# Patient Record
Sex: Female | Born: 1950
Health system: Southern US, Community
[De-identification: ages and names within clinical notes are randomized; demographics above are authoritative.]

## PROBLEM LIST (undated history)

## (undated) DIAGNOSIS — L8990 Pressure ulcer of unspecified site, unspecified stage: Secondary | ICD-10-CM

## (undated) DIAGNOSIS — M722 Plantar fascial fibromatosis: Secondary | ICD-10-CM

## (undated) DIAGNOSIS — M069 Rheumatoid arthritis, unspecified: Secondary | ICD-10-CM

## (undated) DIAGNOSIS — K219 Gastro-esophageal reflux disease without esophagitis: Secondary | ICD-10-CM

## (undated) DIAGNOSIS — I872 Venous insufficiency (chronic) (peripheral): Secondary | ICD-10-CM

## (undated) DIAGNOSIS — J342 Deviated nasal septum: Secondary | ICD-10-CM

## (undated) DIAGNOSIS — H11152 Pinguecula, left eye: Secondary | ICD-10-CM

## (undated) DIAGNOSIS — N390 Urinary tract infection, site not specified: Secondary | ICD-10-CM

## (undated) DIAGNOSIS — I10 Essential (primary) hypertension: Secondary | ICD-10-CM

## (undated) DIAGNOSIS — N289 Disorder of kidney and ureter, unspecified: Secondary | ICD-10-CM

## (undated) DIAGNOSIS — H15113 Episcleritis periodica fugax, bilateral: Secondary | ICD-10-CM

## (undated) DIAGNOSIS — M4690 Unspecified inflammatory spondylopathy, site unspecified: Secondary | ICD-10-CM

## (undated) HISTORY — DX: Episcleritis periodica fugax, bilateral: H15.113

## (undated) HISTORY — DX: Rheumatoid arthritis, unspecified: M06.9

## (undated) HISTORY — PX: OTHER SURGICAL HISTORY: SHX169

## (undated) HISTORY — DX: Disorder of kidney and ureter, unspecified: N28.9

## (undated) HISTORY — DX: Plantar fascial fibromatosis: M72.2

## (undated) HISTORY — DX: Unspecified inflammatory spondylopathy, site unspecified: M46.90

## (undated) HISTORY — DX: Venous insufficiency (chronic) (peripheral): I87.2

## (undated) HISTORY — DX: Pressure ulcer of unspecified site, unspecified stage: L89.90

## (undated) HISTORY — DX: Pinguecula, left eye: H11.152

## (undated) HISTORY — PX: BIOPSY BREAST: PRO8

---

## 2013-02-24 ENCOUNTER — Encounter (HOSPITAL_COMMUNITY): Payer: Self-pay | Admitting: Pharmacy Technician

## 2013-03-01 ENCOUNTER — Other Ambulatory Visit (HOSPITAL_COMMUNITY): Payer: Self-pay | Admitting: Orthopedic Surgery

## 2013-03-01 NOTE — Patient Instructions (Addendum)
[place]  03/01/2013   Your procedure is scheduled on: Tuesday January 13th  Report to [facility] at 1235  pm  Call this number if you have problems the morning of surgery [phone]   Remember:   Do not eat food  :After Midnight.   clear liquids midnight night before surgery until 935 am day of surgery, then nothing by mouth   Take these medicines the morning of surgery with A SIP OF WATER:  No meds to take                                [place] may not have any metal on your body including hair pins and piercings  Do not wear jewelry, make-up.  Do not wear lotions, powders, or perfumes. You may wear deodorant.   Men may shave face and neck.  Do not bring valuables to the hospital. [place].  Contacts, dentures or bridgework may not be worn into surgery.  Leave suitcase in the car. After surgery it may be brought to your room.  For patients admitted to the hospital, checkout time is 11:00 AM the day of discharge.     Please read over the following fact sheets that you were given: mrsa information, blood fact sheet, clear liquid sheet, incentive spirometer sheet  Call Zelphia Cairo RN pre op nurse if needed [phone]    [place].  PATIENT SIGNATURE___________________________________________  NURSE SIGNATURE_____________________________________________

## 2013-03-02 ENCOUNTER — Encounter (HOSPITAL_COMMUNITY)
Admission: RE | Admit: 2013-03-02 | Discharge: 2013-03-02 | Disposition: A | Discharge status: Home / Self Care | Payer: BC Managed Care – PPO | Source: Ambulatory Visit | Attending: Orthopedic Surgery | Admitting: Orthopedic Surgery

## 2013-03-02 ENCOUNTER — Ambulatory Visit (HOSPITAL_COMMUNITY)
Admission: RE | Admit: 2013-03-02 | Discharge: 2013-03-02 | Disposition: A | Discharge status: Home / Self Care | Payer: BC Managed Care – PPO | Source: Ambulatory Visit | Attending: Orthopedic Surgery | Admitting: Orthopedic Surgery

## 2013-03-02 ENCOUNTER — Encounter (HOSPITAL_COMMUNITY): Payer: Self-pay

## 2013-03-02 DIAGNOSIS — I10 Essential (primary) hypertension: Secondary | ICD-10-CM | POA: Insufficient documentation

## 2013-03-02 DIAGNOSIS — M47814 Spondylosis without myelopathy or radiculopathy, thoracic region: Secondary | ICD-10-CM | POA: Insufficient documentation

## 2013-03-02 DIAGNOSIS — Z01810 Encounter for preprocedural cardiovascular examination: Secondary | ICD-10-CM | POA: Insufficient documentation

## 2013-03-02 DIAGNOSIS — R918 Other nonspecific abnormal finding of lung field: Secondary | ICD-10-CM | POA: Insufficient documentation

## 2013-03-02 DIAGNOSIS — Z01818 Encounter for other preprocedural examination: Secondary | ICD-10-CM | POA: Insufficient documentation

## 2013-03-02 DIAGNOSIS — Z01812 Encounter for preprocedural laboratory examination: Secondary | ICD-10-CM | POA: Insufficient documentation

## 2013-03-02 DIAGNOSIS — N390 Urinary tract infection, site not specified: Secondary | ICD-10-CM

## 2013-03-02 HISTORY — DX: Urinary tract infection, site not specified: N39.0

## 2013-03-02 HISTORY — DX: Deviated nasal septum: J34.2

## 2013-03-02 HISTORY — DX: Gastro-esophageal reflux disease without esophagitis: K21.9

## 2013-03-02 HISTORY — DX: Essential (primary) hypertension: I10

## 2013-03-02 LAB — BASIC METABOLIC PANEL
BUN: 38 mg/dL — ABNORMAL HIGH (ref 6–23)
CALCIUM: 9.6 mg/dL (ref 8.4–10.5)
CO2: 24 mEq/L (ref 19–32)
CREATININE: 1.41 mg/dL — AB (ref 0.50–1.10)
Chloride: 102 mEq/L (ref 96–112)
GFR calc Af Amer: 45 mL/min — ABNORMAL LOW (ref >90)
GFR, EST NON AFRICAN AMERICAN: 39 mL/min — AB (ref >90)
Glucose, Bld: 108 mg/dL — ABNORMAL HIGH (ref 70–99)
Potassium: 4.9 mEq/L (ref 3.7–5.3)
SODIUM: 139 meq/L (ref 137–147)

## 2013-03-02 LAB — CBC
HCT: 33.6 % — ABNORMAL LOW (ref 36.0–46.0)
Hemoglobin: 10.7 g/dL — ABNORMAL LOW (ref 12.0–15.0)
MCH: 28 pg (ref 26.0–34.0)
MCHC: 31.8 g/dL (ref 30.0–36.0)
MCV: 88 fL (ref 78.0–100.0)
PLATELETS: 548 10*3/uL — AB (ref 150–400)
RBC: 3.82 MIL/uL — ABNORMAL LOW (ref 3.87–5.11)
RDW: 14.4 % (ref 11.5–15.5)
WBC: 12.7 10*3/uL — AB (ref 4.0–10.5)

## 2013-03-02 LAB — URINALYSIS, ROUTINE W REFLEX MICROSCOPIC
Bilirubin Urine: NEGATIVE
Glucose, UA: NEGATIVE mg/dL
Hgb urine dipstick: NEGATIVE
KETONES UR: NEGATIVE mg/dL
NITRITE: NEGATIVE
PH: 7 (ref 5.0–8.0)
PROTEIN: NEGATIVE mg/dL
Specific Gravity, Urine: 1.02 (ref 1.005–1.030)
Urobilinogen, UA: 0.2 mg/dL (ref 0.0–1.0)

## 2013-03-02 LAB — SURGICAL PCR SCREEN
MRSA, PCR: NEGATIVE
Staphylococcus aureus: NEGATIVE

## 2013-03-02 LAB — ABO/RH: ABO/RH(D): O POS

## 2013-03-02 LAB — URINE MICROSCOPIC-ADD ON

## 2013-03-02 LAB — PROTIME-INR
INR: 1.06 (ref 0.00–1.49)
Prothrombin Time: 13.6 seconds (ref 11.6–15.2)

## 2013-03-02 LAB — APTT: aPTT: 30 seconds (ref 24–37)

## 2013-03-02 NOTE — Progress Notes (Signed)
03/02/13 0924  OBSTRUCTIVE SLEEP APNEA  Have you ever been diagnosed with sleep apnea through a sleep study? No  Do you snore loudly (loud enough to be heard through closed doors)?  0  Do you often feel tired, fatigued, or sleepy during the daytime? 0  Has anyone observed you stop breathing during your sleep? 0  Do you have, or are you being treated for high blood pressure? 1  BMI more than 35 kg/m2? 1  Age over 64 years old? 1  Neck circumference greater than 40 cm/18 inches? 0  Gender: 1  Obstructive Sleep Apnea Score 4  Score 4 or greater  Results sent to PCP

## 2013-03-02 NOTE — Progress Notes (Signed)
Fax received and placed on pt chart cipro 500 mg x 3 days called in to [facility]

## 2013-03-02 NOTE — Progress Notes (Signed)
Micro, ua, bmet results faxed to dr Alvan Dame by epic

## 2013-03-05 ENCOUNTER — Encounter (HOSPITAL_COMMUNITY): Payer: Self-pay

## 2013-03-05 NOTE — Progress Notes (Addendum)
Medical clearance note d [name] [name]  on chart for 03-09-12 surgery, pt aware surgery time changed to 1500, arrive 1200 pm 03-09-12 [facility] short stay, npo after midnight except clear liquids midnight until 900 am, then npo day of surgery.

## 2013-03-07 NOTE — H&P (Signed)
TOTAL HIP ADMISSION H&P  Patient is admitted for right total hip arthroplasty, anterior approach.  Subjective:  Chief Complaint: Right hip OA / pain  HPI: [name] [name], 63 y.o. female, has a history of pain and functional disability in the right hip(s) due to arthritis and patient has failed non-surgical conservative treatments for greater than 12 weeks to include NSAID's and/or analgesics, use of assistive devices and activity modification.  Onset of symptoms was gradual starting >10 years ago with gradually worsening course since that time.The patient noted no past surgery on the right hip(s).  Patient currently rates pain in the right hip at 7 out of 10 with activity. Patient has worsening of pain with activity and weight bearing, trendelenberg gait, pain that interfers with activities of daily living and pain with passive range of motion. Patient has evidence of periarticular osteophytes and joint space narrowing by imaging studies. This condition presents safety issues increasing the risk of falls.  There is no current active infection.  Risks, benefits and expectations were discussed with the patient.  Risks including but not limited to the risk of anesthesia, blood clots, nerve damage, blood vessel damage, failure of the prosthesis, infection and up to and including death.  Patient understand the risks, benefits and expectations and wishes to proceed with surgery.   D/C Plans:   Home with HHPT  Post-op Meds:    No Rx given  Tranexamic Acid:   To be given  Decadron:    To be given  FYI:    ASA post-op  Norco post-op    Past Medical History  Diagnosis Date  . Hypertension   . Arthritis     oa and ra in arms  . GERD (gastroesophageal reflux disease)   . Deviated nasal septum     right side, cannot breathe out of right septum    Past Surgical History  Procedure Laterality Date  . Ingrown toenail removed  5-6 yrs ago    No prescriptions prior to admission   Allergies   Allergen Reactions  . Methotrexate Derivatives Shortness Of Breath  . Neosporin [Neomycin-Bacitracin Zn-Polymyx] Rash  . Sulfa Antibiotics Itching and Rash    History  Substance Use Topics  . Smoking status: Former Smoker -- 1.50 packs/day for 20 years    Types: Cigarettes    Quit date: 02/26/1988  . Smokeless tobacco: Never Used  . Alcohol Use: Yes     Comment: very rare       Review of Systems  Constitutional: Negative.   HENT: Negative.   Eyes: Negative.   Respiratory: Negative.   Cardiovascular: Negative.   Gastrointestinal: Positive for heartburn.  Genitourinary: Negative.   Musculoskeletal: Positive for joint pain.  Skin: Negative.   Neurological: Negative.   Endo/Heme/Allergies: Positive for environmental allergies.  Psychiatric/Behavioral: Negative.     Objective:  Physical Exam  Constitutional: She is oriented to person, place, and time. She appears well-developed and well-nourished.  HENT:  Head: Normocephalic and atraumatic.  Mouth/Throat: Oropharynx is clear and moist.  Eyes: Pupils are equal, round, and reactive to light.  Neck: Neck supple. No JVD present. No tracheal deviation present. No thyromegaly present.  Cardiovascular: Normal rate, regular rhythm, normal heart sounds and intact distal pulses.   Respiratory: Effort normal and breath sounds normal. No stridor. No respiratory distress. She has no wheezes.  GI: Soft. There is no tenderness. There is no guarding.  Musculoskeletal:       Right hip: She exhibits decreased range of motion, decreased  strength, tenderness and bony tenderness. She exhibits no swelling, no deformity and no laceration.  Lymphadenopathy:    She has no cervical adenopathy.  Neurological: She is alert and oriented to person, place, and time.  Skin: Skin is warm and dry.  Psychiatric: She has a normal mood and affect.      Imaging Review Plain radiographs demonstrate severe degenerative joint disease of the right hip(s).  The bone quality appears to be good for age and reported activity level.  Assessment/Plan:  End stage arthritis, right hip(s)  The patient history, physical examination, clinical judgement of the provider and imaging studies are consistent with end stage degenerative joint disease of the right hip(s) and total hip arthroplasty is deemed medically necessary. The treatment options including medical management, injection therapy, arthroscopy and arthroplasty were discussed at length. The risks and benefits of total hip arthroplasty were presented and reviewed. The risks due to aseptic loosening, infection, stiffness, dislocation/subluxation,  thromboembolic complications and other imponderables were discussed.  The patient acknowledged the explanation, agreed to proceed with the plan and consent was signed. Patient is being admitted for inpatient treatment for surgery, pain control, PT, OT, prophylactic antibiotics, VTE prophylaxis, progressive ambulation and ADL's and discharge planning.The patient is planning to be discharged home with home health services.     West Pugh [name]   PAC  03/07/2013, 8:06 PM

## 2013-03-09 ENCOUNTER — Encounter (HOSPITAL_COMMUNITY): Payer: BC Managed Care – PPO | Admitting: Certified Registered"

## 2013-03-09 ENCOUNTER — Inpatient Hospital Stay (HOSPITAL_COMMUNITY): Payer: BC Managed Care – PPO

## 2013-03-09 ENCOUNTER — Inpatient Hospital Stay (HOSPITAL_COMMUNITY)
Admission: RE | Admit: 2013-03-09 | Discharge: 2013-03-11 | DRG: 470 | Disposition: A | Discharge status: Home Health | Payer: BC Managed Care – PPO | Source: Ambulatory Visit | Attending: Orthopedic Surgery | Admitting: Orthopedic Surgery

## 2013-03-09 ENCOUNTER — Inpatient Hospital Stay (HOSPITAL_COMMUNITY): Payer: BC Managed Care – PPO | Admitting: Certified Registered"

## 2013-03-09 ENCOUNTER — Encounter (HOSPITAL_COMMUNITY): Payer: Self-pay | Admitting: *Deleted

## 2013-03-09 ENCOUNTER — Encounter (HOSPITAL_COMMUNITY)
Admission: RE | Disposition: A | Discharge status: Home Health | Payer: Self-pay | Source: Ambulatory Visit | Attending: Orthopedic Surgery

## 2013-03-09 DIAGNOSIS — D62 Acute posthemorrhagic anemia: Secondary | ICD-10-CM | POA: Diagnosis not present

## 2013-03-09 DIAGNOSIS — Z6841 Body Mass Index (BMI) 40.0 and over, adult: Secondary | ICD-10-CM

## 2013-03-09 DIAGNOSIS — Z87891 Personal history of nicotine dependence: Secondary | ICD-10-CM

## 2013-03-09 DIAGNOSIS — I10 Essential (primary) hypertension: Secondary | ICD-10-CM | POA: Diagnosis present

## 2013-03-09 DIAGNOSIS — M1610 Unilateral primary osteoarthritis, unspecified hip: Principal | ICD-10-CM | POA: Diagnosis present

## 2013-03-09 DIAGNOSIS — M25559 Pain in unspecified hip: Secondary | ICD-10-CM | POA: Diagnosis present

## 2013-03-09 DIAGNOSIS — M169 Osteoarthritis of hip, unspecified: Principal | ICD-10-CM | POA: Diagnosis present

## 2013-03-09 DIAGNOSIS — D500 Iron deficiency anemia secondary to blood loss (chronic): Secondary | ICD-10-CM | POA: Diagnosis not present

## 2013-03-09 DIAGNOSIS — Z96649 Presence of unspecified artificial hip joint: Secondary | ICD-10-CM

## 2013-03-09 DIAGNOSIS — K219 Gastro-esophageal reflux disease without esophagitis: Secondary | ICD-10-CM | POA: Diagnosis present

## 2013-03-09 HISTORY — DX: Urinary tract infection, site not specified: N39.0

## 2013-03-09 HISTORY — PX: TOTAL HIP ARTHROPLASTY: SHX124

## 2013-03-09 LAB — TYPE AND SCREEN
ABO/RH(D): O POS
Antibody Screen: NEGATIVE

## 2013-03-09 SURGERY — ARTHROPLASTY, HIP, TOTAL, ANTERIOR APPROACH
Anesthesia: Spinal | Site: Hip | Laterality: Right

## 2013-03-09 MED ORDER — 0.9 % SODIUM CHLORIDE (POUR BTL) OPTIME
TOPICAL | Status: DC | PRN
  Administered 2013-03-09: 1000 mL

## 2013-03-09 MED ORDER — METOCLOPRAMIDE HCL 10 MG PO TABS: 5.0000 mg | ORAL_TABLET | Freq: Three times a day (TID) | ORAL | Status: DC | PRN

## 2013-03-09 MED ORDER — FENTANYL CITRATE 0.05 MG/ML IJ SOLN
INTRAMUSCULAR | Status: AC
  Filled 2013-03-09: qty 2

## 2013-03-09 MED ORDER — FENTANYL CITRATE 0.05 MG/ML IJ SOLN
INTRAMUSCULAR | Status: DC | PRN
  Administered 2013-03-09: 100 ug via INTRAVENOUS

## 2013-03-09 MED ORDER — ATENOLOL 50 MG PO TABS
50.0000 mg | ORAL_TABLET | Freq: Every evening | ORAL | Status: DC
  Administered 2013-03-10: 50 mg via ORAL
  Filled 2013-03-09 (×3): qty 1

## 2013-03-09 MED ORDER — DEXAMETHASONE SODIUM PHOSPHATE 10 MG/ML IJ SOLN
10.0000 mg | Freq: Once | INTRAMUSCULAR | Status: DC
  Filled 2013-03-09: qty 1

## 2013-03-09 MED ORDER — ONDANSETRON HCL 4 MG/2ML IJ SOLN
INTRAMUSCULAR | Status: AC
  Filled 2013-03-09: qty 2

## 2013-03-09 MED ORDER — CEFAZOLIN SODIUM-DEXTROSE 2-3 GM-% IV SOLR
2.0000 g | INTRAVENOUS | Status: AC
  Administered 2013-03-09: 2 g via INTRAVENOUS

## 2013-03-09 MED ORDER — ONDANSETRON HCL 4 MG PO TABS: 4.0000 mg | ORAL_TABLET | Freq: Four times a day (QID) | ORAL | Status: DC | PRN

## 2013-03-09 MED ORDER — FLEET ENEMA 7-19 GM/118ML RE ENEM: 1.0000 | ENEMA | Freq: Once | RECTAL | Status: AC | PRN

## 2013-03-09 MED ORDER — POLYETHYLENE GLYCOL 3350 17 G PO PACK
17.0000 g | PACK | Freq: Two times a day (BID) | ORAL | Status: DC
  Administered 2013-03-10 (×2): 17 g via ORAL
  Filled 2013-03-09 (×5): qty 1

## 2013-03-09 MED ORDER — BUPIVACAINE HCL (PF) 0.5 % IJ SOLN
INTRAMUSCULAR | Status: AC
  Filled 2013-03-09: qty 30

## 2013-03-09 MED ORDER — ALUM & MAG HYDROXIDE-SIMETH 200-200-20 MG/5ML PO SUSP: 30.0000 mL | ORAL | Status: DC | PRN

## 2013-03-09 MED ORDER — ONDANSETRON HCL 4 MG/2ML IJ SOLN: 4.0000 mg | Freq: Four times a day (QID) | INTRAMUSCULAR | Status: DC | PRN

## 2013-03-09 MED ORDER — MENTHOL 3 MG MT LOZG: 1.0000 | LOZENGE | OROMUCOSAL | Status: DC | PRN

## 2013-03-09 MED ORDER — CEFAZOLIN SODIUM-DEXTROSE 2-3 GM-% IV SOLR
INTRAVENOUS | Status: AC
  Filled 2013-03-09: qty 50

## 2013-03-09 MED ORDER — TRANEXAMIC ACID 100 MG/ML IV SOLN
1000.0000 mg | Freq: Once | INTRAVENOUS | Status: AC
  Administered 2013-03-09: 1000 mg via INTRAVENOUS
  Filled 2013-03-09: qty 10

## 2013-03-09 MED ORDER — MIDAZOLAM HCL 2 MG/2ML IJ SOLN
INTRAMUSCULAR | Status: AC
  Filled 2013-03-09: qty 2

## 2013-03-09 MED ORDER — HYDROMORPHONE HCL PF 1 MG/ML IJ SOLN: 0.2500 mg | INTRAMUSCULAR | Status: DC | PRN

## 2013-03-09 MED ORDER — METHOCARBAMOL 100 MG/ML IJ SOLN
500.0000 mg | Freq: Four times a day (QID) | INTRAVENOUS | Status: DC | PRN
  Administered 2013-03-09: 500 mg via INTRAVENOUS
  Filled 2013-03-09: qty 5

## 2013-03-09 MED ORDER — PROPOFOL 10 MG/ML IV BOLUS
INTRAVENOUS | Status: DC | PRN
  Administered 2013-03-09: 20 mg via INTRAVENOUS

## 2013-03-09 MED ORDER — DIPHENHYDRAMINE HCL 25 MG PO CAPS
25.0000 mg | ORAL_CAPSULE | Freq: Four times a day (QID) | ORAL | Status: DC | PRN
  Administered 2013-03-11: 25 mg via ORAL
  Filled 2013-03-09: qty 1

## 2013-03-09 MED ORDER — OXYCODONE HCL 5 MG PO TABS: 5.0000 mg | ORAL_TABLET | Freq: Once | ORAL | Status: DC | PRN

## 2013-03-09 MED ORDER — METOCLOPRAMIDE HCL 5 MG/ML IJ SOLN
5.0000 mg | Freq: Three times a day (TID) | INTRAMUSCULAR | Status: DC | PRN
  Filled 2013-03-09: qty 2

## 2013-03-09 MED ORDER — CEFAZOLIN SODIUM-DEXTROSE 2-3 GM-% IV SOLR
2.0000 g | Freq: Four times a day (QID) | INTRAVENOUS | Status: AC
  Administered 2013-03-09 – 2013-03-10 (×2): 2 g via INTRAVENOUS
  Filled 2013-03-09 (×3): qty 50

## 2013-03-09 MED ORDER — ZOLPIDEM TARTRATE 5 MG PO TABS: 5.0000 mg | ORAL_TABLET | Freq: Every evening | ORAL | Status: DC | PRN

## 2013-03-09 MED ORDER — DEXAMETHASONE SODIUM PHOSPHATE 10 MG/ML IJ SOLN
INTRAMUSCULAR | Status: DC | PRN
  Administered 2013-03-09: 10 mg via INTRAVENOUS

## 2013-03-09 MED ORDER — MEPERIDINE HCL 50 MG/ML IJ SOLN: 6.2500 mg | INTRAMUSCULAR | Status: DC | PRN

## 2013-03-09 MED ORDER — SODIUM CHLORIDE 0.9 % IV SOLN
100.0000 mL/h | INTRAVENOUS | Status: DC
  Administered 2013-03-09: 100 mL/h via INTRAVENOUS
  Filled 2013-03-09 (×7): qty 1000

## 2013-03-09 MED ORDER — ONDANSETRON HCL 4 MG/2ML IJ SOLN
INTRAMUSCULAR | Status: DC | PRN
  Administered 2013-03-09: 4 mg via INTRAVENOUS

## 2013-03-09 MED ORDER — LACTATED RINGERS IV SOLN
INTRAVENOUS | Status: DC | PRN
  Administered 2013-03-09: 14:00:00 via INTRAVENOUS

## 2013-03-09 MED ORDER — MIDAZOLAM HCL 5 MG/5ML IJ SOLN
INTRAMUSCULAR | Status: DC | PRN
  Administered 2013-03-09: 2 mg via INTRAVENOUS

## 2013-03-09 MED ORDER — BISACODYL 10 MG RE SUPP: 10.0000 mg | Freq: Every day | RECTAL | Status: DC | PRN

## 2013-03-09 MED ORDER — DOCUSATE SODIUM 100 MG PO CAPS
100.0000 mg | ORAL_CAPSULE | Freq: Two times a day (BID) | ORAL | Status: DC
  Administered 2013-03-09 – 2013-03-11 (×4): 100 mg via ORAL
  Filled 2013-03-09 (×5): qty 1

## 2013-03-09 MED ORDER — LACTATED RINGERS IV SOLN: INTRAVENOUS | Status: DC

## 2013-03-09 MED ORDER — OXYCODONE HCL 5 MG/5ML PO SOLN
5.0000 mg | Freq: Once | ORAL | Status: DC | PRN
  Filled 2013-03-09: qty 5

## 2013-03-09 MED ORDER — FERROUS SULFATE 325 (65 FE) MG PO TABS
325.0000 mg | ORAL_TABLET | Freq: Three times a day (TID) | ORAL | Status: DC
  Administered 2013-03-09 – 2013-03-11 (×5): 325 mg via ORAL
  Filled 2013-03-09 (×8): qty 1

## 2013-03-09 MED ORDER — PROPOFOL 10 MG/ML IV BOLUS
INTRAVENOUS | Status: AC
  Filled 2013-03-09: qty 20

## 2013-03-09 MED ORDER — PHENYLEPHRINE HCL 10 MG/ML IJ SOLN
INTRAMUSCULAR | Status: AC
  Filled 2013-03-09: qty 1

## 2013-03-09 MED ORDER — DEXAMETHASONE SODIUM PHOSPHATE 10 MG/ML IJ SOLN: 10.0000 mg | Freq: Once | INTRAMUSCULAR | Status: DC

## 2013-03-09 MED ORDER — HYDROMORPHONE HCL PF 1 MG/ML IJ SOLN: 0.5000 mg | INTRAMUSCULAR | Status: DC | PRN

## 2013-03-09 MED ORDER — BUPIVACAINE HCL (PF) 0.5 % IJ SOLN
INTRAMUSCULAR | Status: DC | PRN
  Administered 2013-03-09: 3 mL

## 2013-03-09 MED ORDER — PROMETHAZINE HCL 25 MG/ML IJ SOLN: 6.2500 mg | INTRAMUSCULAR | Status: DC | PRN

## 2013-03-09 MED ORDER — PROPOFOL INFUSION 10 MG/ML OPTIME
INTRAVENOUS | Status: DC | PRN
  Administered 2013-03-09: 50 ug/kg/min via INTRAVENOUS

## 2013-03-09 MED ORDER — DIPHENHYDRAMINE HCL 25 MG PO TABS
25.0000 mg | ORAL_TABLET | Freq: Four times a day (QID) | ORAL | Status: DC | PRN
  Filled 2013-03-09: qty 1

## 2013-03-09 MED ORDER — PHENOL 1.4 % MT LIQD: 1.0000 | OROMUCOSAL | Status: DC | PRN

## 2013-03-09 MED ORDER — DEXAMETHASONE SODIUM PHOSPHATE 10 MG/ML IJ SOLN
INTRAMUSCULAR | Status: AC
  Filled 2013-03-09: qty 1

## 2013-03-09 MED ORDER — METHOCARBAMOL 500 MG PO TABS
500.0000 mg | ORAL_TABLET | Freq: Four times a day (QID) | ORAL | Status: DC | PRN
  Administered 2013-03-10: 500 mg via ORAL
  Filled 2013-03-09: qty 1

## 2013-03-09 MED ORDER — PHENYLEPHRINE HCL 10 MG/ML IJ SOLN
10.0000 mg | INTRAVENOUS | Status: DC | PRN
  Administered 2013-03-09: 50 ug/min via INTRAVENOUS

## 2013-03-09 MED ORDER — ASPIRIN EC 325 MG PO TBEC
325.0000 mg | DELAYED_RELEASE_TABLET | Freq: Two times a day (BID) | ORAL | Status: DC
  Administered 2013-03-10 – 2013-03-11 (×3): 325 mg via ORAL
  Filled 2013-03-09 (×5): qty 1

## 2013-03-09 MED ORDER — HYDROCODONE-ACETAMINOPHEN 7.5-325 MG PO TABS
1.0000 | ORAL_TABLET | ORAL | Status: DC
  Administered 2013-03-09: 2 via ORAL
  Administered 2013-03-09 – 2013-03-11 (×10): 1 via ORAL
  Filled 2013-03-09 (×5): qty 1
  Filled 2013-03-09: qty 2
  Filled 2013-03-09 (×3): qty 1
  Filled 2013-03-09: qty 2
  Filled 2013-03-09: qty 1

## 2013-03-09 SURGICAL SUPPLY — 39 items
BAG ZIPLOCK 12X15 (MISCELLANEOUS) IMPLANT
BLADE SAW SGTL 18X1.27X75 (BLADE) ×2 IMPLANT
BLADE SAW SGTL 18X1.27X75MM (BLADE) ×1
CAPT HIP PF COP ×3 IMPLANT
DERMABOND ADVANCED (GAUZE/BANDAGES/DRESSINGS) ×2
DERMABOND ADVANCED .7 DNX12 (GAUZE/BANDAGES/DRESSINGS) ×1 IMPLANT
DRAPE C-ARM 42X120 X-RAY (DRAPES) ×3 IMPLANT
DRAPE POUCH INSTRU U-SHP 10X18 (DRAPES) ×3 IMPLANT
DRAPE STERI IOBAN 125X83 (DRAPES) ×3 IMPLANT
DRAPE U-SHAPE 47X51 STRL (DRAPES) ×9 IMPLANT
DRSG AQUACEL AG ADV 3.5X10 (GAUZE/BANDAGES/DRESSINGS) ×3 IMPLANT
DRSG TEGADERM 4X4.75 (GAUZE/BANDAGES/DRESSINGS) IMPLANT
DURAPREP 26ML APPLICATOR (WOUND CARE) ×3 IMPLANT
ELECT BLADE TIP CTD 4 INCH (ELECTRODE) ×3 IMPLANT
ELECT REM PT RETURN 9FT ADLT (ELECTROSURGICAL) ×3
ELECTRODE REM PT RTRN 9FT ADLT (ELECTROSURGICAL) ×1 IMPLANT
EVACUATOR 1/8 PVC DRAIN (DRAIN) IMPLANT
FACESHIELD LNG OPTICON STERILE (SAFETY) ×12 IMPLANT
GAUZE SPONGE 2X2 8PLY STRL LF (GAUZE/BANDAGES/DRESSINGS) IMPLANT
GLOVE BIOGEL PI IND STRL 7.5 (GLOVE) ×1 IMPLANT
GLOVE BIOGEL PI IND STRL 8 (GLOVE) ×1 IMPLANT
GLOVE BIOGEL PI INDICATOR 7.5 (GLOVE) ×2
GLOVE BIOGEL PI INDICATOR 8 (GLOVE) ×2
GLOVE ECLIPSE 8.0 STRL XLNG CF (GLOVE) ×6 IMPLANT
GLOVE ORTHO TXT STRL SZ7.5 (GLOVE) ×6 IMPLANT
GOWN SPEC L3 XXLG W/TWL (GOWN DISPOSABLE) ×3 IMPLANT
GOWN STRL REUS W/TWL LRG LVL3 (GOWN DISPOSABLE) ×6 IMPLANT
KIT BASIN OR (CUSTOM PROCEDURE TRAY) ×3 IMPLANT
PACK TOTAL JOINT (CUSTOM PROCEDURE TRAY) ×3 IMPLANT
PADDING CAST COTTON 6X4 STRL (CAST SUPPLIES) ×3 IMPLANT
SPONGE GAUZE 2X2 STER 10/PKG (GAUZE/BANDAGES/DRESSINGS)
SUT MNCRL AB 4-0 PS2 18 (SUTURE) ×3 IMPLANT
SUT VIC AB 1 CT1 36 (SUTURE) ×9 IMPLANT
SUT VIC AB 2-0 CT1 27 (SUTURE) ×4
SUT VIC AB 2-0 CT1 TAPERPNT 27 (SUTURE) ×2 IMPLANT
SUT VLOC 180 0 24IN GS25 (SUTURE) ×3 IMPLANT
TOWEL OR 17X26 10 PK STRL BLUE (TOWEL DISPOSABLE) ×3 IMPLANT
TOWEL OR NON WOVEN STRL DISP B (DISPOSABLE) ×3 IMPLANT
TRAY FOLEY CATH 14FRSI W/METER (CATHETERS) ×3 IMPLANT

## 2013-03-09 NOTE — Op Note (Signed)
NAME:  [name] [name]                ACCOUNT NO.: [id number]      MEDICAL RECORD NO.: [id number]      FACILITY:  [facility]      PHYSICIAN:  Paralee Cancel D  DATE OF BIRTH:  1950/12/24     DATE OF PROCEDURE:  03/09/2013                                 OPERATIVE REPORT         PREOPERATIVE DIAGNOSIS: Right  hip osteoarthritis.      POSTOPERATIVE DIAGNOSIS:  Right hip osteoarthritis.      PROCEDURE:  Right total hip replacement through an anterior approach   utilizing DePuy THR system, component size 23mm pinnacle cup, a size 32+4 neutral   Altrex liner, a size 2 Hi Tri Lock stem with a 32+1 delta ceramic   ball.      SURGEON:  Pietro Cassis. Alvan Dame, M.D.      ASSISTANT:  Danae Orleans, PA-C     ANESTHESIA:  Spinal.      SPECIMENS:  None.      COMPLICATIONS:  None.      BLOOD LOSS:  200 cc     DRAINS: None.      INDICATION OF THE PROCEDURE:  [name] [name] is a 64 y.o. female who had   presented to office for evaluation of right hip pain.  Radiographs revealed   progressive degenerative changes with bone-on-bone   articulation to the  hip joint.  The patient had painful limited range of   motion significantly affecting their overall quality of life.  The patient was failing to    respond to conservative measures, and at this point was ready   to proceed with more definitive measures.  The patient has noted progressive   degenerative changes in his hip, progressive problems and dysfunction   with regarding the hip prior to surgery.  Consent was obtained for   benefit of pain relief.  Specific risk of infection, DVT, component   failure, dislocation, need for revision surgery, as well discussion of   the anterior versus posterior approach were reviewed.  Consent was   obtained for benefit of anterior pain relief through an anterior   approach.      PROCEDURE IN DETAIL:  The patient was brought to operative theater.   Once adequate anesthesia, preoperative  antibiotics, 2gm Ancef administered.   The patient was positioned supine on the OSI Hanna table.  Once adequate   padding of boney process was carried out, we had predraped out the hip, and  used fluoroscopy to confirm orientation of the pelvis and position.      The right hip was then prepped and draped from proximal iliac crest to   mid thigh with shower curtain technique.      Time-out was performed identifying the patient, planned procedure, and   extremity.     An incision was then made 2 cm distal and lateral to the   anterior superior iliac spine extending over the orientation of the   tensor fascia lata muscle and sharp dissection was carried down to the   fascia of the muscle and protractor placed in the soft tissues.      The fascia was then incised.  The muscle belly was identified and swept   laterally and  retractor placed along the superior neck.  Following   cauterization of the circumflex vessels and removing some pericapsular   fat, a second cobra retractor was placed on the inferior neck.  A third   retractor was placed on the anterior acetabulum after elevating the   anterior rectus.  A L-capsulotomy was along the line of the   superior neck to the trochanteric fossa, then extended proximally and   distally.  Tag sutures were placed and the retractors were then placed   intracapsular.  We then identified the trochanteric fossa and   orientation of my neck cut, confirmed this radiographically   and then made a neck osteotomy with the femur on traction.  The femoral   head was removed without difficulty or complication.  Traction was let   off and retractors were placed posterior and anterior around the   acetabulum.      The labrum and foveal tissue were debrided.  I began reaming with a 53mm   reamer and reamed up to 61mm reamer with good bony bed preparation and a 50   cup was chosen.  The final 25mm Pinnacle cup was then impacted under fluoroscopy  to confirm the  depth of penetration and orientation with respect to   abduction.  A screw was placed followed by the hole eliminator.  The final   32+4 neutral Altrex liner was impacted with good visualized rim fit.  The cup was positioned anatomically within the acetabular portion of the pelvis.      At this point, the femur was rolled at 80 degrees.  Further capsule was   released off the inferior aspect of the femoral neck.  I then   released the superior capsule proximally.  The hook was placed laterally   along the femur and elevated manually and held in position with the bed   hook.  The leg was then extended and adducted with the leg rolled to 100   degrees of external rotation.  Once the proximal femur was fully   exposed, I used a box osteotome to set orientation.  I then began   broaching with the starting chili pepper broach and passed this by hand and then broached up to 2.  With the 2 broach in place I chose a high offset neck and did a trial reduction.  The offset was appropriate, leg lengths   appeared to be equal, confirmed radiographically.   Given these findings, I went ahead and dislocated the hip, repositioned all   retractors and positioned the right hip in the extended and abducted position.  The final 2 Hi Tri Lock stem was   chosen and it was impacted down to the level of neck cut.  Based on this   and the trial reduction, a 32+1 delta ceramic ball was chosen and   impacted onto a clean and dry trunnion, and the hip was reduced.  The   hip had been irrigated throughout the case again at this point.  I did   reapproximate the superior capsular leaflet to the anterior leaflet   using #1 Vicryl.  The fascia of the   tensor fascia lata muscle was then reapproximated using #1 Vicryl and #0 V-lock sutures.  The   remaining wound was closed with 2-0 Vicryl and running 4-0 Monocryl.   The hip was cleaned, dried, and dressed sterilely using Dermabond and   Aquacel dressing.  She was then  brought   to recovery room in stable  condition tolerating the procedure well.    Danae Orleans, PA-C was present for the entirety of the case involved from   preoperative positioning, perioperative retractor management, general   facilitation of the case, as well as primary wound closure as assistant.            Pietro Cassis Alvan Dame, M.D.        03/09/2013 3:44 PM

## 2013-03-09 NOTE — Anesthesia Procedure Notes (Signed)
Spinal  Patient location during procedure: OR End time: 03/09/2013 2:20 PM Staffing CRNA/Resident: Noralyn Pick Performed by: anesthesiologist and resident/CRNA  Preanesthetic Checklist Completed: patient identified, site marked, surgical consent, pre-op evaluation, timeout performed, IV checked, risks and benefits discussed and monitors and equipment checked Spinal Block Patient position: sitting Prep: Betadine Patient monitoring: heart rate, continuous pulse ox and blood pressure Approach: midline Location: L2-3 Injection technique: single-shot Needle Needle type: Sprotte and Tuohy  Needle gauge: 24 G Needle length: 9 cm Assessment Sensory level: T6 Additional Notes Expiration date of kit checked and confirmed. Patient tolerated procedure well, without complications.

## 2013-03-09 NOTE — Transfer of Care (Signed)
Immediate Anesthesia Transfer of Care Note  Patient: [name] [name]  Procedure(s) Performed: Procedure(s) (LRB): RIGHT TOTAL HIP ARTHROPLASTY ANTERIOR APPROACH (Right)  Patient Location: PACU  Anesthesia Type: Spinal  Level of Consciousness: sedated, patient cooperative and responds to stimulation  Airway & Oxygen Therapy: Patient Spontanous Breathing and Patient connected to face mask oxgen  Post-op Assessment: Report given to PACU RN and Post -op Vital signs reviewed and stable  Post vital signs: Reviewed and stable  Complications: No apparent anesthesia complications

## 2013-03-09 NOTE — Anesthesia Postprocedure Evaluation (Signed)
Anesthesia Post Note  Patient: [name] [name]  Procedure(s) Performed: Procedure(s) (LRB): RIGHT TOTAL HIP ARTHROPLASTY ANTERIOR APPROACH (Right)  Anesthesia type: Spinal  Patient location: PACU  Post pain: Pain level controlled  Post assessment: Post-op Vital signs reviewed  Last Vitals: BP 107/55  Pulse 55  Temp(Src) 36.1 C (Oral)  Resp 10  SpO2 100%  Post vital signs: Reviewed  Level of consciousness: sedated  Complications: No apparent anesthesia complications

## 2013-03-09 NOTE — Interval H&P Note (Signed)
History and Physical Interval Note:  03/09/2013 12:54 PM  [name] [name]  has presented today for surgery, with the diagnosis of right hip osteoarthritis  The various methods of treatment have been discussed with the patient and family. After consideration of risks, benefits and other options for treatment, the patient has consented to  Procedure(s): RIGHT TOTAL HIP ARTHROPLASTY ANTERIOR APPROACH (Right) as a surgical intervention .  The patient's history has been reviewed, patient examined, no change in status, stable for surgery.  I have reviewed the patient's chart and labs.  Questions were answered to the patient's satisfaction.     Mauri Pole

## 2013-03-09 NOTE — Anesthesia Preprocedure Evaluation (Signed)
Anesthesia Evaluation  Patient identified by MRN, date of birth, ID band Patient awake    Reviewed: Allergy & Precautions, H&P , NPO status , Patient's Chart, lab work & pertinent test results  Airway Mallampati: II TM Distance: >3 FB Neck ROM: Full    Dental  (+) Dental Advisory Given   Pulmonary former smoker,  breath sounds clear to auscultation        Cardiovascular hypertension, Pt. on medications negative cardio ROS  Rhythm:Regular Rate:Normal     Neuro/Psych negative neurological ROS  negative psych ROS   GI/Hepatic Neg liver ROS, GERD-  ,  Endo/Other  Morbid obesity  Renal/GU negative Renal ROS     Musculoskeletal negative musculoskeletal ROS (+)   Abdominal (+) + obese,   Peds  Hematology negative hematology ROS (+)   Anesthesia Other Findings   Reproductive/Obstetrics negative OB ROS                           Anesthesia Physical Anesthesia Plan  ASA: III  Anesthesia Plan:    Post-op Pain Management:    Induction:   Airway Management Planned:   Additional Equipment:   Intra-op Plan:   Post-operative Plan:   Informed Consent: I have reviewed the patients History and Physical, chart, labs and discussed the procedure including the risks, benefits and alternatives for the proposed anesthesia with the patient or authorized representative who has indicated his/her understanding and acceptance.   Dental advisory given  Plan Discussed with: CRNA  Anesthesia Plan Comments:         Anesthesia Quick Evaluation

## 2013-03-10 ENCOUNTER — Encounter (HOSPITAL_COMMUNITY): Payer: Self-pay | Admitting: Orthopedic Surgery

## 2013-03-10 DIAGNOSIS — D500 Iron deficiency anemia secondary to blood loss (chronic): Secondary | ICD-10-CM | POA: Diagnosis not present

## 2013-03-10 LAB — BASIC METABOLIC PANEL
BUN: 25 mg/dL — AB (ref 6–23)
CO2: 23 meq/L (ref 19–32)
Calcium: 8.6 mg/dL (ref 8.4–10.5)
Chloride: 103 mEq/L (ref 96–112)
Creatinine, Ser: 1.09 mg/dL (ref 0.50–1.10)
GFR calc Af Amer: 61 mL/min — ABNORMAL LOW (ref >90)
GFR, EST NON AFRICAN AMERICAN: 53 mL/min — AB (ref >90)
GLUCOSE: 132 mg/dL — AB (ref 70–99)
POTASSIUM: 4.6 meq/L (ref 3.7–5.3)
Sodium: 136 mEq/L — ABNORMAL LOW (ref 137–147)

## 2013-03-10 LAB — CBC
HEMATOCRIT: 29.1 % — AB (ref 36.0–46.0)
HEMOGLOBIN: 9.4 g/dL — AB (ref 12.0–15.0)
MCH: 28.1 pg (ref 26.0–34.0)
MCHC: 32.3 g/dL (ref 30.0–36.0)
MCV: 87.1 fL (ref 78.0–100.0)
Platelets: 404 10*3/uL — ABNORMAL HIGH (ref 150–400)
RBC: 3.34 MIL/uL — AB (ref 3.87–5.11)
RDW: 14.3 % (ref 11.5–15.5)
WBC: 14.6 10*3/uL — ABNORMAL HIGH (ref 4.0–10.5)

## 2013-03-10 NOTE — Progress Notes (Signed)
Physical Therapy Treatment Patient Details Name: [name] [name] MRN: [id number] DOB: Feb 22, 1951 Today's Date: 03/10/2013 Time: 1829-9371 PT Time Calculation (min): 23 min  PT Assessment / Plan / Recommendation  History of Present Illness pt was admitted with R DA THA.  She has a h/o RA   PT Comments     Follow Up Recommendations  Home health PT     Does the patient have the potential to tolerate intense rehabilitation     Barriers to Discharge        Equipment Recommendations  Rolling walker with 5" wheels    Recommendations for Other Services OT consult  Frequency 7X/week   Progress towards PT Goals Progress towards PT goals: Progressing toward goals  Plan Current plan remains appropriate    Precautions / Restrictions Precautions Precautions: Fall Precaution Comments: ltd use of hands 2* RA  Restrictions Weight Bearing Restrictions: No Other Position/Activity Restrictions: WBAT   Pertinent Vitals/Pain 4/10 with activity; premed, ice pack provided    Mobility  Bed Mobility Overal bed mobility: Needs Assistance Bed Mobility: Supine to Sit Supine to sit: Min guard;Min assist General bed mobility comments: Cues for sequence, use of L LE: Pt utilizing bed rail to assist - states she has one on bed at home Transfers Overall transfer level: Needs assistance Equipment used: Rolling walker (2 wheeled) Transfers: Sit to/from Stand Sit to Stand: Min assist General transfer comment: from chair, using rocking momentum.  cues for LE position Ambulation/Gait Ambulation/Gait assistance: Min assist;Min guard Ambulation Distance (Feet): 56 Feet Assistive device: Rolling walker (2 wheeled) Gait Pattern/deviations: Step-to pattern;Step-through pattern;Decreased step length - left;Decreased step length - right;Shuffle;Antalgic;Trunk flexed Gait velocity: decr General Gait Details: Cues for sequence, posture, position from RW.  Pt ltd by c/o discomfort bil hands - pt with RA in  hands    Exercises     PT Diagnosis:    PT Problem List:   PT Treatment Interventions:     PT Goals (current goals can now be found in the care plan section) Acute Rehab PT Goals Patient Stated Goal: get independent PT Goal Formulation: With patient Time For Goal Achievement: 03/17/13 Potential to Achieve Goals: Good  Visit Information  Last PT Received On: 03/10/13 Assistance Needed: +1 History of Present Illness: pt was admitted with R DA THA.  She has a h/o RA    Subjective Data  Subjective: I'm doing a lot better than this morning Patient Stated Goal: get independent   Cognition  Cognition Arousal/Alertness: Awake/alert Behavior During Therapy: WFL for tasks assessed/performed Overall Cognitive Status: Within Functional Limits for tasks assessed    Balance     End of Session PT - End of Session Equipment Utilized During Treatment: Gait belt Activity Tolerance: Patient tolerated treatment well Patient left: in chair;with call bell/phone within reach Nurse Communication: Mobility status   GP     [name] [name] 03/10/2013, 3:21 PM

## 2013-03-10 NOTE — Progress Notes (Signed)
   Subjective: 1 Day Post-Op Procedure(s) (LRB): RIGHT TOTAL HIP ARTHROPLASTY ANTERIOR APPROACH (Right)   Patient reports pain as mild, pain controlled. No events throughout the night. Working a little slowly with PT.  Depends on PT to when she would be able to be discharged.  Objective:   VITALS:   Filed Vitals:   03/10/13 1011  BP: 106/66  Pulse: 67  Temp: 97.7 F (36.5 C)  Resp: 18    Neurovascular intact Dorsiflexion/Plantar flexion intact Incision: dressing C/D/I No cellulitis present Compartment soft  LABS  Recent Labs  03/10/13 0415  HGB 9.4*  HCT 29.1*  WBC 14.6*  PLT 404*     Recent Labs  03/10/13 0415  NA 136*  K 4.6  BUN 25*  CREATININE 1.09  GLUCOSE 132*     Assessment/Plan: 1 Day Post-Op Procedure(s) (LRB): RIGHT TOTAL HIP ARTHROPLASTY ANTERIOR APPROACH (Right) Foley cath d/c'ed Advance diet Up with therapy D/C IV fluids Discharge home with home health eventually when ready  Expected ABLA  Treated with iron and will observe  Morbid Obesity (BMI >40)  Estimated body mass index is 44.07 kg/(m^2) as calculated from the following:   Height as of this encounter: 5\' 2"  (1.575 m).   Weight as of this encounter: 109.317 kg (241 lb). Patient also counseled that weight may inhibit the healing process Patient counseled that losing weight will help with future health issues      West Pugh. Maleek Craver   PAC  03/10/2013, 10:20 AM

## 2013-03-10 NOTE — Evaluation (Signed)
Occupational Therapy Evaluation Patient Details Name: [name] [name] MRN: [id number] DOB: 1950-11-12 Today's Date: 03/10/2013 Time: 6948-5462 OT Time Calculation (min): 32 min  OT Assessment / Plan / Recommendation History of present illness pt was admitted with R DA THA.  She has a h/o RA   Clinical Impression   Pt was admitted for the above surgery.  She will benefit from skilled OT to increase safety and independence with toileting and adls.      OT Assessment  Patient needs continued OT Services    Follow Up Recommendations  Home health OT    Barriers to Discharge      Equipment Recommendations  None recommended by OT (3:1 delivered)    Recommendations for Other Services    Frequency  Min 2X/week    Precautions / Restrictions Precautions Precautions: Fall Precaution Comments: ltd use of hands 2* RA  Restrictions Other Position/Activity Restrictions: WBAT   Pertinent Vitals/Pain RLE 5/10 with movement.  Repositioned with ice    ADL  Grooming: Set up Where Assessed - Grooming: Supported sitting Upper Body Bathing: Set up Where Assessed - Upper Body Bathing: Unsupported sitting Lower Body Bathing: Minimal assistance (with AE) Where Assessed - Lower Body Bathing: Supported sit to stand Upper Body Dressing: Minimal assistance Where Assessed - Upper Body Dressing: Unsupported sitting Lower Body Dressing: Moderate assistance (with AE) Where Assessed - Lower Body Dressing: Supported sit to Sales promotion account executive and Hygiene: Minimal assistance Where Assessed - Best boy and Hygiene: Sit to stand from 3-in-1 or toilet Equipment Used: Rolling walker Transfers/Ambulation Related to ADLs: sit to stand only ADL Comments: pt has long shoehorn which she uses for pants, dressing stick, sock aid at home.  She is concerned about ted hose and will not be able to use the device designed for that because of weakness/RA.  Educated on toilet aide  as she has difficulty with this when hands swell.  she has been using ice for edema.  also recommended and educated on positioning as well as making fist and opening fingers    OT Diagnosis: Generalized weakness  OT Problem List: Decreased strength;Decreased activity tolerance;Pain;Decreased knowledge of use of DME or AE OT Treatment Interventions: Self-care/ADL training;DME and/or AE instruction;Patient/family education   OT Goals(Current goals can be found in the care plan section) Acute Rehab OT Goals Patient Stated Goal: get independent OT Goal Formulation: With patient Time For Goal Achievement: 03/17/13 Potential to Achieve Goals: Good ADL Goals Pt Will Perform Grooming: with supervision;standing Pt Will Transfer to Toilet: with min guard assist;bedside commode;ambulating Pt Will Perform Toileting - Clothing Manipulation and hygiene: with supervision;sit to/from stand;with adaptive equipment  Visit Information  Last OT Received On: 03/10/13 Assistance Needed: +1 History of Present Illness: pt was admitted with R DA THA.  She has a h/o RA       Prior [place] expects to be discharged to:: Private residence Living Arrangements: Alone Available Help at Discharge: Family Home Equipment: Tub bench (hand held shower) Additional Comments: 3:1 delivered Prior Function Level of Independence: Independent with assistive device(s) Communication Communication: No difficulties Dominant Hand: Right         Vision/Perception     Cognition  Cognition Arousal/Alertness: Awake/alert Behavior During Therapy: WFL for tasks assessed/performed Overall Cognitive Status: Within Functional Limits for tasks assessed    Extremity/Trunk Assessment Upper Extremity Assessment Upper Extremity Assessment: Generalized weakness (variable ROM; can make loose fist)     Mobility Transfers  Overall transfer level: Needs assistance Equipment used: Rolling  walker (2 wheeled) Transfers: Sit to/from Stand Sit to Stand: Min assist General transfer comment: from chair, using rocking momentum.  cues for LE position     Exercise     Balance     End of Session OT - End of Session Activity Tolerance: Patient tolerated treatment well Patient left: in chair;with call bell/phone within reach  [place] 03/10/2013, 2:05 PM [name], OTR/L [phone] 03/10/2013

## 2013-03-10 NOTE — Progress Notes (Signed)
Utilization review completed.  

## 2013-03-10 NOTE — Progress Notes (Signed)
RN on 3W called report to receiving RN on 5W. All questions answered. Patient transferred.

## 2013-03-10 NOTE — Evaluation (Signed)
Physical Therapy Evaluation Patient Details Name: [name] [name] MRN: [id number] DOB: 1950-09-27 Today's Date: 03/10/2013 Time: 0623-7628 PT Time Calculation (min): 36 min  PT Assessment / Plan / Recommendation History of Present Illness     Clinical Impression  Pt s/p R THR presents with decreased R LE strength/ROM, post op pain, obesity and limited use of hands 2* RA limiting functional mobility.  Pt plans to d/c home with assist of sister in law    PT Assessment  Patient needs continued PT services    Follow Up Recommendations  Home health PT    Does the patient have the potential to tolerate intense rehabilitation      Barriers to Discharge        Equipment Recommendations  Rolling walker with 5" wheels (WIDE RW please)    Recommendations for Other Services OT consult   Frequency 7X/week    Precautions / Restrictions Precautions Precautions: Fall Precaution Comments: ltd use of hands 2* RA  Restrictions Weight Bearing Restrictions: No Other Position/Activity Restrictions: WBAT   Pertinent Vitals/Pain 2/10; premed, ice pack provided      Mobility  Bed Mobility Overal bed mobility: Needs Assistance Bed Mobility: Supine to Sit Supine to sit: Mod assist General bed mobility comments: Cues for sequence, use of L LE: physical assist for management of R LE and to bring trunk to upright Transfers Overall transfer level: Needs assistance Equipment used: Rolling walker (2 wheeled) Transfers: Sit to/from Stand Sit to Stand: Mod assist General transfer comment: cues for LE management and use of UEs to self assist. Bed elevated above height of home bed Ambulation/Gait Ambulation/Gait assistance: Mod assist Ambulation Distance (Feet): 18 Feet Assistive device: Rolling walker (2 wheeled) Gait Pattern/deviations: Step-to pattern;Decreased step length - right;Decreased step length - left;Shuffle;Antalgic;Trunk flexed Gait velocity: decr General Gait Details: Cues for  sequence, posture, position from RW.  Pt ltd by c/o discomfort bil hands - pt with RA in hands    Exercises Total Joint Exercises Ankle Circles/Pumps: AROM;15 reps;Supine;Both Quad Sets: AROM;Both;10 reps;Supine Heel Slides: AAROM;20 reps;Right;Supine Hip ABduction/ADduction: AAROM;15 reps;Supine;Right   PT Diagnosis: Difficulty walking  PT Problem List: Decreased strength;Decreased range of motion;Decreased activity tolerance;Decreased mobility;Decreased knowledge of use of DME;Obesity;Pain PT Treatment Interventions: DME instruction;Gait training;Stair training;Functional mobility training;Therapeutic activities;Therapeutic exercise;Patient/family education     PT Goals(Current goals can be found in the care plan section) Acute Rehab PT Goals Patient Stated Goal: Home to dog PT Goal Formulation: With patient Time For Goal Achievement: 03/17/13 Potential to Achieve Goals: Good  Visit Information  Last PT Received On: 03/10/13 Assistance Needed: +1       Prior [place] expects to be discharged to:: Private residence Living Arrangements: Alone Available Help at Discharge: Family Type of Home: House Home Access: Stairs to enter Technical brewer of Steps: 1 Entrance Stairs-Rails: None Home Layout: One level Home Equipment: None Additional Comments: Sister in law will be assisting Prior Function Level of Independence: Independent with assistive device(s) Communication Communication: No difficulties Dominant Hand: Right    Cognition  Cognition Arousal/Alertness: Awake/alert Behavior During Therapy: WFL for tasks assessed/performed Overall Cognitive Status: Within Functional Limits for tasks assessed    Extremity/Trunk Assessment Upper Extremity Assessment Upper Extremity Assessment: Defer to OT evaluation;RUE deficits/detail;LUE deficits/detail Lower Extremity Assessment Lower Extremity Assessment: RLE deficits/detail RLE Deficits  / Details: Hip strength 2+/5 with AAROM at hip to 75 flex and 15 abd   Balance    End of Session PT - End of Session Equipment  Utilized During Treatment: Gait belt Activity Tolerance: Patient tolerated treatment well Patient left: in chair;with call bell/phone within reach Nurse Communication: Mobility status  GP     Josua Ferrebee 03/10/2013, 9:53 AM

## 2013-03-11 LAB — CBC
HCT: 28.3 % — ABNORMAL LOW (ref 36.0–46.0)
Hemoglobin: 9 g/dL — ABNORMAL LOW (ref 12.0–15.0)
MCH: 28 pg (ref 26.0–34.0)
MCHC: 31.8 g/dL (ref 30.0–36.0)
MCV: 88.2 fL (ref 78.0–100.0)
Platelets: 402 10*3/uL — ABNORMAL HIGH (ref 150–400)
RBC: 3.21 MIL/uL — ABNORMAL LOW (ref 3.87–5.11)
RDW: 14.4 % (ref 11.5–15.5)
WBC: 11.3 10*3/uL — ABNORMAL HIGH (ref 4.0–10.5)

## 2013-03-11 LAB — BASIC METABOLIC PANEL
BUN: 22 mg/dL (ref 6–23)
CALCIUM: 8.9 mg/dL (ref 8.4–10.5)
CO2: 26 meq/L (ref 19–32)
CREATININE: 1.03 mg/dL (ref 0.50–1.10)
Chloride: 103 mEq/L (ref 96–112)
GFR calc Af Amer: 66 mL/min — ABNORMAL LOW (ref >90)
GFR, EST NON AFRICAN AMERICAN: 57 mL/min — AB (ref >90)
GLUCOSE: 95 mg/dL (ref 70–99)
Potassium: 4.4 mEq/L (ref 3.7–5.3)
Sodium: 139 mEq/L (ref 137–147)

## 2013-03-11 MED ORDER — DSS 100 MG PO CAPS: 100.0000 mg | ORAL_CAPSULE | Freq: Two times a day (BID) | ORAL | Status: DC

## 2013-03-11 MED ORDER — PNEUMOCOCCAL VAC POLYVALENT 25 MCG/0.5ML IJ INJ
0.5000 mL | INJECTION | Freq: Once | INTRAMUSCULAR | Status: AC
  Administered 2013-03-11: 0.5 mL via INTRAMUSCULAR
  Filled 2013-03-11: qty 0.5

## 2013-03-11 MED ORDER — METHOCARBAMOL 500 MG PO TABS: 500.0000 mg | ORAL_TABLET | Freq: Four times a day (QID) | ORAL | Status: DC | PRN

## 2013-03-11 MED ORDER — FERROUS SULFATE 325 (65 FE) MG PO TABS: 325.0000 mg | ORAL_TABLET | Freq: Three times a day (TID) | ORAL | Status: DC

## 2013-03-11 MED ORDER — HYDROCODONE-ACETAMINOPHEN 7.5-325 MG PO TABS: 1.0000 | ORAL_TABLET | ORAL | Status: DC | PRN

## 2013-03-11 MED ORDER — POLYETHYLENE GLYCOL 3350 17 G PO PACK: 17.0000 g | PACK | Freq: Two times a day (BID) | ORAL | Status: DC

## 2013-03-11 MED ORDER — ASPIRIN 325 MG PO TBEC: 325.0000 mg | DELAYED_RELEASE_TABLET | Freq: Two times a day (BID) | ORAL | Status: AC

## 2013-03-11 NOTE — Progress Notes (Signed)
Occupational Therapy Treatment Patient Details Name: [name] [name] MRN: [id number] DOB: [birth year] Today's Date: 03/11/2013 Time: 7858-8502 OT Time Calculation (min): 30 min  OT Assessment / Plan / Recommendation  History of present illness pt was admitted with R DA THA.  She has a h/o RA   OT comments    Follow Up Recommendations  Home health OT    Barriers to Discharge       Equipment Recommendations  3 in 1 bedside comode (delivered)    Recommendations for Other Services    Frequency Min 2X/week   Progress towards OT Goals Progress towards OT goals: Goals met/education completed, patient discharged from OT  Plan      Precautions / Restrictions Precautions Precautions: Fall Precaution Comments: ltd use of hands 2* RA  Restrictions Weight Bearing Restrictions: No Other Position/Activity Restrictions: WBAT   Pertinent Vitals/Pain RLE sore, especially with weight bearing--repositioned    ADL  Grooming: Wash/dry hands;Supervision/safety Where Assessed - Grooming: Supported standing Lower Body Dressing: Supervision/safety (pants only with shoehorn as Secondary school teacher) Where Assessed - Lower Body Dressing: Supported sit to Lobbyist: Magazine features editor Method: Sit to Loss adjuster, chartered: Raised toilet seat with arms (or 3-in-1 over toilet) Toileting - Clothing Manipulation and Hygiene: Supervision/safety Where Assessed - Toileting Clothing Manipulation and Hygiene: Sit to stand from 3-in-1 or toilet Transfers/Ambulation Related to ADLs: ambulated to bathroom.  Extra time for sit to stand ADL Comments: provided built up foam for utensils when hands are swollen:  re-educated on edema management (but no edema at this time) Pt states MD told her teds could be left off if she can't arrange for help. I showed her how someone can smooth wrinkles if she wears them.     OT Diagnosis:    OT Problem List:   OT Treatment Interventions:     OT Goals(current  goals can now be found in the care plan section)    Visit Information  Last OT Received On: 03/11/13 Assistance Needed: +1 History of Present Illness: pt was admitted with R DA THA.  She has a h/o RA    Subjective Data      Prior Functioning       Cognition  Cognition Arousal/Alertness: Awake/alert Behavior During Therapy: WFL for tasks assessed/performed Overall Cognitive Status: Within Functional Limits for tasks assessed    Mobility       Exercises      Balance    End of Session OT - End of Session Activity Tolerance: Patient tolerated treatment well Patient left: in chair;with call bell/phone within reach  [place] 03/11/2013, 10:58 AM [name], OTR/L [phone] 03/11/2013

## 2013-03-11 NOTE — Progress Notes (Signed)
Physical Therapy Treatment Patient Details Name: [name] [name] MRN: [id number] DOB: 15-Jun-1950 Today's Date: 03/11/2013 Time: 4270-6237 PT Time Calculation (min): 29 min  PT Assessment / Plan / Recommendation  History of Present Illness pt was admitted with R DA THA.  She has a h/o RA esp B hands   PT Comments   POD # 2    pt plans to D/C to home today. Pt given handout HEP and performed all.  Amb in hallway. Assisted to BR.  Instructed on proper car transfers.  Instructed on use of leg lifter.  Instructed on use of ICE.  Instructed on side sleeping positioning with use of pillows.   Follow Up Recommendations  Home health PT     Does the patient have the potential to tolerate intense rehabilitation     Barriers to Discharge        Equipment Recommendations  Rolling walker with 5" wheels    Recommendations for Other Services    Frequency 7X/week   Progress towards PT Goals Progress towards PT goals: Progressing toward goals  Plan      Precautions / Restrictions Precautions Precautions: Fall Precaution Comments: ltd use of hands 2* RA  Restrictions Weight Bearing Restrictions: No Other Position/Activity Restrictions: WBAT    Pertinent Vitals/Pain C/o 2/10    Mobility  Bed Mobility General bed mobility comments: Pt OOB in recliner Transfers Overall transfer level: Needs assistance Equipment used: Rolling walker (2 wheeled) Transfers: Sit to/from Stand Sit to Stand: Supervision General transfer comment: from chair and from toilet with increased time and one VC on safety with turns Ambulation/Gait Ambulation/Gait assistance: Supervision Ambulation Distance (Feet): 75 Feet Assistive device: Rolling walker (2 wheeled) Gait Pattern/deviations: Step-to pattern;Decreased stance time - right Gait velocity: decr General Gait Details: increased time and one VC on safety with backward gait sequencing      PT Goals (current goals can now be found in the care plan  section)    Visit Information  Last PT Received On: 03/11/13 Assistance Needed: +1 History of Present Illness: pt was admitted with R DA THA.  She has a h/o RA esp B hands    Subjective Data      Cognition  Cognition Arousal/Alertness: Awake/alert Behavior During Therapy: WFL for tasks assessed/performed Overall Cognitive Status: Within Functional Limits for tasks assessed    Balance     End of Session PT - End of Session Equipment Utilized During Treatment: Gait belt Activity Tolerance: Patient tolerated treatment well Patient left: in bed;with call bell/phone within reach   Rica Koyanagi  PTA [facility]  Acute  Rehab Pager      [phone]

## 2013-03-11 NOTE — Progress Notes (Signed)
Patient ID: [name] [name], female   DOB: 1950-10-12, 63 y.o.   MRN: [id number] Subjective: 2 Days Post-Op Procedure(s) (LRB): RIGHT TOTAL HIP ARTHROPLASTY ANTERIOR APPROACH (Right)    Patient reports pain as mild to moderate particularly after prolonged sitting yesterday and then returning to bed.  Otherwise no events  Objective:   VITALS:   Filed Vitals:   03/11/13 0525  BP: 114/69  Pulse: 66  Temp: 97.8 F (36.6 C)  Resp: 18    Neurovascular intact Incision: dressing C/D/I  LABS  Recent Labs  03/10/13 0415 03/11/13 0540  HGB 9.4* 9.0*  HCT 29.1* 28.3*  WBC 14.6* 11.3*  PLT 404* 402*     Recent Labs  03/10/13 0415 03/11/13 0540  NA 136* 139  K 4.6 4.4  BUN 25* 22  CREATININE 1.09 1.03  GLUCOSE 132* 95    No results found for this basename: LABPT, INR,  in the last 72 hours   Assessment/Plan: 2 Days Post-Op Procedure(s) (LRB): RIGHT TOTAL HIP ARTHROPLASTY ANTERIOR APPROACH (Right)   Up with therapy Discharge home with home health today after therapy RTC in 2 weeks

## 2013-03-11 NOTE — Care Management Note (Signed)
    Page 1 of 2   03/11/2013     1:59:01 PM   CARE MANAGEMENT NOTE 03/11/2013  Patient:  [name] [name]   Account Number:  [id number]  Date Initiated:  03/11/2013  Documentation initiated by:  Sunday Spillers  Subjective/Objective Assessment:   63 yo female admitted s/p TKA. PTA lived at home alone.     Action/Plan:   Home when stable   Anticipated DC Date:  03/11/2013   Anticipated DC Plan:  [place]  CM consult      [place]   Choice offered to / List presented to:  C-1 Patient   DME arranged  3-N-1  Vassie Moselle      DME agency  [place] arranged  HH-2 PT      [place].   Status of service:  Completed, signed off Medicare Important Message given?   (If response is "NO", the following Medicare IM given date fields will be blank) Date Medicare IM given:   Date Additional Medicare IM given:    Discharge Disposition:  [place]  Per UR Regulation:  Reviewed for med. necessity/level of care/duration of stay  If discussed at [place] of Stay Meetings, dates discussed:    Comments:

## 2013-03-15 NOTE — Discharge Summary (Signed)
Physician Discharge Summary  Patient ID: [name] [name] MRN: [id number] DOB/AGE: 09/30/1950 63 y.o.  Admit date: 03/09/2013 Discharge date: 03/11/2013   Procedures:  Procedure(s) (LRB): RIGHT TOTAL HIP ARTHROPLASTY ANTERIOR APPROACH (Right)  Attending Physician:  Dr. Paralee Cancel   Admission Diagnoses:   Right hip OA / pain  Discharge Diagnoses:  Principal Problem:   S/P right THA, AA Active Problems:   Morbid obesity   Expected blood loss anemia  Past Medical History  Diagnosis Date  . Hypertension   . Arthritis     oa and ra in arms  . GERD (gastroesophageal reflux disease)   . Deviated nasal septum     right side, cannot breathe out of right septum  . UTI (lower urinary tract infection) 03/02/2013    treated with keflex for hip surgery on 03/09/13    HPI: [name] [name], 63 y.o. female, has a history of pain and functional disability in the right hip(s) due to arthritis and patient has failed non-surgical conservative treatments for greater than 12 weeks to include NSAID's and/or analgesics, use of assistive devices and activity modification. Onset of symptoms was gradual starting >10 years ago with gradually worsening course since that time.The patient noted no past surgery on the right hip(s). Patient currently rates pain in the right hip at 7 out of 10 with activity. Patient has worsening of pain with activity and weight bearing, trendelenberg gait, pain that interfers with activities of daily living and pain with passive range of motion. Patient has evidence of periarticular osteophytes and joint space narrowing by imaging studies. This condition presents safety issues increasing the risk of falls. There is no current active infection. Risks, benefits and expectations were discussed with the patient. Risks including but not limited to the risk of anesthesia, blood clots, nerve damage, blood vessel damage, failure of the prosthesis, infection and up to and including death.  Patient understand the risks, benefits and expectations and wishes to proceed with surgery.  PCP: Default, Provider, MD   Discharged Condition: good  Hospital Course:  Patient underwent the above stated procedure on 03/09/2013. Patient tolerated the procedure well and brought to the recovery room in good condition and subsequently to the floor.  POD #1 BP: 106/66 ; Pulse: 67 ; Temp: 97.7 F (36.5 C) ; Resp: 18  Patient reports pain as mild, pain controlled. No events throughout the night. Working a little slowly with PT.  Neurovascular intact, dorsiflexion/plantar flexion intact, incision: dressing C/D/I, no cellulitis present and compartment soft.   LABS  Basename    HGB  9.4  HCT  29.1   POD #2  BP: 114/69 ; Pulse: 66 ; Temp: 97.8 F (36.6 C) ; Resp: 18  Patient reports pain as mild to moderate particularly after prolonged sitting yesterday and then returning to bed. Otherwise no events. Neurovascular intact, dorsiflexion/plantar flexion intact, incision: dressing C/D/I, no cellulitis present and compartment soft.   LABS  Basename    HGB  9.0  HCT  28.3    Discharge Exam: General appearance: alert, cooperative and no distress Extremities: Homans sign is negative, no sign of DVT, no edema, redness or tenderness in the calves or thighs and no ulcers, gangrene or trophic changes  Disposition:     Home-Health Care Svc with follow up in 2 weeks   Follow-up Information   Follow up with Mauri Pole, MD. Schedule an appointment as soon as possible for a visit in 2 weeks.   Specialty:  Orthopedic Surgery  Contact information:   [address] [place] [address] [phone]       Discharge Orders   Future Orders Complete By Expires   Call MD / Call 911  As directed    Comments:     If you experience chest pain or shortness of breath, CALL 911 and be transported to the hospital emergency room.  If you develope a fever above 101 F, pus (white drainage)  or increased drainage or redness at the wound, or calf pain, call your surgeon's office.   Change dressing  As directed    Comments:     Maintain surgical dressing for 10-14 days, or until follow up in the clinic.   Constipation Prevention  As directed    Comments:     Drink plenty of fluids.  Prune juice may be helpful.  You may use a stool softener, such as Colace (over the counter) 100 mg twice a day.  Use MiraLax (over the counter) for constipation as needed.   Diet - low sodium heart healthy  As directed    Discharge instructions  As directed    Comments:     Maintain surgical dressing for 10-14 days, or until follow up in the clinic. Follow up in 2 weeks at [facility]. Call with any questions or concerns.   Increase activity slowly as tolerated  As directed    TED hose  As directed    Comments:     Use stockings (TED hose) for 2 weeks on both leg(s).  You may remove them at night for sleeping.   Weight bearing as tolerated  As directed    Questions:     Laterality:     Extremity:          Medication List    STOP taking these medications       cephALEXin 500 MG capsule  Commonly known as:  KEFLEX     ciprofloxacin 500 MG tablet  Commonly known as:  CIPRO     naproxen 500 MG tablet  Commonly known as:  NAPROSYN     trolamine salicylate 10 % cream  Commonly known as:  ASPERCREME      TAKE these medications       aspirin 325 MG EC tablet  Take 1 tablet (325 mg total) by mouth 2 (two) times daily.     atenolol 50 MG tablet  Commonly known as:  TENORMIN  Take 50 mg by mouth every evening.     diphenhydrAMINE 25 MG tablet  Commonly known as:  BENADRYL  Take 25 mg by mouth every 6 (six) hours as needed for itching or allergies.     DSS 100 MG Caps  Take 100 mg by mouth 2 (two) times daily.     ferrous sulfate 325 (65 FE) MG tablet  Take 1 tablet (325 mg total) by mouth 3 (three) times daily after meals.     Fish Oil 1000 MG Caps  Take 2,000 mg by  mouth 2 (two) times daily.     Garlic 2130 MG Caps  Take 1,000 mg by mouth daily as needed (helps with a cold).     HYDROcodone-acetaminophen 7.5-325 MG per tablet  Commonly known as:  NORCO  Take 1-2 tablets by mouth every 4 (four) hours as needed for moderate pain.     lisinopril-hydrochlorothiazide 20-12.5 MG per tablet  Commonly known as:  PRINZIDE,ZESTORETIC  Take 1 tablet by mouth every morning.     methocarbamol  500 MG tablet  Commonly known as:  ROBAXIN  Take 1 tablet (500 mg total) by mouth every 6 (six) hours as needed for muscle spasms.     multivitamin with minerals Tabs tablet  Take 1 tablet by mouth daily.     OVER THE COUNTER MEDICATION  3 (three) times daily as needed. Lubricating eye drops     polyethylene glycol packet  Commonly known as:  MIRALAX / GLYCOLAX  Take 17 g by mouth 2 (two) times daily.     Turmeric Curcumin 500 MG Caps  Take 1,000 mg by mouth 2 (two) times daily.         Signed: [place]. Markos Theil   PAC  03/15/2013, 12:38 PM

## 2013-10-26 ENCOUNTER — Institutional Professional Consult (permissible substitution): Payer: BC Managed Care – PPO | Admitting: Emergency Medicine

## 2013-11-05 ENCOUNTER — Ambulatory Visit (INDEPENDENT_AMBULATORY_CARE_PROVIDER_SITE_OTHER): Payer: BC Managed Care – PPO | Admitting: Critical Care Medicine

## 2013-11-05 ENCOUNTER — Encounter: Payer: Self-pay | Admitting: Critical Care Medicine

## 2013-11-05 VITALS — BP 122/80 | HR 59 | Temp 98.5°F | Ht 61.0 in | Wt 244.8 lb

## 2013-11-05 DIAGNOSIS — I10 Essential (primary) hypertension: Secondary | ICD-10-CM | POA: Insufficient documentation

## 2013-11-05 DIAGNOSIS — R0609 Other forms of dyspnea: Secondary | ICD-10-CM

## 2013-11-05 DIAGNOSIS — R0989 Other specified symptoms and signs involving the circulatory and respiratory systems: Secondary | ICD-10-CM

## 2013-11-05 DIAGNOSIS — M069 Rheumatoid arthritis, unspecified: Secondary | ICD-10-CM

## 2013-11-05 DIAGNOSIS — R0600 Dyspnea, unspecified: Secondary | ICD-10-CM

## 2013-11-05 DIAGNOSIS — K219 Gastro-esophageal reflux disease without esophagitis: Secondary | ICD-10-CM

## 2013-11-05 NOTE — Progress Notes (Signed)
Subjective:    Patient ID: [name] [name], female    DOB: [birth year], 63 y.o.   MRN: [id number]  HPI Comments:  PI Comments: Hx of dyspnea with Humira related ?   Started Humira q2weeks in 09/01/2013.  Last dose 09/29/13   Symptoms came on slow and sporadic, no wheeze. MSG also causes symptoms.  Diff with ARAVA: dyspnea, MTX also caused issues.  Cipro also caused dyspnea. No regular hives. Hx of asthma EIA years ago. No assoc sinus .  Since off humira some dyspnea depending on food exposures.  No PFTs.  Hx of pos skin test for mold   Shortness of Breath This is a new problem. The current episode started more than 1 month ago. The problem occurs intermittently. The problem has been rapidly improving. Associated symptoms include chest pain, headaches and leg swelling. Pertinent negatives include no abdominal pain, claudication, coryza, ear pain, fever, hemoptysis, leg pain, neck pain, orthopnea, PND, rash, rhinorrhea, sore throat, sputum production, swollen glands, vomiting or wheezing. Associated symptoms comments: Had chest tightness. Risk factors include smoking. She has tried nothing for the symptoms. Her past medical history is significant for allergies and asthma. There is no history of aspirin allergies, bronchiolitis, CAD, chronic lung disease, COPD, DVT, PE, pneumonia or a recent surgery.    Past Medical History  Diagnosis Date  . Hypertension   . Rheumatoid arthritis     [facility] and [facility] [place], [place]  . GERD (gastroesophageal reflux disease)   . Deviated nasal septum     right side, cannot breathe out of right septum  . UTI (lower urinary tract infection) 03/02/2013    treated with keflex for hip surgery on 03/09/13     Family History  Problem Relation Age of Onset  . Heart disease Father   . Heart disease Brother   . Rheum arthritis Maternal Aunt      History   Social History  . Marital Status: Widowed    Spouse Name: N/A    Number of Children: N/A  . Years of Education:  N/A   Occupational History  . social worker     retired   Social History Main Topics  . Smoking status: Former Smoker -- 1.50 packs/day for 20 years    Types: Cigarettes    Quit date: 02/26/1988  . Smokeless tobacco: Never Used  . Alcohol Use: Yes     Comment: very rare  . Drug Use: Yes    Special: Marijuana     Comment: marijuana none since college  . Sexual Activity: Not on file   Other Topics Concern  . Not on file   Social History Narrative  . No narrative on file     Allergies  Allergen Reactions  . Methotrexate Derivatives Shortness Of Breath  . Ciprofloxacin Other (See Comments)    Breathing and swallowing after two doses of medications  . Neosporin [Neomycin-Bacitracin Zn-Polymyx] Rash  . Sulfa Antibiotics Itching and Rash     Outpatient Prescriptions Prior to Visit  Medication Sig Dispense Refill  . atenolol (TENORMIN) 50 MG tablet Take 50 mg by mouth every evening.      . diphenhydrAMINE (BENADRYL) 25 MG tablet Take 25 mg by mouth every 6 (six) hours as needed for itching or allergies.      . Garlic 3235 MG CAPS Take 1,000 mg by mouth daily as needed (helps with a cold).      Marland Kitchen lisinopril-hydrochlorothiazide (PRINZIDE,ZESTORETIC) 20-12.5 MG per tablet Take 1 tablet  by mouth every morning.      . Multiple Vitamin (MULTIVITAMIN WITH MINERALS) TABS tablet Take 1 tablet by mouth daily.      . Omega-3 Fatty Acids (FISH OIL) 1000 MG CAPS Take 2,000 mg by mouth 2 (two) times daily.      Marland Kitchen OVER THE COUNTER MEDICATION 3 (three) times daily as needed. Lubricating eye drops      . Turmeric Curcumin 500 MG CAPS Take 1,000 mg by mouth 2 (two) times daily.      . ferrous sulfate 325 (65 FE) MG tablet Take 1 tablet (325 mg total) by mouth 3 (three) times daily after meals.    3  . HYDROcodone-acetaminophen (NORCO) 7.5-325 MG per tablet Take 1-2 tablets by mouth every 4 (four) hours as needed for moderate pain.  100 tablet  0  . polyethylene glycol (MIRALAX / GLYCOLAX) packet  Take 17 g by mouth 2 (two) times daily.  14 each  0  . docusate sodium 100 MG CAPS Take 100 mg by mouth 2 (two) times daily.  10 capsule  0  . methocarbamol (ROBAXIN) 500 MG tablet Take 1 tablet (500 mg total) by mouth every 6 (six) hours as needed for muscle spasms.  50 tablet  0   No facility-administered medications prior to visit.       Review of Systems  Constitutional: Negative for fever and unexpected weight change.  HENT: Negative for congestion, dental problem, ear pain, nosebleeds, postnasal drip, rhinorrhea, sinus pressure, sneezing, sore throat and trouble swallowing.   Eyes: Negative for redness and itching.  Respiratory: Positive for shortness of breath. Negative for cough, hemoptysis, sputum production, chest tightness and wheezing.   Cardiovascular: Positive for chest pain and leg swelling. Negative for palpitations, orthopnea, claudication and PND.  Gastrointestinal: Negative for nausea, vomiting and abdominal pain.  Genitourinary: Negative for dysuria.  Musculoskeletal: Negative for joint swelling and neck pain.  Skin: Negative for rash.  Neurological: Positive for headaches.  Hematological: Does not bruise/bleed easily.  Psychiatric/Behavioral: Negative for dysphoric mood. The patient is not nervous/anxious.        Objective:   Physical Exam Filed Vitals:   11/05/13 1210  BP: 122/80  Pulse: 59  Temp: 98.5 F (36.9 C)  TempSrc: Oral  Height: 5\' 1"  (1.549 m)  Weight: 244 lb 12.8 oz (111.041 kg)  SpO2: 98%    Gen: Pleasant, well-nourished, in no distress,  normal affect  ENT: No lesions,  mouth clear,  oropharynx clear, no postnasal drip  Neck: No JVD, no TMG, no carotid bruits  Lungs: No use of accessory muscles, no dullness to percussion, clear without rales or rhonchi  Cardiovascular: RRR, heart sounds normal, no murmur or gallops, no peripheral edema  Abdomen: soft and NT, no HSM,  BS normal  Musculoskeletal: No deformities, no cyanosis or  clubbing  Neuro: alert, non focal  Skin: Warm, no lesions or rashes  No results found.   CXR: No active disease  Labs from rheumatology reviewed. Normal CMET and CBC     Assessment & Plan:   Dyspnea Dyspnea no clear primary pulmonary etiology, leaving reaction to humira a distinct possibility Pt appears to by hypersensitive to other agents  CXR is normal Plan Full PFTs Further recommendations to follow Would suggest HOLDING Humira for now   Updated Medication List Outpatient Encounter Prescriptions as of 11/05/2013  Medication Sig  . atenolol (TENORMIN) 50 MG tablet Take 50 mg by mouth every evening.  . diphenhydrAMINE (BENADRYL) 25 MG  tablet Take 25 mg by mouth every 6 (six) hours as needed for itching or allergies.  . ferrous sulfate 325 (65 FE) MG tablet Take 325 mg by mouth as needed.  . Garlic 0093 MG CAPS Take 1,000 mg by mouth daily as needed (helps with a cold).  Marland Kitchen lisinopril-hydrochlorothiazide (PRINZIDE,ZESTORETIC) 20-12.5 MG per tablet Take 1 tablet by mouth every morning.  . Multiple Vitamin (MULTIVITAMIN WITH MINERALS) TABS tablet Take 1 tablet by mouth daily.  . naproxen (NAPROSYN) 500 MG tablet Take 1 tablet by mouth daily.  . Omega-3 Fatty Acids (FISH OIL) 1000 MG CAPS Take 2,000 mg by mouth 2 (two) times daily.  Marland Kitchen OVER THE COUNTER MEDICATION 3 (three) times daily as needed. Lubricating eye drops  . Turmeric Curcumin 500 MG CAPS Take 1,000 mg by mouth 2 (two) times daily.  . [DISCONTINUED] ferrous sulfate 325 (65 FE) MG tablet Take 1 tablet (325 mg total) by mouth 3 (three) times daily after meals.  . [DISCONTINUED] HYDROcodone-acetaminophen (NORCO) 7.5-325 MG per tablet Take 1-2 tablets by mouth every 4 (four) hours as needed for moderate pain.  . [DISCONTINUED] polyethylene glycol (MIRALAX / GLYCOLAX) packet Take 17 g by mouth 2 (two) times daily.  Marland Kitchen docusate sodium 100 MG CAPS Take 100 mg by mouth 2 (two) times daily.  . [DISCONTINUED] methocarbamol  (ROBAXIN) 500 MG tablet Take 1 tablet (500 mg total) by mouth every 6 (six) hours as needed for muscle spasms.

## 2013-11-05 NOTE — Assessment & Plan Note (Signed)
Dyspnea no clear primary pulmonary etiology, leaving reaction to humira a distinct possibility Pt appears to by hypersensitive to other agents  CXR is normal Plan Full PFTs Further recommendations to follow Would suggest HOLDING Humira for now

## 2013-11-05 NOTE — Patient Instructions (Signed)
Lung function testing will be obtained No medication changes for now No further Humira  I will call with results of my review of your labs and lung function testing

## 2013-11-09 ENCOUNTER — Ambulatory Visit (HOSPITAL_COMMUNITY)
Admission: RE | Admit: 2013-11-09 | Discharge: 2013-11-09 | Disposition: A | Discharge status: Home / Self Care | Payer: BC Managed Care – PPO | Source: Ambulatory Visit | Attending: Critical Care Medicine | Admitting: Critical Care Medicine

## 2013-11-09 DIAGNOSIS — R0609 Other forms of dyspnea: Secondary | ICD-10-CM | POA: Insufficient documentation

## 2013-11-09 DIAGNOSIS — Z87891 Personal history of nicotine dependence: Secondary | ICD-10-CM | POA: Diagnosis not present

## 2013-11-09 DIAGNOSIS — R0989 Other specified symptoms and signs involving the circulatory and respiratory systems: Principal | ICD-10-CM | POA: Insufficient documentation

## 2013-11-09 DIAGNOSIS — R0600 Dyspnea, unspecified: Secondary | ICD-10-CM

## 2013-11-09 LAB — PULMONARY FUNCTION TEST
DL/VA % pred: 92 %
DL/VA: 4.08 ml/min/mmHg/L
DLCO unc % pred: 87 %
DLCO unc: 17.6 ml/min/mmHg
FEF 25-75 Post: 2.34 L/sec
FEF 25-75 Pre: 1.54 L/sec
FEF2575-%Change-Post: 51 %
FEF2575-%PRED-POST: 116 %
FEF2575-%Pred-Pre: 76 %
FEV1-%CHANGE-POST: 15 %
FEV1-%PRED-POST: 97 %
FEV1-%PRED-PRE: 84 %
FEV1-POST: 2.11 L
FEV1-PRE: 1.83 L
FEV1FVC-%CHANGE-POST: 2 %
FEV1FVC-%PRED-PRE: 99 %
FEV6-%Change-Post: 14 %
FEV6-%Pred-Post: 99 %
FEV6-%Pred-Pre: 86 %
FEV6-Post: 2.69 L
FEV6-Pre: 2.36 L
FEV6FVC-%PRED-PRE: 104 %
FEV6FVC-%Pred-Post: 104 %
FVC-%CHANGE-POST: 12 %
FVC-%Pred-Post: 94 %
FVC-%Pred-Pre: 84 %
FVC-Post: 2.69 L
FVC-Pre: 2.39 L
POST FEV1/FVC RATIO: 78 %
PRE FEV1/FVC RATIO: 76 %
PRE FEV6/FVC RATIO: 100 %
Post FEV6/FVC ratio: 100 %
RV % pred: 123 %
RV: 2.36 L
TLC % PRED: 106 %
TLC: 4.89 L

## 2013-11-09 MED ORDER — ALBUTEROL SULFATE (2.5 MG/3ML) 0.083% IN NEBU
2.5000 mg | INHALATION_SOLUTION | Freq: Once | RESPIRATORY_TRACT | Status: AC
  Administered 2013-11-09: 2.5 mg via RESPIRATORY_TRACT

## 2013-11-11 ENCOUNTER — Other Ambulatory Visit: Payer: Self-pay | Admitting: Critical Care Medicine

## 2013-11-11 MED ORDER — MOMETASONE FUROATE 220 MCG/INH IN AEPB: 2.0000 | INHALATION_SPRAY | Freq: Every day | RESPIRATORY_TRACT | Status: DC

## 2013-11-11 NOTE — Progress Notes (Signed)
Quick Note:  Called spoke with patient, advised of PFT results / recs as stated by PW. Pt verbalized her understanding. Orders only encounter created for Asmanex prescription - pt unable to come to the office to pick up sample and have instruct so she will ask her pharmacist to show her how to use the Asmanex and contact the office with any additional questions/concerns. Follow up appt scheduled with PW on 10.20.15 in [place] office.  In the meantime, pt was asking PW's thoughts on her Humira and the possible effects this could have on her dyspnea. She still feels this could be a culprit. Dr Joya Gaskins please advise, thank you. ______

## 2013-11-11 NOTE — Progress Notes (Signed)
i believe the humira could be a culprit and advise hold off on this for now

## 2013-11-11 NOTE — Progress Notes (Signed)
Notes Recorded by Rinaldo Ratel, CMA on 11/11/2013 at 4:03 PM Called spoke with patient, advised of PFT results / recs as stated by PW. Pt verbalized her understanding. Orders only encounter created for Asmanex prescription - pt unable to come to the office to pick up sample and have instruct so she will ask her pharmacist to show her how to use the Asmanex and contact the office with any additional questions/concerns. Follow up appt scheduled with PW on 10.20.15 in [place] office.  In the meantime, pt was asking PW's thoughts on her Humira and the possible effects this could have on her dyspnea. She still feels this could be a culprit. Dr Joya Gaskins please advise, thank you. Notes Recorded by Elsie Stain, MD on 11/10/2013 at 2:31 PM Tell pt she does have some small airway obstruction asthma like syndrome as part of her dyspnea cause Start asmanex hfa two puff daily  Needs f/u OV

## 2013-11-12 NOTE — Progress Notes (Signed)
Quick Note:  Called, spoke with pt. Informed her of humira recs per Dr. Joya Gaskins. She verbalized understanding and voiced no further questions or concerns at this time. ______

## 2013-12-14 ENCOUNTER — Ambulatory Visit (INDEPENDENT_AMBULATORY_CARE_PROVIDER_SITE_OTHER): Payer: BC Managed Care – PPO | Admitting: Critical Care Medicine

## 2013-12-14 ENCOUNTER — Encounter: Payer: Self-pay | Admitting: Critical Care Medicine

## 2013-12-14 VITALS — BP 108/60 | HR 63 | Temp 98.0°F | Ht 61.0 in | Wt 240.0 lb

## 2013-12-14 DIAGNOSIS — R0600 Dyspnea, unspecified: Secondary | ICD-10-CM

## 2013-12-14 NOTE — Patient Instructions (Signed)
Stop asmanex No other medication changes Return as needed

## 2013-12-14 NOTE — Assessment & Plan Note (Signed)
Humira hypersensitivity with airway obstruction,  No real changes with inhaled steroids.   Plan D/c asmanex Return as needed Avoid further humira

## 2013-12-14 NOTE — Progress Notes (Signed)
Subjective:    Patient ID: [name] [name], female    DOB: 09/02/1950, 63 y.o.   MRN: [id number]  HPI Comments:  PI Comments: Hx of dyspnea with Humira related ?   Started Humira q2weeks in 09/01/2013.  Last dose 09/29/13   Symptoms came on slow and sporadic, no wheeze. MSG also causes symptoms.  Diff with ARAVA: dyspnea, MTX also caused issues.  Cipro also caused dyspnea. No regular hives. Hx of asthma EIA years ago. No assoc sinus .  Since off humira some dyspnea depending on food exposures.  No PFTs.  Hx of pos skin test for mold    12/14/2013 Chief Complaint  Patient presents with  . Follow-up    SOB restarted after using Asmanex.  Pt stopped Asmanex, SOB improved.  No cough, wheezing, or chest tightness/pain.  Adverse reaction to asmanex.  Now off all medications and is feeling better  pfts show mild obstruction.  CXR was Neg.  Review of Systems  Constitutional: Negative for unexpected weight change.  HENT: Negative for congestion, dental problem, nosebleeds, postnasal drip, sinus pressure, sneezing and trouble swallowing.   Eyes: Negative for redness and itching.  Respiratory: Negative for cough and chest tightness.   Cardiovascular: Negative for palpitations.  Gastrointestinal: Negative for nausea.  Genitourinary: Negative for dysuria.  Musculoskeletal: Negative for joint swelling.  Hematological: Does not bruise/bleed easily.  Psychiatric/Behavioral: Negative for dysphoric mood. The patient is not nervous/anxious.        Objective:   Physical Exam  Filed Vitals:   12/14/13 1159  BP: 108/60  Pulse: 63  Temp: 98 F (36.7 C)  TempSrc: Oral  Height: 5\' 1"  (1.549 m)  Weight: 108.863 kg (240 lb)  SpO2: 96%    Gen: Pleasant, well-nourished, in no distress,  normal affect  ENT: No lesions,  mouth clear,  oropharynx clear, no postnasal drip  Neck: No JVD, no TMG, no carotid bruits  Lungs: No use of accessory muscles, no dullness to percussion, clear without rales or  rhonchi  Cardiovascular: RRR, heart sounds normal, no murmur or gallops, no peripheral edema  Abdomen: soft and NT, no HSM,  BS normal  Musculoskeletal: No deformities, no cyanosis or clubbing  Neuro: alert, non focal  Skin: Warm, no lesions or rashes  No results found.   CXR: No active disease  Labs from rheumatology reviewed. Normal CMET and CBC     Assessment & Plan:   Dyspnea Humira hypersensitivity with airway obstruction,  No real changes with inhaled steroids.   Plan D/c asmanex Return as needed Avoid further humira    Updated Medication List Outpatient Encounter Prescriptions as of 12/14/2013  Medication Sig  . atenolol (TENORMIN) 50 MG tablet Take 50 mg by mouth every evening.  . diphenhydrAMINE (BENADRYL) 25 MG tablet Take 25 mg by mouth every 6 (six) hours as needed for itching or allergies.  Marland Kitchen lisinopril-hydrochlorothiazide (PRINZIDE,ZESTORETIC) 20-12.5 MG per tablet Take 1 tablet by mouth every morning.  . Multiple Vitamin (MULTIVITAMIN WITH MINERALS) TABS tablet Take 1 tablet by mouth daily.  . naproxen (NAPROSYN) 500 MG tablet Take 1 tablet by mouth daily as needed.   . Omega-3 Fatty Acids (FISH OIL) 1000 MG CAPS Take 2,000 mg by mouth 2 (two) times daily.  Marland Kitchen OVER THE COUNTER MEDICATION 3 (three) times daily as needed. Lubricating eye drops  . Turmeric Curcumin 500 MG CAPS Take 1,000 mg by mouth 2 (two) times daily.  . [DISCONTINUED] docusate sodium 100 MG CAPS Take 100 mg by  mouth 2 (two) times daily.  . [DISCONTINUED] ferrous sulfate 325 (65 FE) MG tablet Take 325 mg by mouth as needed.  . [DISCONTINUED] Garlic 0277 MG CAPS Take 1,000 mg by mouth daily as needed (helps with a cold).  . [DISCONTINUED] mometasone (ASMANEX 60 METERED DOSES) 220 MCG/INH inhaler Inhale 2 puffs into the lungs daily.

## 2014-10-31 IMAGING — CR DG HIP 1V PORT*R*
1 series · 1 of 1 positions shown · non-contrast
Comparison: None.

CLINICAL DATA: Postop right hip.

EXAM:
PORTABLE PELVIS 1-2 VIEWS; PORTABLE RIGHT HIP - 1 VIEW

[shoot- thru lateral]
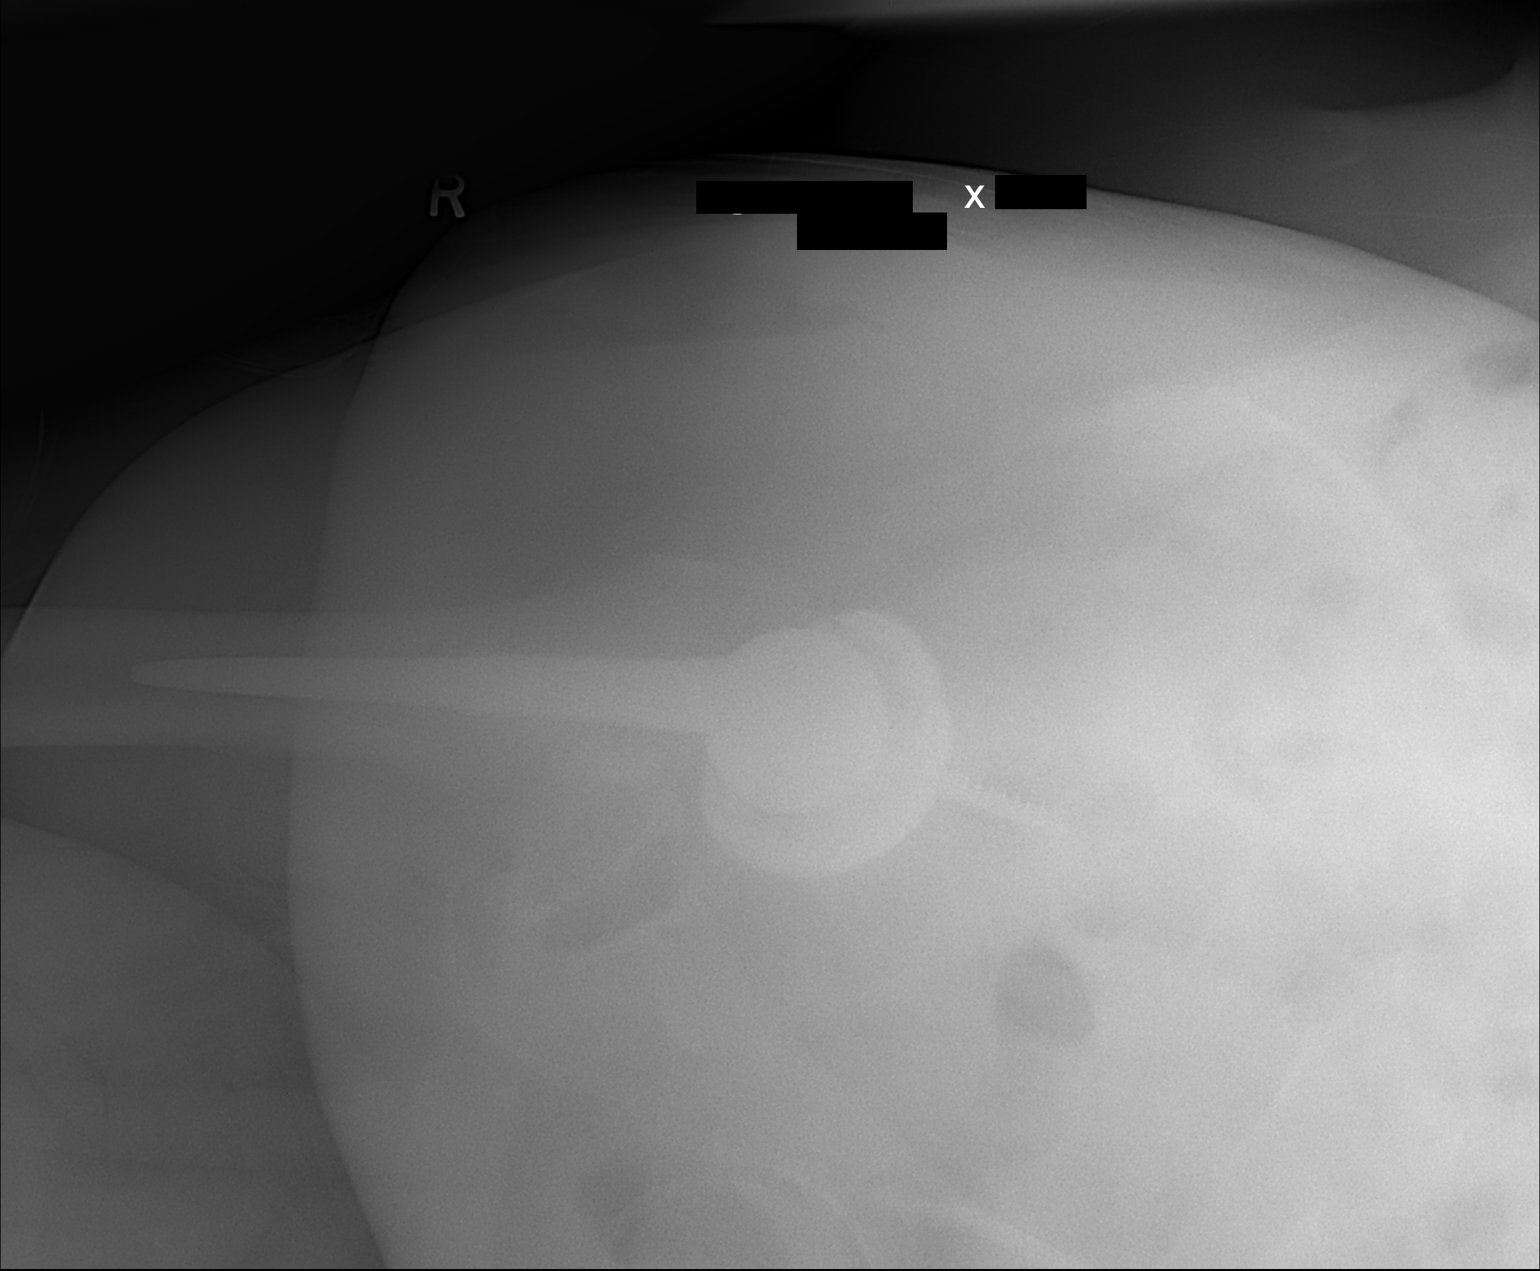

[1 of 1 positions shown; findings below may reference images not displayed]

FINDINGS: Post total right hip replacement which appears in satisfactory
position without complication noted.
IMPRESSION: Post total right hip replacement which appears in satisfactory
position without complication noted.

## 2014-10-31 IMAGING — CR DG PORTABLE PELVIS
1 series · 1 of 1 positions shown · non-contrast
Comparison: None.

CLINICAL DATA: Postop right hip.

EXAM:
PORTABLE PELVIS 1-2 VIEWS; PORTABLE RIGHT HIP - 1 VIEW

[AP]
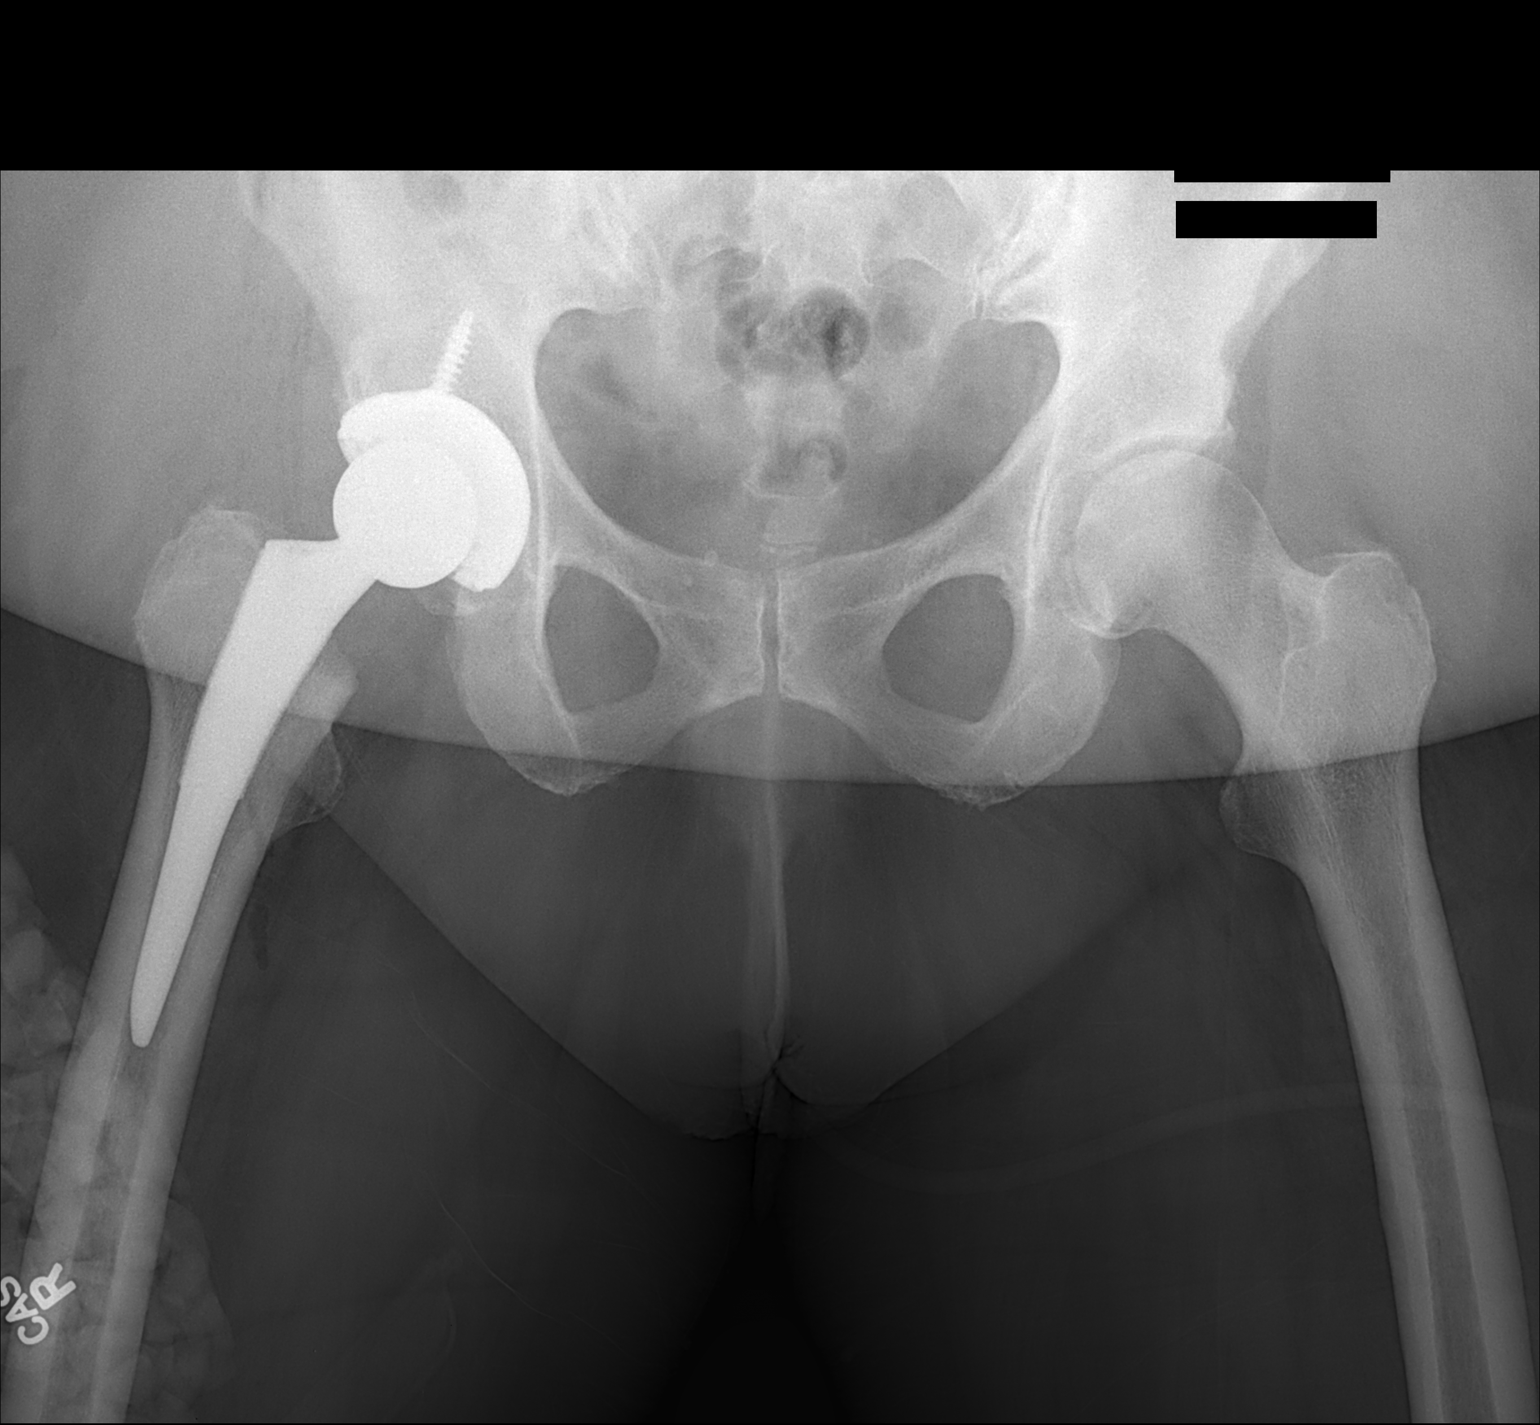

[1 of 1 positions shown; findings below may reference images not displayed]

FINDINGS: Post total right hip replacement which appears in satisfactory
position without complication noted.
IMPRESSION: Post total right hip replacement which appears in satisfactory
position without complication noted.

## 2014-12-27 DIAGNOSIS — M0589 Other rheumatoid arthritis with rheumatoid factor of multiple sites: Secondary | ICD-10-CM | POA: Diagnosis not present

## 2015-01-12 DIAGNOSIS — L03031 Cellulitis of right toe: Secondary | ICD-10-CM | POA: Diagnosis not present

## 2015-01-24 DIAGNOSIS — M0589 Other rheumatoid arthritis with rheumatoid factor of multiple sites: Secondary | ICD-10-CM | POA: Diagnosis not present

## 2015-01-26 DIAGNOSIS — L739 Follicular disorder, unspecified: Secondary | ICD-10-CM | POA: Diagnosis not present

## 2015-01-26 DIAGNOSIS — L820 Inflamed seborrheic keratosis: Secondary | ICD-10-CM | POA: Diagnosis not present

## 2015-01-27 DIAGNOSIS — D510 Vitamin B12 deficiency anemia due to intrinsic factor deficiency: Secondary | ICD-10-CM | POA: Diagnosis not present

## 2015-02-21 DIAGNOSIS — L03032 Cellulitis of left toe: Secondary | ICD-10-CM | POA: Diagnosis not present

## 2015-02-21 DIAGNOSIS — M0589 Other rheumatoid arthritis with rheumatoid factor of multiple sites: Secondary | ICD-10-CM | POA: Diagnosis not present

## 2015-03-13 DIAGNOSIS — Z0000 Encounter for general adult medical examination without abnormal findings: Secondary | ICD-10-CM | POA: Diagnosis not present

## 2015-03-13 DIAGNOSIS — Z23 Encounter for immunization: Secondary | ICD-10-CM | POA: Diagnosis not present

## 2015-03-13 DIAGNOSIS — Z9181 History of falling: Secondary | ICD-10-CM | POA: Diagnosis not present

## 2015-03-13 DIAGNOSIS — R5381 Other malaise: Secondary | ICD-10-CM | POA: Diagnosis not present

## 2015-03-13 DIAGNOSIS — I10 Essential (primary) hypertension: Secondary | ICD-10-CM | POA: Diagnosis not present

## 2015-03-13 DIAGNOSIS — Z6841 Body Mass Index (BMI) 40.0 and over, adult: Secondary | ICD-10-CM | POA: Diagnosis not present

## 2015-03-13 DIAGNOSIS — D510 Vitamin B12 deficiency anemia due to intrinsic factor deficiency: Secondary | ICD-10-CM | POA: Diagnosis not present

## 2015-03-21 DIAGNOSIS — R5383 Other fatigue: Secondary | ICD-10-CM | POA: Diagnosis not present

## 2015-03-21 DIAGNOSIS — M0589 Other rheumatoid arthritis with rheumatoid factor of multiple sites: Secondary | ICD-10-CM | POA: Diagnosis not present

## 2015-03-23 DIAGNOSIS — Z6841 Body Mass Index (BMI) 40.0 and over, adult: Secondary | ICD-10-CM | POA: Diagnosis not present

## 2015-03-23 DIAGNOSIS — R0600 Dyspnea, unspecified: Secondary | ICD-10-CM | POA: Diagnosis not present

## 2015-03-24 DIAGNOSIS — M8589 Other specified disorders of bone density and structure, multiple sites: Secondary | ICD-10-CM | POA: Diagnosis not present

## 2015-03-24 DIAGNOSIS — Z13820 Encounter for screening for osteoporosis: Secondary | ICD-10-CM | POA: Diagnosis not present

## 2015-03-24 DIAGNOSIS — Z1231 Encounter for screening mammogram for malignant neoplasm of breast: Secondary | ICD-10-CM | POA: Diagnosis not present

## 2015-04-10 DIAGNOSIS — N289 Disorder of kidney and ureter, unspecified: Secondary | ICD-10-CM | POA: Diagnosis not present

## 2015-04-10 DIAGNOSIS — E538 Deficiency of other specified B group vitamins: Secondary | ICD-10-CM | POA: Diagnosis not present

## 2015-04-10 DIAGNOSIS — R740 Nonspecific elevation of levels of transaminase and lactic acid dehydrogenase [LDH]: Secondary | ICD-10-CM | POA: Diagnosis not present

## 2015-04-10 DIAGNOSIS — K921 Melena: Secondary | ICD-10-CM | POA: Diagnosis not present

## 2015-04-13 DIAGNOSIS — D510 Vitamin B12 deficiency anemia due to intrinsic factor deficiency: Secondary | ICD-10-CM | POA: Diagnosis not present

## 2015-04-18 DIAGNOSIS — M0589 Other rheumatoid arthritis with rheumatoid factor of multiple sites: Secondary | ICD-10-CM | POA: Diagnosis not present

## 2015-04-21 DIAGNOSIS — D126 Benign neoplasm of colon, unspecified: Secondary | ICD-10-CM | POA: Diagnosis not present

## 2015-04-21 DIAGNOSIS — D128 Benign neoplasm of rectum: Secondary | ICD-10-CM | POA: Diagnosis not present

## 2015-04-21 DIAGNOSIS — I10 Essential (primary) hypertension: Secondary | ICD-10-CM | POA: Diagnosis not present

## 2015-04-21 DIAGNOSIS — D122 Benign neoplasm of ascending colon: Secondary | ICD-10-CM | POA: Diagnosis not present

## 2015-04-21 DIAGNOSIS — D125 Benign neoplasm of sigmoid colon: Secondary | ICD-10-CM | POA: Diagnosis not present

## 2015-04-21 DIAGNOSIS — Z1211 Encounter for screening for malignant neoplasm of colon: Secondary | ICD-10-CM | POA: Diagnosis not present

## 2015-04-21 DIAGNOSIS — Z79899 Other long term (current) drug therapy: Secondary | ICD-10-CM | POA: Diagnosis not present

## 2015-04-21 DIAGNOSIS — R740 Nonspecific elevation of levels of transaminase and lactic acid dehydrogenase [LDH]: Secondary | ICD-10-CM | POA: Diagnosis not present

## 2015-04-21 HISTORY — PX: COLONOSCOPY: SHX174

## 2015-05-04 DIAGNOSIS — H35373 Puckering of macula, bilateral: Secondary | ICD-10-CM | POA: Diagnosis not present

## 2015-05-04 DIAGNOSIS — H43393 Other vitreous opacities, bilateral: Secondary | ICD-10-CM | POA: Diagnosis not present

## 2015-05-04 DIAGNOSIS — H43813 Vitreous degeneration, bilateral: Secondary | ICD-10-CM | POA: Diagnosis not present

## 2015-05-04 DIAGNOSIS — H524 Presbyopia: Secondary | ICD-10-CM | POA: Diagnosis not present

## 2015-05-04 DIAGNOSIS — H52223 Regular astigmatism, bilateral: Secondary | ICD-10-CM | POA: Diagnosis not present

## 2015-05-04 DIAGNOSIS — H43313 Vitreous membranes and strands, bilateral: Secondary | ICD-10-CM | POA: Diagnosis not present

## 2015-05-04 DIAGNOSIS — H25813 Combined forms of age-related cataract, bilateral: Secondary | ICD-10-CM | POA: Diagnosis not present

## 2015-05-04 DIAGNOSIS — H5203 Hypermetropia, bilateral: Secondary | ICD-10-CM | POA: Diagnosis not present

## 2015-05-04 DIAGNOSIS — I10 Essential (primary) hypertension: Secondary | ICD-10-CM | POA: Diagnosis not present

## 2015-05-11 DIAGNOSIS — D510 Vitamin B12 deficiency anemia due to intrinsic factor deficiency: Secondary | ICD-10-CM | POA: Diagnosis not present

## 2015-05-16 DIAGNOSIS — M0589 Other rheumatoid arthritis with rheumatoid factor of multiple sites: Secondary | ICD-10-CM | POA: Diagnosis not present

## 2015-06-12 DIAGNOSIS — D510 Vitamin B12 deficiency anemia due to intrinsic factor deficiency: Secondary | ICD-10-CM | POA: Diagnosis not present

## 2015-06-13 DIAGNOSIS — M0579 Rheumatoid arthritis with rheumatoid factor of multiple sites without organ or systems involvement: Secondary | ICD-10-CM | POA: Diagnosis not present

## 2015-06-16 DIAGNOSIS — R739 Hyperglycemia, unspecified: Secondary | ICD-10-CM | POA: Diagnosis not present

## 2015-06-16 DIAGNOSIS — M069 Rheumatoid arthritis, unspecified: Secondary | ICD-10-CM | POA: Diagnosis not present

## 2015-06-16 DIAGNOSIS — Z79899 Other long term (current) drug therapy: Secondary | ICD-10-CM | POA: Diagnosis not present

## 2015-06-16 DIAGNOSIS — Z792 Long term (current) use of antibiotics: Secondary | ICD-10-CM | POA: Diagnosis not present

## 2015-06-16 DIAGNOSIS — Z6841 Body Mass Index (BMI) 40.0 and over, adult: Secondary | ICD-10-CM | POA: Diagnosis not present

## 2015-06-16 DIAGNOSIS — D510 Vitamin B12 deficiency anemia due to intrinsic factor deficiency: Secondary | ICD-10-CM | POA: Diagnosis not present

## 2015-06-16 DIAGNOSIS — M25579 Pain in unspecified ankle and joints of unspecified foot: Secondary | ICD-10-CM | POA: Diagnosis not present

## 2015-06-16 DIAGNOSIS — I10 Essential (primary) hypertension: Secondary | ICD-10-CM | POA: Diagnosis not present

## 2015-06-27 DIAGNOSIS — M25571 Pain in right ankle and joints of right foot: Secondary | ICD-10-CM | POA: Diagnosis not present

## 2015-06-27 DIAGNOSIS — M19071 Primary osteoarthritis, right ankle and foot: Secondary | ICD-10-CM | POA: Diagnosis not present

## 2015-07-11 DIAGNOSIS — M0579 Rheumatoid arthritis with rheumatoid factor of multiple sites without organ or systems involvement: Secondary | ICD-10-CM | POA: Diagnosis not present

## 2015-07-14 DIAGNOSIS — D510 Vitamin B12 deficiency anemia due to intrinsic factor deficiency: Secondary | ICD-10-CM | POA: Diagnosis not present

## 2015-08-08 DIAGNOSIS — M0579 Rheumatoid arthritis with rheumatoid factor of multiple sites without organ or systems involvement: Secondary | ICD-10-CM | POA: Diagnosis not present

## 2015-08-14 DIAGNOSIS — D510 Vitamin B12 deficiency anemia due to intrinsic factor deficiency: Secondary | ICD-10-CM | POA: Diagnosis not present

## 2015-09-05 DIAGNOSIS — M0579 Rheumatoid arthritis with rheumatoid factor of multiple sites without organ or systems involvement: Secondary | ICD-10-CM | POA: Diagnosis not present

## 2015-09-12 DIAGNOSIS — L728 Other follicular cysts of the skin and subcutaneous tissue: Secondary | ICD-10-CM | POA: Diagnosis not present

## 2015-09-12 DIAGNOSIS — L820 Inflamed seborrheic keratosis: Secondary | ICD-10-CM | POA: Diagnosis not present

## 2015-09-12 DIAGNOSIS — L918 Other hypertrophic disorders of the skin: Secondary | ICD-10-CM | POA: Diagnosis not present

## 2015-09-15 DIAGNOSIS — D510 Vitamin B12 deficiency anemia due to intrinsic factor deficiency: Secondary | ICD-10-CM | POA: Diagnosis not present

## 2015-10-03 DIAGNOSIS — R5383 Other fatigue: Secondary | ICD-10-CM | POA: Diagnosis not present

## 2015-10-03 DIAGNOSIS — M0579 Rheumatoid arthritis with rheumatoid factor of multiple sites without organ or systems involvement: Secondary | ICD-10-CM | POA: Diagnosis not present

## 2015-10-03 DIAGNOSIS — Z79899 Other long term (current) drug therapy: Secondary | ICD-10-CM | POA: Diagnosis not present

## 2015-10-03 DIAGNOSIS — M0589 Other rheumatoid arthritis with rheumatoid factor of multiple sites: Secondary | ICD-10-CM | POA: Diagnosis not present

## 2015-10-16 DIAGNOSIS — E785 Hyperlipidemia, unspecified: Secondary | ICD-10-CM | POA: Diagnosis not present

## 2015-10-16 DIAGNOSIS — M069 Rheumatoid arthritis, unspecified: Secondary | ICD-10-CM | POA: Diagnosis not present

## 2015-10-16 DIAGNOSIS — Z79899 Other long term (current) drug therapy: Secondary | ICD-10-CM | POA: Diagnosis not present

## 2015-10-16 DIAGNOSIS — I10 Essential (primary) hypertension: Secondary | ICD-10-CM | POA: Diagnosis not present

## 2015-10-16 DIAGNOSIS — D510 Vitamin B12 deficiency anemia due to intrinsic factor deficiency: Secondary | ICD-10-CM | POA: Diagnosis not present

## 2015-10-16 DIAGNOSIS — R739 Hyperglycemia, unspecified: Secondary | ICD-10-CM | POA: Diagnosis not present

## 2015-10-31 DIAGNOSIS — M0579 Rheumatoid arthritis with rheumatoid factor of multiple sites without organ or systems involvement: Secondary | ICD-10-CM | POA: Diagnosis not present

## 2015-11-14 DIAGNOSIS — Z23 Encounter for immunization: Secondary | ICD-10-CM | POA: Diagnosis not present

## 2015-11-17 DIAGNOSIS — D510 Vitamin B12 deficiency anemia due to intrinsic factor deficiency: Secondary | ICD-10-CM | POA: Diagnosis not present

## 2015-11-28 DIAGNOSIS — M0579 Rheumatoid arthritis with rheumatoid factor of multiple sites without organ or systems involvement: Secondary | ICD-10-CM | POA: Diagnosis not present

## 2015-12-18 DIAGNOSIS — D510 Vitamin B12 deficiency anemia due to intrinsic factor deficiency: Secondary | ICD-10-CM | POA: Diagnosis not present

## 2015-12-26 DIAGNOSIS — M0579 Rheumatoid arthritis with rheumatoid factor of multiple sites without organ or systems involvement: Secondary | ICD-10-CM | POA: Diagnosis not present

## 2016-01-19 DIAGNOSIS — D510 Vitamin B12 deficiency anemia due to intrinsic factor deficiency: Secondary | ICD-10-CM | POA: Diagnosis not present

## 2016-01-23 DIAGNOSIS — M0579 Rheumatoid arthritis with rheumatoid factor of multiple sites without organ or systems involvement: Secondary | ICD-10-CM | POA: Diagnosis not present

## 2016-01-25 DIAGNOSIS — M170 Bilateral primary osteoarthritis of knee: Secondary | ICD-10-CM | POA: Diagnosis not present

## 2016-01-25 DIAGNOSIS — M25569 Pain in unspecified knee: Secondary | ICD-10-CM | POA: Diagnosis not present

## 2016-01-29 DIAGNOSIS — Z96641 Presence of right artificial hip joint: Secondary | ICD-10-CM | POA: Diagnosis not present

## 2016-01-29 DIAGNOSIS — M7061 Trochanteric bursitis, right hip: Secondary | ICD-10-CM | POA: Diagnosis not present

## 2016-01-29 DIAGNOSIS — Z471 Aftercare following joint replacement surgery: Secondary | ICD-10-CM | POA: Diagnosis not present

## 2016-02-01 DIAGNOSIS — M25551 Pain in right hip: Secondary | ICD-10-CM | POA: Diagnosis not present

## 2016-02-05 DIAGNOSIS — M25551 Pain in right hip: Secondary | ICD-10-CM | POA: Diagnosis not present

## 2016-02-07 DIAGNOSIS — M25551 Pain in right hip: Secondary | ICD-10-CM | POA: Diagnosis not present

## 2016-02-15 DIAGNOSIS — E785 Hyperlipidemia, unspecified: Secondary | ICD-10-CM | POA: Diagnosis not present

## 2016-02-15 DIAGNOSIS — N182 Chronic kidney disease, stage 2 (mild): Secondary | ICD-10-CM | POA: Diagnosis not present

## 2016-02-15 DIAGNOSIS — I10 Essential (primary) hypertension: Secondary | ICD-10-CM | POA: Diagnosis not present

## 2016-02-15 DIAGNOSIS — Z79899 Other long term (current) drug therapy: Secondary | ICD-10-CM | POA: Diagnosis not present

## 2016-02-15 DIAGNOSIS — R739 Hyperglycemia, unspecified: Secondary | ICD-10-CM | POA: Diagnosis not present

## 2016-02-15 DIAGNOSIS — M069 Rheumatoid arthritis, unspecified: Secondary | ICD-10-CM | POA: Diagnosis not present

## 2016-02-15 DIAGNOSIS — Z6841 Body Mass Index (BMI) 40.0 and over, adult: Secondary | ICD-10-CM | POA: Diagnosis not present

## 2016-02-15 DIAGNOSIS — D510 Vitamin B12 deficiency anemia due to intrinsic factor deficiency: Secondary | ICD-10-CM | POA: Diagnosis not present

## 2016-02-20 DIAGNOSIS — D510 Vitamin B12 deficiency anemia due to intrinsic factor deficiency: Secondary | ICD-10-CM | POA: Diagnosis not present

## 2016-02-27 DIAGNOSIS — M0579 Rheumatoid arthritis with rheumatoid factor of multiple sites without organ or systems involvement: Secondary | ICD-10-CM | POA: Diagnosis not present

## 2016-02-28 DIAGNOSIS — M25551 Pain in right hip: Secondary | ICD-10-CM | POA: Diagnosis not present

## 2016-03-01 DIAGNOSIS — N182 Chronic kidney disease, stage 2 (mild): Secondary | ICD-10-CM | POA: Diagnosis not present

## 2016-03-04 DIAGNOSIS — M25551 Pain in right hip: Secondary | ICD-10-CM | POA: Diagnosis not present

## 2016-03-06 DIAGNOSIS — M25551 Pain in right hip: Secondary | ICD-10-CM | POA: Diagnosis not present

## 2016-03-11 DIAGNOSIS — M25551 Pain in right hip: Secondary | ICD-10-CM | POA: Diagnosis not present

## 2016-03-18 DIAGNOSIS — Z136 Encounter for screening for cardiovascular disorders: Secondary | ICD-10-CM | POA: Diagnosis not present

## 2016-03-18 DIAGNOSIS — E669 Obesity, unspecified: Secondary | ICD-10-CM | POA: Diagnosis not present

## 2016-03-18 DIAGNOSIS — Z1389 Encounter for screening for other disorder: Secondary | ICD-10-CM | POA: Diagnosis not present

## 2016-03-18 DIAGNOSIS — Z9181 History of falling: Secondary | ICD-10-CM | POA: Diagnosis not present

## 2016-03-18 DIAGNOSIS — Z1231 Encounter for screening mammogram for malignant neoplasm of breast: Secondary | ICD-10-CM | POA: Diagnosis not present

## 2016-03-18 DIAGNOSIS — Z0000 Encounter for general adult medical examination without abnormal findings: Secondary | ICD-10-CM | POA: Diagnosis not present

## 2016-03-18 DIAGNOSIS — Z1211 Encounter for screening for malignant neoplasm of colon: Secondary | ICD-10-CM | POA: Diagnosis not present

## 2016-03-19 DIAGNOSIS — M25551 Pain in right hip: Secondary | ICD-10-CM | POA: Diagnosis not present

## 2016-03-20 DIAGNOSIS — Z96641 Presence of right artificial hip joint: Secondary | ICD-10-CM | POA: Diagnosis not present

## 2016-03-20 DIAGNOSIS — Z471 Aftercare following joint replacement surgery: Secondary | ICD-10-CM | POA: Diagnosis not present

## 2016-03-22 DIAGNOSIS — D510 Vitamin B12 deficiency anemia due to intrinsic factor deficiency: Secondary | ICD-10-CM | POA: Diagnosis not present

## 2016-03-26 DIAGNOSIS — M0579 Rheumatoid arthritis with rheumatoid factor of multiple sites without organ or systems involvement: Secondary | ICD-10-CM | POA: Diagnosis not present

## 2016-04-05 DIAGNOSIS — Z1231 Encounter for screening mammogram for malignant neoplasm of breast: Secondary | ICD-10-CM | POA: Diagnosis not present

## 2016-04-05 HISTORY — PX: DIAGNOSTIC MAMMOGRAM: HXRAD719

## 2016-04-23 DIAGNOSIS — M0589 Other rheumatoid arthritis with rheumatoid factor of multiple sites: Secondary | ICD-10-CM | POA: Diagnosis not present

## 2016-04-23 DIAGNOSIS — Z79899 Other long term (current) drug therapy: Secondary | ICD-10-CM | POA: Diagnosis not present

## 2016-04-23 DIAGNOSIS — D510 Vitamin B12 deficiency anemia due to intrinsic factor deficiency: Secondary | ICD-10-CM | POA: Diagnosis not present

## 2016-04-23 DIAGNOSIS — M0579 Rheumatoid arthritis with rheumatoid factor of multiple sites without organ or systems involvement: Secondary | ICD-10-CM | POA: Diagnosis not present

## 2016-04-23 DIAGNOSIS — R5383 Other fatigue: Secondary | ICD-10-CM | POA: Diagnosis not present

## 2016-05-07 DIAGNOSIS — N182 Chronic kidney disease, stage 2 (mild): Secondary | ICD-10-CM | POA: Diagnosis not present

## 2016-05-08 DIAGNOSIS — I10 Essential (primary) hypertension: Secondary | ICD-10-CM | POA: Diagnosis not present

## 2016-05-08 DIAGNOSIS — H52223 Regular astigmatism, bilateral: Secondary | ICD-10-CM | POA: Diagnosis not present

## 2016-05-08 DIAGNOSIS — H43393 Other vitreous opacities, bilateral: Secondary | ICD-10-CM | POA: Diagnosis not present

## 2016-05-08 DIAGNOSIS — H25813 Combined forms of age-related cataract, bilateral: Secondary | ICD-10-CM | POA: Diagnosis not present

## 2016-05-08 DIAGNOSIS — H524 Presbyopia: Secondary | ICD-10-CM | POA: Diagnosis not present

## 2016-05-08 DIAGNOSIS — H4303 Vitreous prolapse, bilateral: Secondary | ICD-10-CM | POA: Diagnosis not present

## 2016-05-08 DIAGNOSIS — H43813 Vitreous degeneration, bilateral: Secondary | ICD-10-CM | POA: Diagnosis not present

## 2016-05-08 DIAGNOSIS — H5203 Hypermetropia, bilateral: Secondary | ICD-10-CM | POA: Diagnosis not present

## 2016-05-16 DIAGNOSIS — M2041 Other hammer toe(s) (acquired), right foot: Secondary | ICD-10-CM | POA: Diagnosis not present

## 2016-05-16 DIAGNOSIS — L600 Ingrowing nail: Secondary | ICD-10-CM | POA: Diagnosis not present

## 2016-05-21 DIAGNOSIS — M810 Age-related osteoporosis without current pathological fracture: Secondary | ICD-10-CM | POA: Diagnosis not present

## 2016-05-21 DIAGNOSIS — D510 Vitamin B12 deficiency anemia due to intrinsic factor deficiency: Secondary | ICD-10-CM | POA: Diagnosis not present

## 2016-05-21 DIAGNOSIS — M0579 Rheumatoid arthritis with rheumatoid factor of multiple sites without organ or systems involvement: Secondary | ICD-10-CM | POA: Diagnosis not present

## 2016-06-19 DIAGNOSIS — M0579 Rheumatoid arthritis with rheumatoid factor of multiple sites without organ or systems involvement: Secondary | ICD-10-CM | POA: Diagnosis not present

## 2016-06-20 DIAGNOSIS — M069 Rheumatoid arthritis, unspecified: Secondary | ICD-10-CM | POA: Diagnosis not present

## 2016-06-20 DIAGNOSIS — N182 Chronic kidney disease, stage 2 (mild): Secondary | ICD-10-CM | POA: Diagnosis not present

## 2016-06-20 DIAGNOSIS — Z79899 Other long term (current) drug therapy: Secondary | ICD-10-CM | POA: Diagnosis not present

## 2016-06-20 DIAGNOSIS — E785 Hyperlipidemia, unspecified: Secondary | ICD-10-CM | POA: Diagnosis not present

## 2016-06-20 DIAGNOSIS — R739 Hyperglycemia, unspecified: Secondary | ICD-10-CM | POA: Diagnosis not present

## 2016-06-20 DIAGNOSIS — I10 Essential (primary) hypertension: Secondary | ICD-10-CM | POA: Diagnosis not present

## 2016-06-24 DIAGNOSIS — D510 Vitamin B12 deficiency anemia due to intrinsic factor deficiency: Secondary | ICD-10-CM | POA: Diagnosis not present

## 2016-07-03 DIAGNOSIS — N182 Chronic kidney disease, stage 2 (mild): Secondary | ICD-10-CM | POA: Diagnosis not present

## 2016-07-16 DIAGNOSIS — M0579 Rheumatoid arthritis with rheumatoid factor of multiple sites without organ or systems involvement: Secondary | ICD-10-CM | POA: Diagnosis not present

## 2016-07-19 DIAGNOSIS — J209 Acute bronchitis, unspecified: Secondary | ICD-10-CM | POA: Diagnosis not present

## 2016-07-19 DIAGNOSIS — I10 Essential (primary) hypertension: Secondary | ICD-10-CM | POA: Diagnosis not present

## 2016-07-19 DIAGNOSIS — Z6841 Body Mass Index (BMI) 40.0 and over, adult: Secondary | ICD-10-CM | POA: Diagnosis not present

## 2016-07-25 DIAGNOSIS — D510 Vitamin B12 deficiency anemia due to intrinsic factor deficiency: Secondary | ICD-10-CM | POA: Diagnosis not present

## 2016-08-13 DIAGNOSIS — R5383 Other fatigue: Secondary | ICD-10-CM | POA: Diagnosis not present

## 2016-08-13 DIAGNOSIS — M0589 Other rheumatoid arthritis with rheumatoid factor of multiple sites: Secondary | ICD-10-CM | POA: Diagnosis not present

## 2016-08-13 DIAGNOSIS — M0579 Rheumatoid arthritis with rheumatoid factor of multiple sites without organ or systems involvement: Secondary | ICD-10-CM | POA: Diagnosis not present

## 2016-08-24 DIAGNOSIS — M79671 Pain in right foot: Secondary | ICD-10-CM | POA: Diagnosis not present

## 2016-08-24 DIAGNOSIS — M79672 Pain in left foot: Secondary | ICD-10-CM | POA: Diagnosis not present

## 2016-08-24 DIAGNOSIS — M109 Gout, unspecified: Secondary | ICD-10-CM | POA: Diagnosis not present

## 2016-08-24 DIAGNOSIS — M79605 Pain in left leg: Secondary | ICD-10-CM | POA: Diagnosis not present

## 2016-08-24 DIAGNOSIS — M7989 Other specified soft tissue disorders: Secondary | ICD-10-CM | POA: Diagnosis not present

## 2016-08-26 DIAGNOSIS — M79672 Pain in left foot: Secondary | ICD-10-CM | POA: Diagnosis not present

## 2016-08-26 DIAGNOSIS — R600 Localized edema: Secondary | ICD-10-CM | POA: Diagnosis not present

## 2016-08-26 DIAGNOSIS — Z79899 Other long term (current) drug therapy: Secondary | ICD-10-CM | POA: Diagnosis not present

## 2016-08-26 DIAGNOSIS — D510 Vitamin B12 deficiency anemia due to intrinsic factor deficiency: Secondary | ICD-10-CM | POA: Diagnosis not present

## 2016-08-29 DIAGNOSIS — M19072 Primary osteoarthritis, left ankle and foot: Secondary | ICD-10-CM | POA: Diagnosis not present

## 2016-08-29 DIAGNOSIS — M79672 Pain in left foot: Secondary | ICD-10-CM | POA: Diagnosis not present

## 2016-08-29 DIAGNOSIS — M7672 Peroneal tendinitis, left leg: Secondary | ICD-10-CM | POA: Diagnosis not present

## 2016-09-10 DIAGNOSIS — M0579 Rheumatoid arthritis with rheumatoid factor of multiple sites without organ or systems involvement: Secondary | ICD-10-CM | POA: Diagnosis not present

## 2016-09-12 DIAGNOSIS — M7672 Peroneal tendinitis, left leg: Secondary | ICD-10-CM | POA: Diagnosis not present

## 2016-09-12 DIAGNOSIS — M19072 Primary osteoarthritis, left ankle and foot: Secondary | ICD-10-CM | POA: Diagnosis not present

## 2016-09-12 DIAGNOSIS — M2041 Other hammer toe(s) (acquired), right foot: Secondary | ICD-10-CM | POA: Diagnosis not present

## 2016-09-26 DIAGNOSIS — D510 Vitamin B12 deficiency anemia due to intrinsic factor deficiency: Secondary | ICD-10-CM | POA: Diagnosis not present

## 2016-10-08 DIAGNOSIS — M0579 Rheumatoid arthritis with rheumatoid factor of multiple sites without organ or systems involvement: Secondary | ICD-10-CM | POA: Diagnosis not present

## 2016-10-31 DIAGNOSIS — E785 Hyperlipidemia, unspecified: Secondary | ICD-10-CM | POA: Diagnosis not present

## 2016-10-31 DIAGNOSIS — R739 Hyperglycemia, unspecified: Secondary | ICD-10-CM | POA: Diagnosis not present

## 2016-10-31 DIAGNOSIS — M069 Rheumatoid arthritis, unspecified: Secondary | ICD-10-CM | POA: Diagnosis not present

## 2016-10-31 DIAGNOSIS — D510 Vitamin B12 deficiency anemia due to intrinsic factor deficiency: Secondary | ICD-10-CM | POA: Diagnosis not present

## 2016-11-05 DIAGNOSIS — M0579 Rheumatoid arthritis with rheumatoid factor of multiple sites without organ or systems involvement: Secondary | ICD-10-CM | POA: Diagnosis not present

## 2016-11-28 DIAGNOSIS — R5383 Other fatigue: Secondary | ICD-10-CM | POA: Diagnosis not present

## 2016-11-28 DIAGNOSIS — M0589 Other rheumatoid arthritis with rheumatoid factor of multiple sites: Secondary | ICD-10-CM | POA: Diagnosis not present

## 2016-11-28 DIAGNOSIS — M25579 Pain in unspecified ankle and joints of unspecified foot: Secondary | ICD-10-CM | POA: Diagnosis not present

## 2016-11-28 DIAGNOSIS — Z79899 Other long term (current) drug therapy: Secondary | ICD-10-CM | POA: Diagnosis not present

## 2016-12-02 DIAGNOSIS — D510 Vitamin B12 deficiency anemia due to intrinsic factor deficiency: Secondary | ICD-10-CM | POA: Diagnosis not present

## 2016-12-03 DIAGNOSIS — M0579 Rheumatoid arthritis with rheumatoid factor of multiple sites without organ or systems involvement: Secondary | ICD-10-CM | POA: Diagnosis not present

## 2016-12-20 DIAGNOSIS — Z23 Encounter for immunization: Secondary | ICD-10-CM | POA: Diagnosis not present

## 2016-12-31 DIAGNOSIS — M0579 Rheumatoid arthritis with rheumatoid factor of multiple sites without organ or systems involvement: Secondary | ICD-10-CM | POA: Diagnosis not present

## 2017-01-03 DIAGNOSIS — D519 Vitamin B12 deficiency anemia, unspecified: Secondary | ICD-10-CM | POA: Diagnosis not present

## 2017-01-21 DIAGNOSIS — L814 Other melanin hyperpigmentation: Secondary | ICD-10-CM | POA: Diagnosis not present

## 2017-01-21 DIAGNOSIS — L821 Other seborrheic keratosis: Secondary | ICD-10-CM | POA: Diagnosis not present

## 2017-01-21 DIAGNOSIS — L570 Actinic keratosis: Secondary | ICD-10-CM | POA: Diagnosis not present

## 2017-01-28 DIAGNOSIS — M0579 Rheumatoid arthritis with rheumatoid factor of multiple sites without organ or systems involvement: Secondary | ICD-10-CM | POA: Diagnosis not present

## 2017-02-05 DIAGNOSIS — D510 Vitamin B12 deficiency anemia due to intrinsic factor deficiency: Secondary | ICD-10-CM | POA: Diagnosis not present

## 2017-03-03 DIAGNOSIS — R739 Hyperglycemia, unspecified: Secondary | ICD-10-CM | POA: Diagnosis not present

## 2017-03-03 DIAGNOSIS — E785 Hyperlipidemia, unspecified: Secondary | ICD-10-CM | POA: Diagnosis not present

## 2017-03-03 DIAGNOSIS — R32 Unspecified urinary incontinence: Secondary | ICD-10-CM | POA: Diagnosis not present

## 2017-03-03 DIAGNOSIS — M069 Rheumatoid arthritis, unspecified: Secondary | ICD-10-CM | POA: Diagnosis not present

## 2017-03-03 DIAGNOSIS — R0600 Dyspnea, unspecified: Secondary | ICD-10-CM | POA: Diagnosis not present

## 2017-03-03 DIAGNOSIS — Z6841 Body Mass Index (BMI) 40.0 and over, adult: Secondary | ICD-10-CM | POA: Diagnosis not present

## 2017-03-03 DIAGNOSIS — I10 Essential (primary) hypertension: Secondary | ICD-10-CM | POA: Diagnosis not present

## 2017-03-06 DIAGNOSIS — I10 Essential (primary) hypertension: Secondary | ICD-10-CM | POA: Diagnosis not present

## 2017-03-06 DIAGNOSIS — I517 Cardiomegaly: Secondary | ICD-10-CM | POA: Diagnosis not present

## 2017-03-10 DIAGNOSIS — D510 Vitamin B12 deficiency anemia due to intrinsic factor deficiency: Secondary | ICD-10-CM | POA: Diagnosis not present

## 2017-03-20 DIAGNOSIS — Z0000 Encounter for general adult medical examination without abnormal findings: Secondary | ICD-10-CM | POA: Diagnosis not present

## 2017-03-20 DIAGNOSIS — Z1331 Encounter for screening for depression: Secondary | ICD-10-CM | POA: Diagnosis not present

## 2017-03-20 DIAGNOSIS — E785 Hyperlipidemia, unspecified: Secondary | ICD-10-CM | POA: Diagnosis not present

## 2017-03-20 DIAGNOSIS — I10 Essential (primary) hypertension: Secondary | ICD-10-CM | POA: Diagnosis not present

## 2017-03-20 DIAGNOSIS — Z136 Encounter for screening for cardiovascular disorders: Secondary | ICD-10-CM | POA: Diagnosis not present

## 2017-03-20 DIAGNOSIS — Z6841 Body Mass Index (BMI) 40.0 and over, adult: Secondary | ICD-10-CM | POA: Diagnosis not present

## 2017-03-20 DIAGNOSIS — Z1231 Encounter for screening mammogram for malignant neoplasm of breast: Secondary | ICD-10-CM | POA: Diagnosis not present

## 2017-03-20 DIAGNOSIS — E669 Obesity, unspecified: Secondary | ICD-10-CM | POA: Diagnosis not present

## 2017-03-20 DIAGNOSIS — Z9181 History of falling: Secondary | ICD-10-CM | POA: Diagnosis not present

## 2017-03-27 DIAGNOSIS — S46912A Strain of unspecified muscle, fascia and tendon at shoulder and upper arm level, left arm, initial encounter: Secondary | ICD-10-CM | POA: Diagnosis not present

## 2017-03-27 DIAGNOSIS — I10 Essential (primary) hypertension: Secondary | ICD-10-CM | POA: Diagnosis not present

## 2017-04-01 DIAGNOSIS — J309 Allergic rhinitis, unspecified: Secondary | ICD-10-CM | POA: Diagnosis not present

## 2017-04-01 DIAGNOSIS — J019 Acute sinusitis, unspecified: Secondary | ICD-10-CM | POA: Diagnosis not present

## 2017-04-08 DIAGNOSIS — Z1231 Encounter for screening mammogram for malignant neoplasm of breast: Secondary | ICD-10-CM | POA: Diagnosis not present

## 2017-04-10 DIAGNOSIS — D510 Vitamin B12 deficiency anemia due to intrinsic factor deficiency: Secondary | ICD-10-CM | POA: Diagnosis not present

## 2017-04-17 DIAGNOSIS — N3942 Incontinence without sensory awareness: Secondary | ICD-10-CM | POA: Diagnosis not present

## 2017-04-17 DIAGNOSIS — R351 Nocturia: Secondary | ICD-10-CM | POA: Diagnosis not present

## 2017-04-17 DIAGNOSIS — R350 Frequency of micturition: Secondary | ICD-10-CM | POA: Diagnosis not present

## 2017-04-29 DIAGNOSIS — M0579 Rheumatoid arthritis with rheumatoid factor of multiple sites without organ or systems involvement: Secondary | ICD-10-CM | POA: Diagnosis not present

## 2017-05-09 DIAGNOSIS — H5203 Hypermetropia, bilateral: Secondary | ICD-10-CM | POA: Diagnosis not present

## 2017-05-09 DIAGNOSIS — H43813 Vitreous degeneration, bilateral: Secondary | ICD-10-CM | POA: Diagnosis not present

## 2017-05-09 DIAGNOSIS — I10 Essential (primary) hypertension: Secondary | ICD-10-CM | POA: Diagnosis not present

## 2017-05-09 DIAGNOSIS — H524 Presbyopia: Secondary | ICD-10-CM | POA: Diagnosis not present

## 2017-05-09 DIAGNOSIS — H52223 Regular astigmatism, bilateral: Secondary | ICD-10-CM | POA: Diagnosis not present

## 2017-05-12 DIAGNOSIS — D510 Vitamin B12 deficiency anemia due to intrinsic factor deficiency: Secondary | ICD-10-CM | POA: Diagnosis not present

## 2017-05-12 DIAGNOSIS — N289 Disorder of kidney and ureter, unspecified: Secondary | ICD-10-CM | POA: Diagnosis not present

## 2017-05-13 DIAGNOSIS — R351 Nocturia: Secondary | ICD-10-CM | POA: Diagnosis not present

## 2017-05-13 DIAGNOSIS — R350 Frequency of micturition: Secondary | ICD-10-CM | POA: Diagnosis not present

## 2017-05-13 DIAGNOSIS — N3942 Incontinence without sensory awareness: Secondary | ICD-10-CM | POA: Diagnosis not present

## 2017-05-15 DIAGNOSIS — N289 Disorder of kidney and ureter, unspecified: Secondary | ICD-10-CM | POA: Diagnosis not present

## 2017-05-15 DIAGNOSIS — M549 Dorsalgia, unspecified: Secondary | ICD-10-CM | POA: Diagnosis not present

## 2017-05-21 DIAGNOSIS — M545 Low back pain: Secondary | ICD-10-CM | POA: Diagnosis not present

## 2017-05-27 DIAGNOSIS — M0579 Rheumatoid arthritis with rheumatoid factor of multiple sites without organ or systems involvement: Secondary | ICD-10-CM | POA: Diagnosis not present

## 2017-05-28 DIAGNOSIS — N3942 Incontinence without sensory awareness: Secondary | ICD-10-CM | POA: Diagnosis not present

## 2017-05-28 DIAGNOSIS — R350 Frequency of micturition: Secondary | ICD-10-CM | POA: Diagnosis not present

## 2017-06-02 DIAGNOSIS — M25579 Pain in unspecified ankle and joints of unspecified foot: Secondary | ICD-10-CM | POA: Diagnosis not present

## 2017-06-02 DIAGNOSIS — M0589 Other rheumatoid arthritis with rheumatoid factor of multiple sites: Secondary | ICD-10-CM | POA: Diagnosis not present

## 2017-06-02 DIAGNOSIS — Z79899 Other long term (current) drug therapy: Secondary | ICD-10-CM | POA: Diagnosis not present

## 2017-06-02 DIAGNOSIS — R5383 Other fatigue: Secondary | ICD-10-CM | POA: Diagnosis not present

## 2017-06-16 DIAGNOSIS — D510 Vitamin B12 deficiency anemia due to intrinsic factor deficiency: Secondary | ICD-10-CM | POA: Diagnosis not present

## 2017-06-24 DIAGNOSIS — M0579 Rheumatoid arthritis with rheumatoid factor of multiple sites without organ or systems involvement: Secondary | ICD-10-CM | POA: Diagnosis not present

## 2017-07-01 DIAGNOSIS — I10 Essential (primary) hypertension: Secondary | ICD-10-CM | POA: Diagnosis not present

## 2017-07-01 DIAGNOSIS — E785 Hyperlipidemia, unspecified: Secondary | ICD-10-CM | POA: Diagnosis not present

## 2017-07-01 DIAGNOSIS — R739 Hyperglycemia, unspecified: Secondary | ICD-10-CM | POA: Diagnosis not present

## 2017-07-01 DIAGNOSIS — D510 Vitamin B12 deficiency anemia due to intrinsic factor deficiency: Secondary | ICD-10-CM | POA: Diagnosis not present

## 2017-07-01 DIAGNOSIS — M069 Rheumatoid arthritis, unspecified: Secondary | ICD-10-CM | POA: Diagnosis not present

## 2017-07-17 DIAGNOSIS — D510 Vitamin B12 deficiency anemia due to intrinsic factor deficiency: Secondary | ICD-10-CM | POA: Diagnosis not present

## 2017-07-22 DIAGNOSIS — M0589 Other rheumatoid arthritis with rheumatoid factor of multiple sites: Secondary | ICD-10-CM | POA: Diagnosis not present

## 2017-08-19 DIAGNOSIS — D510 Vitamin B12 deficiency anemia due to intrinsic factor deficiency: Secondary | ICD-10-CM | POA: Diagnosis not present

## 2017-08-20 DIAGNOSIS — M0589 Other rheumatoid arthritis with rheumatoid factor of multiple sites: Secondary | ICD-10-CM | POA: Diagnosis not present

## 2017-09-19 DIAGNOSIS — J309 Allergic rhinitis, unspecified: Secondary | ICD-10-CM | POA: Diagnosis not present

## 2017-09-19 DIAGNOSIS — Z1212 Encounter for screening for malignant neoplasm of rectum: Secondary | ICD-10-CM | POA: Diagnosis not present

## 2017-09-19 DIAGNOSIS — K625 Hemorrhage of anus and rectum: Secondary | ICD-10-CM | POA: Diagnosis not present

## 2017-09-19 DIAGNOSIS — D510 Vitamin B12 deficiency anemia due to intrinsic factor deficiency: Secondary | ICD-10-CM | POA: Diagnosis not present

## 2017-09-23 DIAGNOSIS — M0589 Other rheumatoid arthritis with rheumatoid factor of multiple sites: Secondary | ICD-10-CM | POA: Diagnosis not present

## 2017-10-20 DIAGNOSIS — D510 Vitamin B12 deficiency anemia due to intrinsic factor deficiency: Secondary | ICD-10-CM | POA: Diagnosis not present

## 2017-10-21 DIAGNOSIS — M0589 Other rheumatoid arthritis with rheumatoid factor of multiple sites: Secondary | ICD-10-CM | POA: Diagnosis not present

## 2017-11-04 DIAGNOSIS — M069 Rheumatoid arthritis, unspecified: Secondary | ICD-10-CM | POA: Diagnosis not present

## 2017-11-04 DIAGNOSIS — D510 Vitamin B12 deficiency anemia due to intrinsic factor deficiency: Secondary | ICD-10-CM | POA: Diagnosis not present

## 2017-11-04 DIAGNOSIS — Z1339 Encounter for screening examination for other mental health and behavioral disorders: Secondary | ICD-10-CM | POA: Diagnosis not present

## 2017-11-04 DIAGNOSIS — E785 Hyperlipidemia, unspecified: Secondary | ICD-10-CM | POA: Diagnosis not present

## 2017-11-04 DIAGNOSIS — R739 Hyperglycemia, unspecified: Secondary | ICD-10-CM | POA: Diagnosis not present

## 2017-11-04 DIAGNOSIS — Z23 Encounter for immunization: Secondary | ICD-10-CM | POA: Diagnosis not present

## 2017-11-04 DIAGNOSIS — I10 Essential (primary) hypertension: Secondary | ICD-10-CM | POA: Diagnosis not present

## 2017-11-18 DIAGNOSIS — M0589 Other rheumatoid arthritis with rheumatoid factor of multiple sites: Secondary | ICD-10-CM | POA: Diagnosis not present

## 2017-11-20 ENCOUNTER — Ambulatory Visit (INDEPENDENT_AMBULATORY_CARE_PROVIDER_SITE_OTHER): Payer: Medicare Other | Admitting: Allergy and Immunology

## 2017-11-20 ENCOUNTER — Encounter: Payer: Self-pay | Admitting: Allergy and Immunology

## 2017-11-20 VITALS — BP 108/56 | HR 65 | Temp 97.7°F | Resp 20 | Ht 61.75 in | Wt 281.6 lb

## 2017-11-20 DIAGNOSIS — J3089 Other allergic rhinitis: Secondary | ICD-10-CM

## 2017-11-20 DIAGNOSIS — J4540 Moderate persistent asthma, uncomplicated: Secondary | ICD-10-CM

## 2017-11-20 DIAGNOSIS — R0689 Other abnormalities of breathing: Secondary | ICD-10-CM

## 2017-11-20 DIAGNOSIS — R0600 Dyspnea, unspecified: Secondary | ICD-10-CM

## 2017-11-20 MED ORDER — FLUTICASONE PROPIONATE 50 MCG/ACT NA SUSP: 1.0000 | Freq: Every day | NASAL | 5 refills | Status: DC

## 2017-11-20 MED ORDER — BUDESONIDE 180 MCG/ACT IN AEPB: 2.0000 | INHALATION_SPRAY | Freq: Every day | RESPIRATORY_TRACT | 5 refills | Status: DC

## 2017-11-20 MED ORDER — ALBUTEROL SULFATE HFA 108 (90 BASE) MCG/ACT IN AERS: INHALATION_SPRAY | RESPIRATORY_TRACT | 1 refills | Status: DC

## 2017-11-20 NOTE — Progress Notes (Signed)
Dear [name],  Thank you for referring [name] [name] to the [facility] on 11/20/2017.   Below is a summation of this patient's evaluation and recommendations.  Thank you for your referral. I will keep you informed about this patient's response to treatment.   If you have any questions please do not hesitate to contact me.   Sincerely,  Jiles Prows, MD Allergy / Immunology [place]   ______________________________________________________________________    NEW PATIENT NOTE  Referring Provider: Lowella Dandy, NP Primary Provider: Nicoletta Dress, MD Date of office visit: 11/20/2017    Subjective:   Chief Complaint:  [name] [name] (DOB: 1950/12/28) is a 67 y.o. female who presents to the clinic on 11/20/2017 with a chief complaint of Establish Care; Shortness of Breath; and Allergies .     HPI: [name] presents to this clinic in evaluation of shortness of breath.  Apparently she has been having issues with shortness of breath for many years.  She has developed shortness of breath with the use of methotrexate.  She has developed shortness of breath with the use of Humira.  When she gets too much MSG consumption she gets shortness of breath.  This appears to be a chronic problem with a waxing and waning pattern for which she will take Benadryl about 1 time per week which does help this issue.  Since November 2018 she has been having more shortness of breath.  In July and August she had a particularly hard time with this shortness of breath for which she had to take Benadryl 3 times a day.  This is more of an air hunger and not being able to get a satisfactory breath.  This is particularly bothersome in the evening but she does not have any nocturnal bronchospastic symptoms.  Apparently she does not get short of breath when she walks her dog for about 20 minutes 3 times a day.  She  does not cough and she does not wheeze.  She can get around cold air with no problem.  There is no obvious trigger giving rise to this issue.  Apparently she developed shortness of breath when a pulmonologist many years ago attempted to have her use an inhaled steroid.  She has been on montelukast for the past 6 weeks and she does not think that this is really made any difference regarding her shortness of breath.  She has a history of nasal congestion and left ear fullness for which she uses Flonase about every other day which takes care of this issue.  She does not have any additional atopic symptomatology.  She does not have any reflux.  He does not have any throat problems.  Past Medical History:  Diagnosis Date  . Deviated nasal septum    right side, cannot breathe out of right septum  . GERD (gastroesophageal reflux disease)   . Hypertension   . Rheumatoid arthritis ([place])    oa and ra in arms, shane anderson  . UTI (lower urinary tract infection) 03/02/2013   treated with keflex for hip surgery on 03/09/13    Past Surgical History:  Procedure Laterality Date  . COLONOSCOPY N/A 04/21/2015  . DIAGNOSTIC MAMMOGRAM Bilateral 04/05/2016  . ingrown toenail removed  5-6 yrs ago  . TOTAL HIP ARTHROPLASTY Right 03/09/2013   Procedure: RIGHT TOTAL HIP ARTHROPLASTY ANTERIOR APPROACH;  Surgeon: Mauri Pole, MD;  Location: [facility];  Service: Orthopedics;  Laterality: Right;    Allergies as of 11/20/2017      Reactions   Methotrexate Derivatives Shortness Of Breath   Ciprofloxacin Other (See Comments)   Breathing and swallowing after two doses of medications   Humira [adalimumab]    SOB   Leflunomide    SOB   Neosporin [neomycin-bacitracin Zn-polymyx] Rash   Sulfa Antibiotics Itching, Rash      Medication List      acetaminophen 500 MG tablet Commonly known as:  TYLENOL Take 500 mg by mouth every 6 (six) hours as needed.   ACTEMRA IV Inject 800 mg into the vein.   ASPERCREME  LIDOCAINE EX Apply topically.   atenolol 50 MG tablet Commonly known as:  TENORMIN Take 50 mg by mouth every evening.   B-12 COMPLIANCE INJECTION IJ Inject 1 Dose as directed. 1 Injection 5 times a month   BIOFREEZE EX Apply topically.   cephALEXin 500 MG capsule Commonly known as:  KEFLEX Take 500 mg by mouth 4 (four) times daily.   Cinnamon 500 MG Tabs Take 1,000 mg by mouth.   diclofenac sodium 1 % Gel Commonly known as:  VOLTAREN Apply 2 g topically 4 (four) times daily.   diphenhydrAMINE 25 MG tablet Commonly known as:  BENADRYL Take 25 mg by mouth every 6 (six) hours as needed for itching or allergies.   Fish Oil 1000 MG Caps Take 2,000 mg by mouth 2 (two) times daily.   fluticasone 27.5 MCG/SPRAY nasal spray Commonly known as:  VERAMYST Place 2 sprays into the nose daily.   IRON 100/C 100-250 MG Tabs Generic drug:  Iron-Vitamin C Take 125 mg by mouth.   Lecithin 1200 MG Caps Take 1,000 mg by mouth every morning.   lisinopril-hydrochlorothiazide 20-12.5 MG tablet Commonly known as:  PRINZIDE,ZESTORETIC Take 1 tablet by mouth every morning.   montelukast 10 MG tablet Commonly known as:  SINGULAIR Take 10 mg by mouth at bedtime.   multivitamin with minerals Tabs tablet Take 1 tablet by mouth daily.   OVER THE COUNTER MEDICATION 3 (three) times daily as needed. Lubricating eye drops   Red Yeast Rice 600 MG Caps Take 600 mg by mouth.   Turmeric Curcumin 500 MG Caps Take 1,000 mg by mouth 2 (two) times daily.       Review of systems negative except as noted in HPI / PMHx or noted below:  Review of Systems  Constitutional: Negative.   HENT: Negative.   Eyes: Negative.   Respiratory: Negative.   Cardiovascular: Negative.   Gastrointestinal: Negative.   Genitourinary: Negative.   Musculoskeletal: Negative.   Skin: Negative.   Neurological: Negative.   Endo/Heme/Allergies: Negative.   Psychiatric/Behavioral: Negative.     Family History    Problem Relation Age of Onset  . Heart disease Father   . Heart disease Brother   . Rheum arthritis Maternal Aunt     Social History   Socioeconomic History  . Marital status: Widowed    Spouse name: Not on file  . Number of children: Not on file  . Years of education: Not on file  . Highest education level: Not on file  Occupational History  . 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 Financial resource strain: Not on file  . Food insecurity:    Worry: Not on file    Inability: Not on file  . Transportation needs:    Medical: Not on file  Non-medical: Not on file  Tobacco Use  . Smoking status: Former Smoker    Packs/day: 1.50    Years: 20.00    Pack years: 30.00    Types: Cigarettes    Last attempt to quit: 02/26/1988    Years since quitting: 29.7  . Smokeless tobacco: Never Used  Substance and Sexual Activity  . Alcohol use: Yes    Comment: very rare  . Drug use: Yes    Types: Marijuana    Comment: marijuana none since college  . Sexual activity: Not on file  Lifestyle  . Physical activity:    Days per week: Not on file    Minutes per session: Not on file  . Stress: Not on file  Relationships  . Social connections:    Talks on phone: Not on file    Gets together: Not on file    Attends religious service: Not on file    Active member of club or organization: Not on file    Attends meetings of clubs or organizations: Not on file    Relationship status: Not on file  . Intimate partner violence:    Fear of current or ex partner: Not on file    Emotionally abused: Not on file    Physically abused: Not on file    Forced sexual activity: Not on file  Other Topics Concern  . Not on file  Social History Narrative  . Not on file    Environmental and Social history  Lives in a house with a dry environment, a dog located inside the household, carpet in the bedroom, no plastic on the bed, no plastic on the pillow, no smokers located  inside the household.  Objective:   Vitals:   11/20/17 1003  BP: (!) 108/56  Pulse: 65  Resp: 20  Temp: 97.7 F (36.5 C)  SpO2: 99%   Height: 5' 1.75" (156.8 cm) Weight: 281 lb 9.6 oz (127.7 kg)  Physical Exam  HENT:  Head: Normocephalic. Head is without right periorbital erythema and without left periorbital erythema.  Right Ear: Tympanic membrane, external ear and ear canal normal.  Left Ear: Tympanic membrane, external ear and ear canal normal.  Nose: Nose normal. No mucosal edema or rhinorrhea.  Mouth/Throat: Oropharynx is clear and moist and mucous membranes are normal. No oropharyngeal exudate.  Eyes: Pupils are equal, round, and reactive to light. Conjunctivae and lids are normal.  Neck: Trachea normal. No tracheal deviation present. No thyromegaly present.  Cardiovascular: Normal rate, regular rhythm, S1 normal, S2 normal and normal heart sounds.  No murmur heard. Pulmonary/Chest: Effort normal. No stridor. No respiratory distress. She has no wheezes. She has no rales. She exhibits no tenderness.  Abdominal: Soft. She exhibits no distension and no mass. There is no hepatosplenomegaly. There is no tenderness. There is no rebound and no guarding.  Musculoskeletal: She exhibits no edema or tenderness.  Lymphadenopathy:       Head (right side): No tonsillar adenopathy present.       Head (left side): No tonsillar adenopathy present.    She has no cervical adenopathy.    She has no axillary adenopathy.  Neurological: She is alert.  Skin: No rash noted. She is not diaphoretic. No erythema. No pallor. Nails show no clubbing.    Diagnostics: Allergy skin tests were performed.  She demonstrated hypersensitivity to house dust mite and mold.  Spirometry was performed and demonstrated an FEV1 of 1.43 @ 67 % of predicted. FEV1/FVC = 0.69.  Following administration of nebulized albuterol her FEV1 rose to 1.57 which was an increase in the FEV1 of 10%.  Oxygen saturation was 97% on  room air at rest.  With walking up and down the hallway her oxygen saturation on room air dropped to 92%.  Results of lung volumes obtained 09 November 2013 identified TLC 106%, RV 123%, DL/VA 92%.  Results of a chest x-ray obtained 07 October 2013 identified no significant abnormality.  Results of the chest x-ray obtained 09 April 2014 identified no significant abnormality.  Results of blood tests obtained 11 March 2013 identified WBC 11.3, hemoglobin 9.0, platelet 402  Results of a echocardiogram obtained 06 March 2017 identified mild concentric left ventricular hypertrophy, normal systolic function with a EF greater than 55%, impaired diastolic filling, no valvular stenosis or regurgitation, no pericardial effusion   Assessment and Plan:    1. Not well controlled moderate persistent asthma   2. Dyspnea and respiratory abnormalities   3. Perennial allergic rhinitis     1.  Allergen avoidance measures  2.  Treat and prevent inflammation:   A.  Flonase 1 spray each nostril 1 time per day  B.  Pulmicort 180 - 2 inhalations 1 time per day  3.  If needed:   A.  Pro-air HFA 1-2 puffs every 4-6 hours if needed  B.  OTC antihistamine  4.  Obtain a chest x-ray and blood ( CBC w/D, TSH, T4, CMP)  5.  Further evaluation and treatment?  Yes, if still with shortness of breath  6.  Return to clinic in 4 weeks or earlier if problem  7.  Obtain fall flu vaccine  [name] appears to be atopic and I suspect that there is a component of respiratory tract inflammation as a result of this atopic immune system.  I will have her perform allergen avoidance measures and use anti-inflammatory agents for both her upper and lower airway.  Because her pattern of shortness of breath has changed recently we will obtain another chest x-ray and some screening blood test.  I will see her back in this clinic in 4 weeks to assess her response and consider further evaluation and treatment based upon this  response.  Jiles Prows, MD Allergy / Immunology [facility]

## 2017-11-20 NOTE — Patient Instructions (Addendum)
  1.  Allergen avoidance measures  2.  Treat and prevent inflammation:   A.  Flonase 1 spray each nostril 1 time per day  B.  Pulmicort 180 - 2 inhalations 1 time per day  3.  If needed:   A.  Pro-air HFA 1-2 puffs every 4-6 hours if needed  B.  OTC antihistamine  4.  Obtain a chest x-ray and blood ( CBC w/D, TSH, T4, CMP)  5.  Further evaluation and treatment?  Yes, if still with shortness of breath  6.  Return to clinic in 4 weeks or earlier if problem  7.  Obtain fall flu vaccine

## 2017-11-21 DIAGNOSIS — D510 Vitamin B12 deficiency anemia due to intrinsic factor deficiency: Secondary | ICD-10-CM | POA: Diagnosis not present

## 2017-11-24 ENCOUNTER — Encounter: Payer: Self-pay | Admitting: Allergy and Immunology

## 2017-11-25 DIAGNOSIS — R0602 Shortness of breath: Secondary | ICD-10-CM | POA: Diagnosis not present

## 2017-11-25 DIAGNOSIS — J449 Chronic obstructive pulmonary disease, unspecified: Secondary | ICD-10-CM | POA: Diagnosis not present

## 2017-11-25 DIAGNOSIS — Z79899 Other long term (current) drug therapy: Secondary | ICD-10-CM | POA: Diagnosis not present

## 2017-12-02 ENCOUNTER — Encounter: Payer: Self-pay | Admitting: Allergy and Immunology

## 2017-12-03 DIAGNOSIS — Z79899 Other long term (current) drug therapy: Secondary | ICD-10-CM | POA: Diagnosis not present

## 2017-12-03 DIAGNOSIS — M0589 Other rheumatoid arthritis with rheumatoid factor of multiple sites: Secondary | ICD-10-CM | POA: Diagnosis not present

## 2017-12-03 DIAGNOSIS — R5383 Other fatigue: Secondary | ICD-10-CM | POA: Diagnosis not present

## 2017-12-03 DIAGNOSIS — M25579 Pain in unspecified ankle and joints of unspecified foot: Secondary | ICD-10-CM | POA: Diagnosis not present

## 2017-12-16 DIAGNOSIS — M0589 Other rheumatoid arthritis with rheumatoid factor of multiple sites: Secondary | ICD-10-CM | POA: Diagnosis not present

## 2017-12-18 ENCOUNTER — Ambulatory Visit (INDEPENDENT_AMBULATORY_CARE_PROVIDER_SITE_OTHER): Payer: Medicare Other | Admitting: Allergy and Immunology

## 2017-12-18 ENCOUNTER — Encounter: Payer: Self-pay | Admitting: Allergy and Immunology

## 2017-12-18 VITALS — BP 112/74 | HR 74 | Resp 17

## 2017-12-18 DIAGNOSIS — J3089 Other allergic rhinitis: Secondary | ICD-10-CM

## 2017-12-18 DIAGNOSIS — J4540 Moderate persistent asthma, uncomplicated: Secondary | ICD-10-CM | POA: Diagnosis not present

## 2017-12-18 NOTE — Progress Notes (Signed)
Follow-up Note  Referring Provider: Nicoletta Dress, MD Primary Provider: Nicoletta Dress, MD Date of Office Visit: 12/18/2017  Subjective:   [name] [name] (DOB: [birth year]) is a 68 y.o. female who returns to the [facility] on 12/18/2017 in re-evaluation of the following:  HPI: [name] [name] presents to this clinic in evaluation of asthma and allergic rhinitis.  I last saw her in this clinic during her initial evaluation of 20 November 2017 at which point in time we established a plan including allergen avoidance measures and use of anti-inflammatory agents for both her upper and lower airway.  She is much better.  She really has very little issue with her respiratory tract at this point in time.  She does not have much shortness of breath.  She has very little issues with nasal congestion.  Her ears have not been bothering her.  She does not exercise because of a musculoskeletal issue tied up with rheumatoid arthritis and osteoarthritis.  She has performed house dust avoidance measures on her bed and pillow.  She has been consistently using Flonase and Pulmicort.  She has no need to use a short acting bronchodilator.  She did obtain a flu vaccine.  Allergies as of 12/18/2017      Reactions   Allopurinol Shortness Of Breath   Asmanex (120 Metered Doses) [mometasone Furoate] Shortness Of Breath   Methotrexate Derivatives Shortness Of Breath   Methylprednisolone Shortness Of Breath   Monosodium Glutamate Shortness Of Breath   Prevnar 13 [pneumococcal 13-val Conj Vacc] Shortness Of Breath   Shingrix [zoster Vac Recomb Adjuvanted] Shortness Of Breath   Tetanus Toxoids Shortness Of Breath   Ciprofloxacin Other (See Comments)   Breathing and swallowing after two doses of medications   Humira [adalimumab]    SOB   Leflunomide    SOB   Neosporin [neomycin-bacitracin Zn-polymyx] Rash   Sulfa Antibiotics Itching, Rash      Medication List      acetaminophen 500  MG tablet Commonly known as:  TYLENOL Take 500 mg by mouth every 6 (six) hours as needed.   ACTEMRA IV Inject 800 mg into the vein.   albuterol 108 (90 Base) MCG/ACT inhaler Commonly known as:  PROVENTIL HFA;VENTOLIN HFA 1-2 puffs every 4-6 hours as needed.   ASPERCREME LIDOCAINE EX Apply topically.   atenolol 50 MG tablet Commonly known as:  TENORMIN Take 50 mg by mouth every evening.   B-12 COMPLIANCE INJECTION IJ Inject 1 Dose as directed. 1 Injection 5 times a month   BIOFREEZE EX Apply topically.   budesonide 180 MCG/ACT inhaler Commonly known as:  PULMICORT Inhale 2 puffs into the lungs daily.   cephALEXin 500 MG capsule Commonly known as:  KEFLEX Take 500 mg by mouth 4 (four) times daily.   Cinnamon 500 MG Tabs Take 1,000 mg by mouth.   diclofenac sodium 1 % Gel Commonly known as:  VOLTAREN Apply 2 g topically 4 (four) times daily.   diphenhydrAMINE 25 MG tablet Commonly known as:  BENADRYL Take 25 mg by mouth every 6 (six) hours as needed for itching or allergies.   Fish Oil 1000 MG Caps Take 2,000 mg by mouth 2 (two) times daily.   fluticasone 27.5 MCG/SPRAY nasal spray Commonly known as:  VERAMYST Place 2 sprays into the nose daily.   fluticasone 50 MCG/ACT nasal spray Commonly known as:  FLONASE Place 1 spray into both nostrils daily.   IRON 100/C 100-250 MG Tabs Generic drug:  Iron-Vitamin C Take 125 mg by mouth.   Lecithin 1200 MG Caps Take 1,000 mg by mouth every morning.   lisinopril-hydrochlorothiazide 20-12.5 MG tablet Commonly known as:  PRINZIDE,ZESTORETIC Take 1 tablet by mouth every morning.   montelukast 10 MG tablet Commonly known as:  SINGULAIR Take 10 mg by mouth at bedtime.   multivitamin with minerals Tabs tablet Take 1 tablet by mouth daily.   OVER THE COUNTER MEDICATION 3 (three) times daily as needed. Lubricating eye drops   Red Yeast Rice 600 MG Caps Take 600 mg by mouth.   Turmeric Curcumin 500 MG Caps Take  1,000 mg by mouth 2 (two) times daily.       Past Medical History:  Diagnosis Date  . Deviated nasal septum    right side, cannot breathe out of right septum  . GERD (gastroesophageal reflux disease)   . Hypertension   . Rheumatoid arthritis ([place])    oa and ra in arms, shane anderson  . UTI (lower urinary tract infection) 03/02/2013   treated with keflex for hip surgery on 03/09/13    Past Surgical History:  Procedure Laterality Date  . COLONOSCOPY N/A 04/21/2015  . DIAGNOSTIC MAMMOGRAM Bilateral 04/05/2016  . ingrown toenail removed  5-6 yrs ago  . TOTAL HIP ARTHROPLASTY Right 03/09/2013   Procedure: RIGHT TOTAL HIP ARTHROPLASTY ANTERIOR APPROACH;  Surgeon: Mauri Pole, MD;  Location: [facility];  Service: Orthopedics;  Laterality: Right;    Review of systems negative except as noted in HPI / PMHx or noted below:  Review of Systems  Constitutional: Negative.   HENT: Negative.   Eyes: Negative.   Respiratory: Negative.   Cardiovascular: Negative.   Gastrointestinal: Negative.   Genitourinary: Negative.   Musculoskeletal: Negative.   Skin: Negative.   Neurological: Negative.   Endo/Heme/Allergies: Negative.   Psychiatric/Behavioral: Negative.      Objective:   Vitals:   12/18/17 1106  BP: 112/74  Pulse: 74  Resp: 17          Physical Exam  HENT:  Head: Normocephalic.  Right Ear: Tympanic membrane, external ear and ear canal normal.  Left Ear: Tympanic membrane, external ear and ear canal normal.  Nose: Nose normal. No mucosal edema or rhinorrhea.  Mouth/Throat: Uvula is midline, oropharynx is clear and moist and mucous membranes are normal. No oropharyngeal exudate.  Eyes: Conjunctivae are normal.  Neck: Trachea normal. No tracheal tenderness present. No tracheal deviation present. No thyromegaly present.  Cardiovascular: Normal rate, regular rhythm, S1 normal, S2 normal and normal heart sounds.  No murmur heard. Pulmonary/Chest: Breath sounds normal. No  stridor. No respiratory distress. She has no wheezes. She has no rales.  Musculoskeletal: She exhibits no edema.  Lymphadenopathy:       Head (right side): No tonsillar adenopathy present.       Head (left side): No tonsillar adenopathy present.    She has no cervical adenopathy.  Neurological: She is alert.  Skin: No rash noted. She is not diaphoretic. No erythema. Nails show no clubbing.    Diagnostics:    Spirometry was performed and demonstrated an FEV1 of 1.51 at 70 % of predicted.  The patient had an Asthma Control Test with the following results: ACT Total Score: 24.    Results of a chest x-ray obtained one October 2019 identified no significant abnormality.  There was low lung volumes with mild bibasilar atelectasis.  Results of blood tests obtained 25 November 2017 identified TSH 3.18 IU/mL, free T4 0.90  NG/DL.  Assessment and Plan:   1. Asthma, moderate persistent, well-controlled   2. Perennial allergic rhinitis     1.  Continue to perform Allergen avoidance measures  2.  Continue to Treat and prevent inflammation:   A.  Flonase 1 spray each nostril 1 time per day  B.  Pulmicort 180 - 2 inhalations 1 time per day  3.  If needed:   A.  Pro-air HFA 1-2 puffs every 4-6 hours if needed  B.  OTC antihistamine  4.  Return to clinic in December 2019 or earlier if problem   [name] [name] appears to be doing very well at this point and I would like to continue to have her use this plan for a full 12 weeks and thus I will see her back in this clinic in 8 weeks.  She will continue to use anti-inflammatory agents for her airway on a consistent basis.  She will contact me during the interval should there be a significant problem.  I did have a talk with her today about possibly going back to the [facility] to undergo some type of aerobic activity such as a recumbent bicycle on a consistent basis to help her cardiopulmonary status which will obviously help her breathing issue.  Allena Katz,  MD Allergy / Immunology [facility]

## 2017-12-18 NOTE — Patient Instructions (Addendum)
  1.  Continue to perform Allergen avoidance measures  2.  Continue to Treat and prevent inflammation:   A.  Flonase 1 spray each nostril 1 time per day  B.  Pulmicort 180 - 2 inhalations 1 time per day  3.  If needed:   A.  Pro-air HFA 1-2 puffs every 4-6 hours if needed  B.  OTC antihistamine  4.  Return to clinic in December 2019 or earlier if problem

## 2017-12-22 ENCOUNTER — Encounter: Payer: Self-pay | Admitting: Allergy and Immunology

## 2017-12-22 DIAGNOSIS — D510 Vitamin B12 deficiency anemia due to intrinsic factor deficiency: Secondary | ICD-10-CM | POA: Diagnosis not present

## 2017-12-30 ENCOUNTER — Encounter: Payer: Self-pay | Admitting: *Deleted

## 2018-01-08 ENCOUNTER — Telehealth: Payer: Self-pay | Admitting: *Deleted

## 2018-01-08 ENCOUNTER — Other Ambulatory Visit: Payer: Self-pay | Admitting: *Deleted

## 2018-01-08 MED ORDER — FLUTICASONE FUROATE 200 MCG/ACT IN AEPB: 1.0000 | INHALATION_SPRAY | Freq: Every day | RESPIRATORY_TRACT | 5 refills | Status: DC

## 2018-01-08 NOTE — Telephone Encounter (Signed)
Per patient she was given sample of Pulmicort until she could find out preferred drug on her PDP.  She advised Arnuity if the preferred and wants rx sent to [facility]\

## 2018-01-08 NOTE — Telephone Encounter (Signed)
Arnuity 200 one inhalation one time per day

## 2018-01-08 NOTE — Telephone Encounter (Signed)
RX sent to [facility].  

## 2018-01-13 DIAGNOSIS — M0589 Other rheumatoid arthritis with rheumatoid factor of multiple sites: Secondary | ICD-10-CM | POA: Diagnosis not present

## 2018-01-23 DIAGNOSIS — D510 Vitamin B12 deficiency anemia due to intrinsic factor deficiency: Secondary | ICD-10-CM | POA: Diagnosis not present

## 2018-01-27 DIAGNOSIS — L821 Other seborrheic keratosis: Secondary | ICD-10-CM | POA: Diagnosis not present

## 2018-01-27 DIAGNOSIS — L570 Actinic keratosis: Secondary | ICD-10-CM | POA: Diagnosis not present

## 2018-01-27 DIAGNOSIS — L918 Other hypertrophic disorders of the skin: Secondary | ICD-10-CM | POA: Diagnosis not present

## 2018-02-05 ENCOUNTER — Encounter: Payer: Self-pay | Admitting: Allergy and Immunology

## 2018-02-05 ENCOUNTER — Ambulatory Visit (INDEPENDENT_AMBULATORY_CARE_PROVIDER_SITE_OTHER): Payer: Medicare Other | Admitting: Allergy and Immunology

## 2018-02-05 VITALS — BP 108/54 | HR 76 | Resp 14

## 2018-02-05 DIAGNOSIS — J3089 Other allergic rhinitis: Secondary | ICD-10-CM

## 2018-02-05 DIAGNOSIS — J4540 Moderate persistent asthma, uncomplicated: Secondary | ICD-10-CM

## 2018-02-05 MED ORDER — ALBUTEROL SULFATE HFA 108 (90 BASE) MCG/ACT IN AERS: INHALATION_SPRAY | RESPIRATORY_TRACT | 1 refills | Status: DC

## 2018-02-05 NOTE — Progress Notes (Signed)
Follow-up Note  Referring Provider: Nicoletta Dress, MD Primary Provider: Nicoletta Dress, MD Date of Office Visit: 02/05/2018  Subjective:   [name] [name] (DOB: Jul 01, 1950) is a 67 y.o. female who returns to the [facility] on 02/05/2018 in re-evaluation of the following:  HPI: [name] [name] returns to this clinic in reevaluation of asthma and allergic rhinitis.  I last saw her in this clinic on 18 December 2017.  Once again she has had an excellent interval of time without any significant respiratory tract symptoms involving either her nose or her chest.  She does not need to use a short acting bronchodilator and does not need to use an antihistamine.  She has been consistently using nasal and inhaled steroids on a daily basis.  Allergies as of 02/05/2018      Reactions   Allopurinol Shortness Of Breath   Asmanex (120 Metered Doses) [mometasone Furoate] Shortness Of Breath   Methotrexate Derivatives Shortness Of Breath   Methylprednisolone Shortness Of Breath   Monosodium Glutamate Shortness Of Breath   Prevnar 13 [pneumococcal 13-val Conj Vacc] Shortness Of Breath   Shingrix [zoster Vac Recomb Adjuvanted] Shortness Of Breath   Tetanus Toxoids Shortness Of Breath   Ciprofloxacin Other (See Comments)   Breathing and swallowing after two doses of medications   Humira [adalimumab]    SOB   Leflunomide    SOB   Neosporin [neomycin-bacitracin Zn-polymyx] Rash   Sulfa Antibiotics Itching, Rash      Medication List      acetaminophen 500 MG tablet Commonly known as:  TYLENOL Take 500 mg by mouth every 6 (six) hours as needed.   ACTEMRA IV Inject 800 mg into the vein.   ASPERCREME LIDOCAINE EX Apply topically.   atenolol 50 MG tablet Commonly known as:  TENORMIN Take 50 mg by mouth every evening.   B-12 COMPLIANCE INJECTION IJ Inject 1 Dose as directed. 1 Injection 5 times a month   BIOFREEZE EX Apply topically.   cephALEXin 500 MG  capsule Commonly known as:  KEFLEX Take 500 mg by mouth 4 (four) times daily.   Cinnamon 500 MG Tabs Take 1,000 mg by mouth.   diclofenac sodium 1 % Gel Commonly known as:  VOLTAREN Apply 2 g topically 4 (four) times daily.   diphenhydrAMINE 25 MG tablet Commonly known as:  BENADRYL Take 25 mg by mouth every 6 (six) hours as needed for itching or allergies.   Fish Oil 1000 MG Caps Take 2,000 mg by mouth 2 (two) times daily.   fluticasone 50 MCG/ACT nasal spray Commonly known as:  FLONASE Place 1 spray into both nostrils daily.   Fluticasone Furoate 200 MCG/ACT Aepb Commonly known as:  ARNUITY ELLIPTA Inhale 1 puff into the lungs daily.   IRON 100/C 100-250 MG Tabs Generic drug:  Iron-Vitamin C Take 125 mg by mouth.   Lecithin 1200 MG Caps Take 1,000 mg by mouth every morning.   lisinopril-hydrochlorothiazide 20-12.5 MG tablet Commonly known as:  PRINZIDE,ZESTORETIC Take 1 tablet by mouth every morning.   montelukast 10 MG tablet Commonly known as:  SINGULAIR Take 10 mg by mouth at bedtime.   multivitamin with minerals Tabs tablet Take 1 tablet by mouth daily.   OVER THE COUNTER MEDICATION 3 (three) times daily as needed. Lubricating eye drops   Red Yeast Rice 600 MG Caps Take 600 mg by mouth.   Turmeric Curcumin 500 MG Caps Take 1,000 mg by mouth 2 (two) times daily.  Past Medical History:  Diagnosis Date  . Deviated nasal septum    right side, cannot breathe out of right septum  . GERD (gastroesophageal reflux disease)   . Hypertension   . Rheumatoid arthritis ([place])    [facility] [place] ra [place], [facility]  . UTI (lower urinary tract infection) 03/02/2013   treated with keflex for hip surgery on 03/09/13    Past Surgical History:  Procedure Laterality Date  . COLONOSCOPY N/A 04/21/2015  . DIAGNOSTIC MAMMOGRAM Bilateral 04/05/2016  . ingrown toenail removed  5-6 yrs ago  . TOTAL HIP ARTHROPLASTY Right 03/09/2013   Procedure: RIGHT TOTAL HIP  ARTHROPLASTY ANTERIOR APPROACH;  Surgeon: Mauri Pole, MD;  Location: [facility];  Service: Orthopedics;  Laterality: Right;    Review of systems negative except as noted in HPI / PMHx or noted below:  Review of Systems  Constitutional: Negative.   HENT: Negative.   Eyes: Negative.   Respiratory: Negative.   Cardiovascular: Negative.   Gastrointestinal: Negative.   Genitourinary: Negative.   Musculoskeletal: Negative.   Skin: Negative.   Neurological: Negative.   Endo/Heme/Allergies: Negative.   Psychiatric/Behavioral: Negative.      Objective:   Vitals:   02/05/18 1155  BP: (!) 108/54  Pulse: 76  Resp: 14          Physical Exam Constitutional:      Appearance: She is not diaphoretic.  HENT:     Head: Normocephalic.     Right Ear: Tympanic membrane, ear canal and external ear normal.     Left Ear: Tympanic membrane, ear canal and external ear normal.     Nose: Nose normal. No mucosal edema or rhinorrhea.     Mouth/Throat:     Pharynx: Uvula midline. No oropharyngeal exudate.  Eyes:     Conjunctiva/sclera: Conjunctivae normal.  Neck:     Thyroid: No thyromegaly.     Trachea: Trachea normal. No tracheal tenderness or tracheal deviation.  Cardiovascular:     Rate and Rhythm: Normal rate and regular rhythm.     Heart sounds: Normal heart sounds, S1 normal and S2 normal. No murmur.  Pulmonary:     Effort: No respiratory distress.     Breath sounds: Normal breath sounds. No stridor. No wheezing or rales.  Lymphadenopathy:     Head:     Right side of head: No tonsillar adenopathy.     Left side of head: No tonsillar adenopathy.     Cervical: No cervical adenopathy.  Skin:    Findings: No erythema or rash.     Nails: There is no clubbing.   Neurological:     Mental Status: She is alert.     Diagnostics:    Spirometry was performed and demonstrated an FEV1 of 1.52 at 72 % of predicted.  The patient had an Asthma Control Test with the following results: ACT  Total Score: 24.    Assessment and Plan:   1. Asthma, moderate persistent, well-controlled   2. Perennial allergic rhinitis     1.  Continue to perform Allergen avoidance measures  2.  Continue to Treat and prevent inflammation:   A.  DECREASE Flonase 1 spray each nostril Mon Wed Fri  B.  DECREASE Arnuity 200 1 inhalation Mon Wed Fri  C. Attempt to discontinue Montelukast  3.  If needed:   A.  Pro-air HFA 1-2 puffs every 4-6 hours if needed  B.  OTC antihistamine  4.  Return to clinic in 6 months  or earlier if problem   [name] [name] has really done very well and will see if there is an opportunity to consolidate her medical therapy as noted above.  We will have her decrease Flonase and Arnuity to 3 times a week and she can make an attempt to discontinue her montelukast.  Of course, should she develop significant respiratory tract symptoms in the face of this tapering approach then we will need to increase her dosage of medications.  She appears to have a very good understanding of this plan.  I will see her back in this clinic in 6 months or earlier if there is a problem.  Allena Katz, MD Allergy / Immunology [place]

## 2018-02-05 NOTE — Patient Instructions (Addendum)
  1.  Continue to perform Allergen avoidance measures  2.  Continue to Treat and prevent inflammation:   A.  DECREASE Flonase 1 spray each nostril Mon Wed Fri  B.  DECREASE Arnuity 200 1 inhalation Mon Wed Fri  C. Attempt to discontinue Montelukast  3.  If needed:   A.  Pro-air HFA 1-2 puffs every 4-6 hours if needed  B.  OTC antihistamine  4.  Return to clinic in 6 months or earlier if problem

## 2018-02-09 ENCOUNTER — Encounter: Payer: Self-pay | Admitting: Allergy and Immunology

## 2018-02-10 DIAGNOSIS — M0589 Other rheumatoid arthritis with rheumatoid factor of multiple sites: Secondary | ICD-10-CM | POA: Diagnosis not present

## 2018-02-24 DIAGNOSIS — D510 Vitamin B12 deficiency anemia due to intrinsic factor deficiency: Secondary | ICD-10-CM | POA: Diagnosis not present

## 2018-03-05 DIAGNOSIS — J069 Acute upper respiratory infection, unspecified: Secondary | ICD-10-CM | POA: Diagnosis not present

## 2018-03-05 DIAGNOSIS — Z6841 Body Mass Index (BMI) 40.0 and over, adult: Secondary | ICD-10-CM | POA: Diagnosis not present

## 2018-03-05 DIAGNOSIS — I10 Essential (primary) hypertension: Secondary | ICD-10-CM | POA: Diagnosis not present

## 2018-03-10 DIAGNOSIS — M069 Rheumatoid arthritis, unspecified: Secondary | ICD-10-CM | POA: Diagnosis not present

## 2018-03-10 DIAGNOSIS — E785 Hyperlipidemia, unspecified: Secondary | ICD-10-CM | POA: Diagnosis not present

## 2018-03-10 DIAGNOSIS — I10 Essential (primary) hypertension: Secondary | ICD-10-CM | POA: Diagnosis not present

## 2018-03-10 DIAGNOSIS — M0589 Other rheumatoid arthritis with rheumatoid factor of multiple sites: Secondary | ICD-10-CM | POA: Diagnosis not present

## 2018-03-10 DIAGNOSIS — D510 Vitamin B12 deficiency anemia due to intrinsic factor deficiency: Secondary | ICD-10-CM | POA: Diagnosis not present

## 2018-03-10 DIAGNOSIS — R739 Hyperglycemia, unspecified: Secondary | ICD-10-CM | POA: Diagnosis not present

## 2018-03-27 DIAGNOSIS — D510 Vitamin B12 deficiency anemia due to intrinsic factor deficiency: Secondary | ICD-10-CM | POA: Diagnosis not present

## 2018-04-07 DIAGNOSIS — M0589 Other rheumatoid arthritis with rheumatoid factor of multiple sites: Secondary | ICD-10-CM | POA: Diagnosis not present

## 2018-04-09 DIAGNOSIS — M19071 Primary osteoarthritis, right ankle and foot: Secondary | ICD-10-CM | POA: Diagnosis not present

## 2018-04-09 DIAGNOSIS — M7671 Peroneal tendinitis, right leg: Secondary | ICD-10-CM | POA: Diagnosis not present

## 2018-04-14 DIAGNOSIS — Z1231 Encounter for screening mammogram for malignant neoplasm of breast: Secondary | ICD-10-CM | POA: Diagnosis not present

## 2018-04-27 DIAGNOSIS — D510 Vitamin B12 deficiency anemia due to intrinsic factor deficiency: Secondary | ICD-10-CM | POA: Diagnosis not present

## 2018-04-27 DIAGNOSIS — N6489 Other specified disorders of breast: Secondary | ICD-10-CM | POA: Diagnosis not present

## 2018-04-27 DIAGNOSIS — R928 Other abnormal and inconclusive findings on diagnostic imaging of breast: Secondary | ICD-10-CM | POA: Diagnosis not present

## 2018-05-06 DIAGNOSIS — Z0000 Encounter for general adult medical examination without abnormal findings: Secondary | ICD-10-CM | POA: Diagnosis not present

## 2018-05-06 DIAGNOSIS — E785 Hyperlipidemia, unspecified: Secondary | ICD-10-CM | POA: Diagnosis not present

## 2018-05-06 DIAGNOSIS — Z9181 History of falling: Secondary | ICD-10-CM | POA: Diagnosis not present

## 2018-05-06 DIAGNOSIS — Z6841 Body Mass Index (BMI) 40.0 and over, adult: Secondary | ICD-10-CM | POA: Diagnosis not present

## 2018-05-06 DIAGNOSIS — E669 Obesity, unspecified: Secondary | ICD-10-CM | POA: Diagnosis not present

## 2018-05-06 DIAGNOSIS — Z136 Encounter for screening for cardiovascular disorders: Secondary | ICD-10-CM | POA: Diagnosis not present

## 2018-05-06 DIAGNOSIS — Z1331 Encounter for screening for depression: Secondary | ICD-10-CM | POA: Diagnosis not present

## 2018-05-11 DIAGNOSIS — M19071 Primary osteoarthritis, right ankle and foot: Secondary | ICD-10-CM | POA: Diagnosis not present

## 2018-05-11 DIAGNOSIS — M7671 Peroneal tendinitis, right leg: Secondary | ICD-10-CM | POA: Diagnosis not present

## 2018-05-12 DIAGNOSIS — M0589 Other rheumatoid arthritis with rheumatoid factor of multiple sites: Secondary | ICD-10-CM | POA: Diagnosis not present

## 2018-05-29 DIAGNOSIS — D510 Vitamin B12 deficiency anemia due to intrinsic factor deficiency: Secondary | ICD-10-CM | POA: Diagnosis not present

## 2018-06-04 DIAGNOSIS — M0589 Other rheumatoid arthritis with rheumatoid factor of multiple sites: Secondary | ICD-10-CM | POA: Diagnosis not present

## 2018-06-04 DIAGNOSIS — R5383 Other fatigue: Secondary | ICD-10-CM | POA: Diagnosis not present

## 2018-06-04 DIAGNOSIS — Z79899 Other long term (current) drug therapy: Secondary | ICD-10-CM | POA: Diagnosis not present

## 2018-06-09 DIAGNOSIS — M0589 Other rheumatoid arthritis with rheumatoid factor of multiple sites: Secondary | ICD-10-CM | POA: Diagnosis not present

## 2018-06-30 DIAGNOSIS — S90222A Contusion of left lesser toe(s) with damage to nail, initial encounter: Secondary | ICD-10-CM | POA: Diagnosis not present

## 2018-06-30 DIAGNOSIS — D510 Vitamin B12 deficiency anemia due to intrinsic factor deficiency: Secondary | ICD-10-CM | POA: Diagnosis not present

## 2018-07-01 DIAGNOSIS — H5203 Hypermetropia, bilateral: Secondary | ICD-10-CM | POA: Diagnosis not present

## 2018-07-01 DIAGNOSIS — H43393 Other vitreous opacities, bilateral: Secondary | ICD-10-CM | POA: Diagnosis not present

## 2018-07-01 DIAGNOSIS — H52223 Regular astigmatism, bilateral: Secondary | ICD-10-CM | POA: Diagnosis not present

## 2018-07-01 DIAGNOSIS — I10 Essential (primary) hypertension: Secondary | ICD-10-CM | POA: Diagnosis not present

## 2018-07-01 DIAGNOSIS — H524 Presbyopia: Secondary | ICD-10-CM | POA: Diagnosis not present

## 2018-07-01 DIAGNOSIS — H43813 Vitreous degeneration, bilateral: Secondary | ICD-10-CM | POA: Diagnosis not present

## 2018-07-07 DIAGNOSIS — M0589 Other rheumatoid arthritis with rheumatoid factor of multiple sites: Secondary | ICD-10-CM | POA: Diagnosis not present

## 2018-07-10 DIAGNOSIS — I10 Essential (primary) hypertension: Secondary | ICD-10-CM | POA: Diagnosis not present

## 2018-07-10 DIAGNOSIS — E785 Hyperlipidemia, unspecified: Secondary | ICD-10-CM | POA: Diagnosis not present

## 2018-07-10 DIAGNOSIS — Z79899 Other long term (current) drug therapy: Secondary | ICD-10-CM | POA: Diagnosis not present

## 2018-07-10 DIAGNOSIS — R739 Hyperglycemia, unspecified: Secondary | ICD-10-CM | POA: Diagnosis not present

## 2018-07-30 DIAGNOSIS — R928 Other abnormal and inconclusive findings on diagnostic imaging of breast: Secondary | ICD-10-CM | POA: Diagnosis not present

## 2018-07-30 DIAGNOSIS — M799 Soft tissue disorder, unspecified: Secondary | ICD-10-CM | POA: Diagnosis not present

## 2018-07-30 DIAGNOSIS — R590 Localized enlarged lymph nodes: Secondary | ICD-10-CM | POA: Diagnosis not present

## 2018-07-31 DIAGNOSIS — D510 Vitamin B12 deficiency anemia due to intrinsic factor deficiency: Secondary | ICD-10-CM | POA: Diagnosis not present

## 2018-08-04 DIAGNOSIS — M0589 Other rheumatoid arthritis with rheumatoid factor of multiple sites: Secondary | ICD-10-CM | POA: Diagnosis not present

## 2018-08-10 ENCOUNTER — Ambulatory Visit (INDEPENDENT_AMBULATORY_CARE_PROVIDER_SITE_OTHER): Payer: Medicare Other | Admitting: Allergy and Immunology

## 2018-08-10 ENCOUNTER — Encounter: Payer: Self-pay | Admitting: Allergy and Immunology

## 2018-08-10 ENCOUNTER — Other Ambulatory Visit: Payer: Self-pay

## 2018-08-10 VITALS — BP 104/64 | HR 60 | Resp 12

## 2018-08-10 DIAGNOSIS — J4540 Moderate persistent asthma, uncomplicated: Secondary | ICD-10-CM | POA: Diagnosis not present

## 2018-08-10 DIAGNOSIS — D899 Disorder involving the immune mechanism, unspecified: Secondary | ICD-10-CM | POA: Diagnosis not present

## 2018-08-10 DIAGNOSIS — D849 Immunodeficiency, unspecified: Secondary | ICD-10-CM

## 2018-08-10 DIAGNOSIS — J3089 Other allergic rhinitis: Secondary | ICD-10-CM

## 2018-08-10 NOTE — Progress Notes (Signed)
[facility] [place] [facility] [place]   Follow-up Note  Referring Provider: Nicoletta Dress, MD Primary Provider: Lowella Dandy, NP Date of Office Visit: 08/10/2018  Subjective:   Meghan Warshawsky (DOB: 1950-12-16) is a 68 y.o. female who returns to the [facility] on 08/10/2018 in re-evaluation of the following:  HPI: [name] [name] returns to this clinic in reevaluation of asthma and allergic rhinitis.  Her last visit to this clinic was 05 February 2018.  Once again she has had a good interval of time regarding her nasal and chest issue.  Rarely does she use any short acting bronchodilator.  She can exert herself to the extent that her musculoskeletal issues allows her to do so.  She has been using her Arnuity 3 times a week and has been using her Flonase even less than that at this point in time.  She has not required a systemic steroid or an antibiotic for any type of airway issue.  She continues with immunosuppression for rheumatoid arthritis utilizing Actemra.  Allergies as of 08/10/2018      Reactions   Allopurinol Shortness Of Breath   Asmanex (120 Metered Doses) [mometasone Furoate] Shortness Of Breath   Methotrexate Derivatives Shortness Of Breath   Methylprednisolone Shortness Of Breath   Monosodium Glutamate Shortness Of Breath   Prevnar 13 [pneumococcal 13-val Conj Vacc] Shortness Of Breath   Shingrix [zoster Vac Recomb Adjuvanted] Shortness Of Breath   Tetanus Toxoids Shortness Of Breath   Ciprofloxacin Other (See Comments)   Breathing and swallowing after two doses of medications   Humira [adalimumab]    SOB   Leflunomide    SOB   Neosporin [neomycin-bacitracin Zn-polymyx] Rash   Sulfa Antibiotics Itching, Rash      Medication List      acetaminophen 500 MG tablet Commonly known as: TYLENOL Take 500 mg by mouth every 6 (six) hours as needed.   ACTEMRA IV Inject 800 mg into the vein.   albuterol 108 (90 Base) MCG/ACT inhaler  Commonly known as: VENTOLIN HFA Inhale two puffs every four to six hours as needed for cough or wheeze.   ASPERCREME LIDOCAINE EX Apply topically.   atenolol 50 MG tablet Commonly known as: TENORMIN Take 50 mg by mouth every evening.   B-12 COMPLIANCE INJECTION IJ Inject 1 Dose as directed. 1 Injection 5 times a month   BIOFREEZE EX Apply topically.   cephALEXin 500 MG capsule Commonly known as: KEFLEX Take 500 mg by mouth 4 (four) times daily.   Cinnamon 500 MG Tabs Take 1,000 mg by mouth.   diclofenac sodium 1 % Gel Commonly known as: VOLTAREN Apply 2 g topically 4 (four) times daily.   diphenhydrAMINE 25 MG tablet Commonly known as: BENADRYL Take 25 mg by mouth every 6 (six) hours as needed for itching or allergies.   Fish Oil 1000 MG Caps Take 2,000 mg by mouth 2 (two) times daily.   fluticasone 50 MCG/ACT nasal spray Commonly known as: Flonase Place 1 spray into both nostrils daily.   Fluticasone Furoate 200 MCG/ACT Aepb Commonly known as: Arnuity Ellipta Inhale 1 puff into the lungs daily.   Iron 100/C 100-250 MG Tabs Generic drug: Iron-Vitamin C Take 125 mg by mouth.   Lecithin 1200 MG Caps Take 1,000 mg by mouth every morning.   lisinopril-hydrochlorothiazide 20-12.5 MG tablet Commonly known as: ZESTORETIC Take 1 tablet by mouth every morning.   montelukast 10 MG tablet Commonly known as: SINGULAIR  Take 10 mg by mouth at bedtime.   multivitamin with minerals Tabs tablet Take 1 tablet by mouth daily.   OVER THE COUNTER MEDICATION 3 (three) times daily as needed. Lubricating eye drops   Red Yeast Rice 600 MG Caps Take 600 mg by mouth.   Turmeric Curcumin 500 MG Caps Take 1,000 mg by mouth 2 (two) times daily.       Past Medical History:  Diagnosis Date  . Deviated nasal septum    right side, cannot breathe out of right septum  . GERD (gastroesophageal reflux disease)   . Hypertension   . Rheumatoid arthritis ([place])    [place] and [place], [place]  . UTI (lower urinary tract infection) 03/02/2013   treated with keflex for hip surgery on 03/09/13    Past Surgical History:  Procedure Laterality Date  . [place] N/A 04/21/2015  . DIAGNOSTIC MAMMOGRAM Bilateral 04/05/2016  . ingrown toenail removed  5-6 yrs ago  . TOTAL HIP ARTHROPLASTY Right 03/09/2013   Procedure: RIGHT TOTAL HIP ARTHROPLASTY ANTERIOR APPROACH;  Surgeon: Mauri Pole, MD;  Location: [facility];  Service: Orthopedics;  Laterality: Right;    Review of systems negative except as noted in HPI / PMHx or noted below:  Review of Systems  Constitutional: Negative.   HENT: Negative.   Eyes: Negative.   Respiratory: Negative.   Cardiovascular: Negative.   Gastrointestinal: Negative.   Genitourinary: Negative.   Musculoskeletal: Negative.   Skin: Negative.   Neurological: Negative.   Endo/Heme/Allergies: Negative.   Psychiatric/Behavioral: Negative.      Objective:   Vitals:   08/10/18 1151  BP: 104/64  Pulse: 60  Resp: 12  SpO2: 98%          Physical Exam Constitutional:      Appearance: She is not diaphoretic.  HENT:     Head: Normocephalic.     Right Ear: Tympanic membrane, ear canal and external ear normal.     Left Ear: Tympanic membrane, ear canal and external ear normal.     Nose: Nose normal. No mucosal edema or rhinorrhea.     Mouth/Throat:     Pharynx: Uvula midline. No oropharyngeal exudate.  Eyes:     Conjunctiva/sclera: Conjunctivae normal.  Neck:     Thyroid: No thyromegaly.     Trachea: Trachea normal. No tracheal tenderness or tracheal deviation.  Cardiovascular:     Rate and Rhythm: Normal rate and regular rhythm.     Heart sounds: Normal heart sounds, S1 normal and S2 normal. No murmur.  Pulmonary:     Effort: No respiratory distress.     Breath sounds: Normal breath sounds. No stridor. No wheezing or rales.  Lymphadenopathy:     Head:     Right side of head:  No tonsillar adenopathy.     Left side of head: No tonsillar adenopathy.     Cervical: No cervical adenopathy.  Skin:    Findings: No erythema or rash.     Nails: There is no clubbing.   Neurological:     Mental Status: She is alert.     Diagnostics:    Spirometry was performed and demonstrated an FEV1 of 1.59 at 75 % of predicted.  The patient had an Asthma Control Test with the following results:  .    Assessment and Plan:   1. Asthma, moderate persistent, well-controlled   2. Perennial allergic rhinitis   3.  Immunosuppression ([place])     1.  Continue to perform Allergen avoidance measures  2.  Continue to Treat and prevent inflammation:   A.  Flonase 1 spray each nostril 3-7 times per week  B.  Arnuity 200 1 inhalation 3-7 times per week  3.  If needed:   A.  Pro-air HFA 1-2 puffs every 4-6 hours if needed  B.  OTC antihistamine  4.  Return to clinic in 12 months or earlier if problem   5. Obtain fall flu vaccine  [name] [name] is really doing very well.  She has a very good understanding of her disease state and how her medications work and the appropriate dosing of her medications.  She will continue on the lowest amount of Flonase and Arnuity required to control her respiratory tract issue.  I will see her back in this clinic in 1 year or earlier if there is a problem.  Allena Katz, MD Allergy / Immunology [place]

## 2018-08-10 NOTE — Patient Instructions (Addendum)
  1.  Continue to perform Allergen avoidance measures  2.  Continue to Treat and prevent inflammation:   A.  Flonase 1 spray each nostril 3-7 times per week  B.  Arnuity 200 1 inhalation 3-7 times per week  3.  If needed:   A.  Pro-air HFA 1-2 puffs every 4-6 hours if needed  B.  OTC antihistamine  4.  Return to clinic in 12 months or earlier if problem   5. Obtain fall flu vaccine

## 2018-08-11 ENCOUNTER — Encounter: Payer: Self-pay | Admitting: Allergy and Immunology

## 2018-08-31 DIAGNOSIS — D510 Vitamin B12 deficiency anemia due to intrinsic factor deficiency: Secondary | ICD-10-CM | POA: Diagnosis not present

## 2018-09-01 DIAGNOSIS — M0589 Other rheumatoid arthritis with rheumatoid factor of multiple sites: Secondary | ICD-10-CM | POA: Diagnosis not present

## 2018-09-28 DIAGNOSIS — M549 Dorsalgia, unspecified: Secondary | ICD-10-CM | POA: Diagnosis not present

## 2018-09-29 DIAGNOSIS — M0589 Other rheumatoid arthritis with rheumatoid factor of multiple sites: Secondary | ICD-10-CM | POA: Diagnosis not present

## 2018-10-02 DIAGNOSIS — D510 Vitamin B12 deficiency anemia due to intrinsic factor deficiency: Secondary | ICD-10-CM | POA: Diagnosis not present

## 2018-10-08 DIAGNOSIS — I10 Essential (primary) hypertension: Secondary | ICD-10-CM | POA: Diagnosis not present

## 2018-10-08 DIAGNOSIS — Z79899 Other long term (current) drug therapy: Secondary | ICD-10-CM | POA: Diagnosis not present

## 2018-10-08 DIAGNOSIS — M0589 Other rheumatoid arthritis with rheumatoid factor of multiple sites: Secondary | ICD-10-CM | POA: Diagnosis not present

## 2018-10-08 DIAGNOSIS — R5383 Other fatigue: Secondary | ICD-10-CM | POA: Diagnosis not present

## 2018-10-08 DIAGNOSIS — M7070 Other bursitis of hip, unspecified hip: Secondary | ICD-10-CM | POA: Diagnosis not present

## 2018-10-08 DIAGNOSIS — M25511 Pain in right shoulder: Secondary | ICD-10-CM | POA: Diagnosis not present

## 2018-10-27 DIAGNOSIS — M0589 Other rheumatoid arthritis with rheumatoid factor of multiple sites: Secondary | ICD-10-CM | POA: Diagnosis not present

## 2018-11-04 DIAGNOSIS — D510 Vitamin B12 deficiency anemia due to intrinsic factor deficiency: Secondary | ICD-10-CM | POA: Diagnosis not present

## 2018-11-12 ENCOUNTER — Other Ambulatory Visit: Payer: Self-pay | Admitting: Allergy and Immunology

## 2018-11-23 DIAGNOSIS — Z79899 Other long term (current) drug therapy: Secondary | ICD-10-CM | POA: Diagnosis not present

## 2018-11-23 DIAGNOSIS — M069 Rheumatoid arthritis, unspecified: Secondary | ICD-10-CM | POA: Diagnosis not present

## 2018-11-23 DIAGNOSIS — I10 Essential (primary) hypertension: Secondary | ICD-10-CM | POA: Diagnosis not present

## 2018-11-23 DIAGNOSIS — R739 Hyperglycemia, unspecified: Secondary | ICD-10-CM | POA: Diagnosis not present

## 2018-11-23 DIAGNOSIS — Z23 Encounter for immunization: Secondary | ICD-10-CM | POA: Diagnosis not present

## 2018-11-23 DIAGNOSIS — E785 Hyperlipidemia, unspecified: Secondary | ICD-10-CM | POA: Diagnosis not present

## 2018-11-23 DIAGNOSIS — L659 Nonscarring hair loss, unspecified: Secondary | ICD-10-CM | POA: Diagnosis not present

## 2018-11-23 DIAGNOSIS — Z139 Encounter for screening, unspecified: Secondary | ICD-10-CM | POA: Diagnosis not present

## 2018-12-07 DIAGNOSIS — D510 Vitamin B12 deficiency anemia due to intrinsic factor deficiency: Secondary | ICD-10-CM | POA: Diagnosis not present

## 2018-12-08 DIAGNOSIS — M0589 Other rheumatoid arthritis with rheumatoid factor of multiple sites: Secondary | ICD-10-CM | POA: Diagnosis not present

## 2019-01-05 DIAGNOSIS — M0589 Other rheumatoid arthritis with rheumatoid factor of multiple sites: Secondary | ICD-10-CM | POA: Diagnosis not present

## 2019-01-08 DIAGNOSIS — Z6841 Body Mass Index (BMI) 40.0 and over, adult: Secondary | ICD-10-CM | POA: Diagnosis not present

## 2019-01-08 DIAGNOSIS — M069 Rheumatoid arthritis, unspecified: Secondary | ICD-10-CM | POA: Diagnosis not present

## 2019-01-08 DIAGNOSIS — M1990 Unspecified osteoarthritis, unspecified site: Secondary | ICD-10-CM | POA: Diagnosis not present

## 2019-01-08 DIAGNOSIS — D510 Vitamin B12 deficiency anemia due to intrinsic factor deficiency: Secondary | ICD-10-CM | POA: Diagnosis not present

## 2019-01-12 DIAGNOSIS — M79672 Pain in left foot: Secondary | ICD-10-CM | POA: Diagnosis not present

## 2019-01-12 DIAGNOSIS — M722 Plantar fascial fibromatosis: Secondary | ICD-10-CM | POA: Diagnosis not present

## 2019-02-02 DIAGNOSIS — M0589 Other rheumatoid arthritis with rheumatoid factor of multiple sites: Secondary | ICD-10-CM | POA: Diagnosis not present

## 2019-02-02 DIAGNOSIS — M722 Plantar fascial fibromatosis: Secondary | ICD-10-CM | POA: Diagnosis not present

## 2019-02-04 DIAGNOSIS — L570 Actinic keratosis: Secondary | ICD-10-CM | POA: Diagnosis not present

## 2019-02-08 DIAGNOSIS — D510 Vitamin B12 deficiency anemia due to intrinsic factor deficiency: Secondary | ICD-10-CM | POA: Diagnosis not present

## 2019-02-11 ENCOUNTER — Telehealth: Payer: Self-pay | Admitting: Allergy and Immunology

## 2019-02-11 NOTE — Telephone Encounter (Signed)
[name] [name] called and wanted to let the nurses know that starting January 1st she has a pharmacy change and needs 90 days supply on her prescriptions sent to [facility].

## 2019-03-02 ENCOUNTER — Other Ambulatory Visit: Payer: Self-pay | Admitting: Allergy and Immunology

## 2019-03-02 DIAGNOSIS — M0589 Other rheumatoid arthritis with rheumatoid factor of multiple sites: Secondary | ICD-10-CM | POA: Diagnosis not present

## 2019-03-25 ENCOUNTER — Other Ambulatory Visit: Payer: Self-pay | Admitting: *Deleted

## 2019-03-25 ENCOUNTER — Telehealth: Payer: Self-pay | Admitting: Allergy and Immunology

## 2019-03-25 DIAGNOSIS — L659 Nonscarring hair loss, unspecified: Secondary | ICD-10-CM | POA: Diagnosis not present

## 2019-03-25 DIAGNOSIS — I10 Essential (primary) hypertension: Secondary | ICD-10-CM | POA: Diagnosis not present

## 2019-03-25 DIAGNOSIS — M069 Rheumatoid arthritis, unspecified: Secondary | ICD-10-CM | POA: Diagnosis not present

## 2019-03-25 DIAGNOSIS — Z79899 Other long term (current) drug therapy: Secondary | ICD-10-CM | POA: Diagnosis not present

## 2019-03-25 DIAGNOSIS — E785 Hyperlipidemia, unspecified: Secondary | ICD-10-CM | POA: Diagnosis not present

## 2019-03-25 DIAGNOSIS — R739 Hyperglycemia, unspecified: Secondary | ICD-10-CM | POA: Diagnosis not present

## 2019-03-25 MED ORDER — ARNUITY ELLIPTA 200 MCG/ACT IN AEPB: 1.0000 | INHALATION_SPRAY | Freq: Every day | RESPIRATORY_TRACT | 0 refills | Status: DC

## 2019-03-25 NOTE — Telephone Encounter (Signed)
Dr. Neldon Mc please advise in regards to the reactions.

## 2019-03-25 NOTE — Telephone Encounter (Signed)
FYI:  Patient is scheduled for Tuesday February 9th at 8:30 with Dr. Nelva Bush. Will let Dr. Neldon Mc and Dr. Nelva Bush know.

## 2019-03-25 NOTE — Telephone Encounter (Signed)
See below

## 2019-03-25 NOTE — Telephone Encounter (Signed)
Please advise, it looks like patient has had several reactions to previous vaccines and immunizations.

## 2019-03-25 NOTE — Telephone Encounter (Signed)
Patient's PCP told her to call the office for advice on getting the COVID vaccine. Patient does have environmental allergies and medication allergies. Patient has had reactions to vaccines such as the Tdap, but not anaphylaxis. Patient feels shortness of breathing coming on and takes Benadryl. No reactions to Miralax. Patient does not have an EpiPen. Patient has an emergency inhaler, but states it is out of date.    Please advise.

## 2019-03-25 NOTE — Telephone Encounter (Signed)
Please inform patient because after receiving a tetanus vaccination it would be worthwhile to have her go undergo skin testing for a potential vaccine reaction associated with an upcoming Covid administration.  Please arrange for her to see Dr. Phyllis Ginger for testing

## 2019-03-30 ENCOUNTER — Other Ambulatory Visit: Payer: Self-pay

## 2019-03-30 ENCOUNTER — Encounter: Payer: Self-pay | Admitting: Allergy

## 2019-03-30 ENCOUNTER — Ambulatory Visit (INDEPENDENT_AMBULATORY_CARE_PROVIDER_SITE_OTHER): Payer: Medicare Other | Admitting: Allergy

## 2019-03-30 VITALS — BP 122/62 | HR 60 | Resp 14

## 2019-03-30 DIAGNOSIS — J4540 Moderate persistent asthma, uncomplicated: Secondary | ICD-10-CM

## 2019-03-30 DIAGNOSIS — T50Z95D Adverse effect of other vaccines and biological substances, subsequent encounter: Secondary | ICD-10-CM | POA: Diagnosis not present

## 2019-03-30 DIAGNOSIS — J3089 Other allergic rhinitis: Secondary | ICD-10-CM

## 2019-03-30 MED ORDER — ALBUTEROL SULFATE HFA 108 (90 BASE) MCG/ACT IN AERS: INHALATION_SPRAY | RESPIRATORY_TRACT | 1 refills | Status: DC

## 2019-03-30 NOTE — Progress Notes (Signed)
Follow-up Note  RE: [name] [name] MRN: [id number] DOB: 05/24/1950 Date of Office Visit: 03/30/2019   History of present illness: [name] [name] is a 69 y.o. female presenting today for testing for covid vaccine.  She was last seen in the office on 08/10/2018 by Dr. Neldon Mc.  She has history of multiple drug allergy and previous vaccine reactions as well as asthma and allergic rhinitis.  For her asthma she has been recommended to use Arnuity 247mcg 1 puff 3 times a week M/W/F.  She states she did not take her dose yesterday as was not sure if she should for testing today.  She denies any respiratory symptoms and states her albuterol is still in the box unopened as she has not needed to use it.   She also is recommended for her allergic rhinitis to take Flonase 1 sprays each nostril 3 times a week as well and she held this yesterday too.  She states she is in her normal state of health today.  She has not had any antihistamines for testing today.  With her previous reactions she reports shortness of breath and flushing within minutes of receiving Shingrix 07/08/17.  She states she was treated with benadryl and symptoms resolved.  She pretreated herself with Benadryl before she had Shingrix again on 12/30/2017 and had no issues.  With Prevnar on 03-13-2015 she reports having shortness of breath shortly after the injection and again managed with Benadryl.  Tdap on 04/10/2016 again reports having shortness of breath that was managed with Benadryl after.  She also reports having shortness of breath with methotrexate, leflunomide, allopurinol, Asmanex, Mucinex, methylprednisolone and MSG.  She reports having itching and a blistery type rash following sulfa drug, Neosporin.  She states however she has never had frank anaphylaxis to any vaccine or medication.  She does not have an epinephrine device at this time. She states she has received flu vaccines yearly without any issues.  She has not had any  antihistamines for testing today.  She is scheduled to receive the Covid vaccine on 2-4-202 from [facility].  Review of systems: Review of Systems  Constitutional: Negative.   HENT: Negative.   Eyes: Negative.   Respiratory: Negative.   Cardiovascular: Negative.   Gastrointestinal: Negative.   Musculoskeletal: Positive for joint pain.  Skin: Negative.   Neurological: Negative.     All other systems negative unless noted above in HPI  Past medical/social/surgical/family history have been reviewed and are unchanged unless specifically indicated below.  No changes  Medication List: Current Outpatient Medications  Medication Sig Dispense Refill  . acetaminophen (TYLENOL) 500 MG tablet Take 500 mg by mouth every 6 (six) hours as needed.    . ASPERCREME LIDOCAINE EX Apply topically.    Marland Kitchen atenolol (TENORMIN) 50 MG tablet Take 50 mg by mouth every evening.    . cephALEXin (KEFLEX) 500 MG capsule Take 500 mg by mouth 4 (four) times daily. 1 hour prior to procedure    . Cyanocobalamin (B-12 COMPLIANCE INJECTION IJ) Inject 1 Dose as directed. 1 Injection 5 times a month    . diphenhydrAMINE (BENADRYL) 25 MG tablet Take 25 mg by mouth every 6 (six) hours as needed for itching or allergies.    . fluticasone (FLONASE) 50 MCG/ACT nasal spray Place 1 spray into both nostrils daily. 1 g 5  . Fluticasone Furoate (ARNUITY ELLIPTA) 200 MCG/ACT AEPB Inhale 1 puff into the lungs daily. 90 each 0  . Ginger, Zingiber officinalis, (GINGER  PO) Take 550 mg by mouth every morning.    . Iron-Vitamin C 65-125 MG TABS Take by mouth every morning.    . Lecithin 1200 MG CAPS Take 1,200 mg by mouth every morning.     Marland Kitchen lisinopril-hydrochlorothiazide (PRINZIDE,ZESTORETIC) 20-12.5 MG per tablet Take 1 tablet by mouth every morning.    . Menthol, Topical Analgesic, (BIOFREEZE EX) Apply topically.    . Multiple Vitamin (MULTIVITAMIN WITH MINERALS) TABS tablet Take 1 tablet by mouth daily.    Marland Kitchen  OVER THE COUNTER MEDICATION 3 (three) times daily as needed. Lubricating eye drops    . Red Yeast Rice 600 MG CAPS Take 600 mg by mouth 2 (two) times daily.     . Tocilizumab (ACTEMRA IV) Inject 800 mg into the vein.    . Turmeric Curcumin 500 MG CAPS Take 1,000 mg by mouth 2 (two) times daily.    Marland Kitchen VASCEPA 1 g CAPS 2 (TWO) CAPSULE TWO TIMES DAILY    . albuterol (PROVENTIL HFA;VENTOLIN HFA) 108 (90 Base) MCG/ACT inhaler Inhale two puffs every four to six hours as needed for cough or wheeze. 1 Inhaler 1   No current facility-administered medications for this visit.     Known medication allergies: Allergies  Allergen Reactions  . Allopurinol Shortness Of Breath  . Asmanex (120 Metered Doses) [Mometasone Furoate] Shortness Of Breath  . Guaifenesin & Derivatives Shortness Of Breath    MUCINEX  . Methotrexate Derivatives Shortness Of Breath  . Methylprednisolone Shortness Of Breath  . Monosodium Glutamate Shortness Of Breath  . Prednisone Shortness Of Breath  . Prevnar 13 [Pneumococcal 13-Val Conj Vacc] Shortness Of Breath  . Shingrix [Zoster Vac Recomb Adjuvanted] Shortness Of Breath  . Tetanus Toxoids Shortness Of Breath  . Ciprofloxacin Other (See Comments)    Breathing and swallowing after two doses of medications  . Humira [Adalimumab]     SOB  . Leflunomide     SOB  . Neosporin [Neomycin-Bacitracin Zn-Polymyx] Rash  . Sulfa Antibiotics Itching and Rash     Physical examination: Blood pressure 122/62, pulse 60, resp. rate 14, SpO2 97 %.  General: Alert, interactive, in no acute distress. HEENT: PERRLA, TMs pearly gray, turbinates minimally edematous without discharge, post-pharynx non erythematous. Neck: Supple without lymphadenopathy. Lungs: Clear to auscultation without wheezing, rhonchi or rales. {no increased work of breathing. CV: Normal S1, S2 without murmurs. Abdomen: Nondistended, nontender. Skin: Warm and dry, without lesions or rashes. Extremities:  No clubbing,  cyanosis or edema. Neuro:   Grossly intact.  Diagnositics/Labs:  Spirometry: FEV1: 1.42 L 68%, FVC: 1.78 L 64% predicted.  Following albuterol there was not any significant change in FEV1 or FVC.  Allergy testing: Covid vaccine component testing  Skin prick testing- Histamine (1.8 mg/mL) puncture: positive  Control (negative - HSA) puncture: negative  Triamcinolone puncture: negative  Methylprednisolone puncture: negative  Miralax puncture (1:100 or 1.7 mg/mL): negative  Miralax puncture (1:10 or 17 mg/mL): negative  Miralax puncture (1:1 or 170 mg/mL): negative   Intradermal testing- Control (negative - HSA) intradermal: negative  Triamcinolone (1:100): negative  Methyprednisolone (1:100): negative  Triamcinolone (1:10): negative  Methylprednisolone (1:10): negative  Triamcinolone (1:1): negative    Allergy testing results were read and interpreted by provider, documented by clinical staff.   Graded oral challenge to MiraLAX 170mg /ml. Benefits and risks of challenge discussed and verbal consent from mother obtained.    She was provided with doses equivalent to 1%, 10% and 90% for total amount of 30.71ml.  Doses provided every 20 minutes.    Vital signs taken between doses remained stable.  She was observed for additional hour after completion of ingestion challenge.  She had no signs/symptoms of allergic reaction.  Vitals were obtained prior to discharge and remained stable.     Assessment and plan:   History of vaccine reaction  -Allergy testing to Covid vaccine components performed today due to previous history of vaccine reactions.  -Polyethylene glycol is the most common allergic component found in the mRNA based Covid-19 vaccines that are available at this time.  - Testing today is negative!  -With negative skin testing and oral challenge you are at the same risk as the general population of having allergic reaction to the mRNA based Covid-19 vaccines.  Based on today's  negative testing the benefits of receiving an mRNA based Covid-19 vaccine outweigh the risks associated with receiving the vaccine and thus I recommend that you be vaccinated.    -it is recommended that you take an antihistamine (a non-drowsy option like Xyzal 5mg ) 1 hour prior to your vaccine schedule time.  Also recommend you take your albuterol rescue inhaler with you as well as benadryl as precaution.   Use your Arnuity inhaler the morning of your vaccine.  Advised to wait 30 minutes after vaccination for observation.  Moderate persistent asthma  -Continue Arnuity 200 mcg 1 puff 3 times a week  -have access to albuterol inhaler 2 puffs every 4-6 hours as needed for cough/wheeze/shortness of breath/chest tightness.  May use 15-20 minutes prior to activity.   Monitor frequency of use.    Asthma control goals:   Full participation in all desired activities (may need albuterol before activity)  Albuterol use two time or less a week on average (not counting use with activity)  Cough interfering with sleep two time or less a month  Oral steroids no more than once a year  No hospitalizations  Allergic rhinitis  -Continue appropriate allergen avoidance measures  -Continue Flonase 1 spray each nostril 3 times a week  -Over-the-counter antihistamine as needed    Follow-up this summer 2021 with Dr. Neldon Mc or sooner if needed   I appreciate the opportunity to take part in [name] [name]'s care. Please do not hesitate to contact me with questions.  Sincerely,   Prudy Feeler, MD Allergy/Immunology [facility]

## 2019-03-30 NOTE — Patient Instructions (Addendum)
History of vaccine reaction  -Allergy testing to Covid vaccine components performed today due to previous history of vaccine reactions.  -Polyethylene glycol is the most common allergic component found in the mRNA based Covid-19 vaccines that are available at this time.  - Testing today is negative!  -With negative skin testing and oral challenge you are at the same risk as the general population of having allergic reaction to the RNA based Covid-19 vaccines.  Based on today's negative testing the benefits of receiving an mRNA based Covid-19 vaccine outweigh the risks associated with receiving the vaccine and thus I recommend that you be vaccinated.    -it is recommended that you take an antihistamine (a non-drowsy option like Xyzal 5mg ) 1 hour prior to your vaccine schedule time.  Also recommend you take your albuterol rescue inhaler with you as well as benadryl as precaution.   Use your Arnuity inhaler the morning of your vaccine    For the Health department - she has tested negative for allergenic components of the Covid-19 vaccine and should be allowed to receive the vaccination with a 30 minute wait period afterwards.     Follow-up this summer 2021 with Dr. Neldon Mc or sooner if needed

## 2019-04-01 ENCOUNTER — Other Ambulatory Visit: Payer: Self-pay | Admitting: *Deleted

## 2019-04-06 ENCOUNTER — Encounter: Payer: Self-pay | Admitting: Allergy

## 2019-04-06 NOTE — Progress Notes (Signed)
Office Visit Note  Patient: [name] [name]             Date of Birth: 11-12-1950           MRN: [id number]             PCP: Lowella Dandy, NP Referring: Lowella Dandy, NP Visit Date: 04/07/2019 Occupation: '@GUAROCC'$ @  Subjective:  Rheumatoid arthritis, medication monitoring.   History of Present Illness: [name] [name] is a 69 y.o. female seen in consultation per request of her PCP.  According to patient her symptoms are started about 20 years ago with pain in her arms and her knee joints.  At the time she was seen by Dr. Justine Null who diagnosed her with rheumatoid arthritis and started her on prednisone.  She states she took prednisone for about 1 year and then decided to come off the medication and did not return for follow-up.  She states her symptoms got worse again and she started seeing Dr. Ouida Sills.  She also underwent right total hip replacement in January 2015 by Dr. Alvan Dame.  She states Dr. Ouida Sills tried methotrexate and Jolee Ewing which caused shortness of breath and was discontinued.  She took Enbrel but did not like the shots that they were very painful.  She also tried Humira which caused shortness of breath and memory loss.  She was switched to Actemra IV in 2016 which has worked really well for her.  She has been under care of Dr. Dossie Der since 2017.  She states she has occasional flares in her right shoulder joint.  She was recently placed on omega-3 by her PCP which has helped her joint symptoms.  She also acquired right ankle joint injury and a torn cartilage for which she was initially seen by Dr. Alvan Dame and then by podiatrist.  She has had physical therapy and has tried topical agents.  She has not recently noticed a contracture in her left elbow joint for which she was given prednisone taper she took it for 1-1/2-day and then stopped as she was getting Covid vaccine.  None of the other joints are painful.  Activities of Daily Living:  Patient reports morning stiffness for 0 minutes.   Patient  Denies nocturnal pain.  Difficulty dressing/grooming: Denies Difficulty climbing stairs: Reports Difficulty getting out of chair: Denies Difficulty using hands for taps, buttons, cutlery, and/or writing: Reports  Review of Systems  Constitutional: Negative for fatigue, night sweats, weight gain and weight loss.       Uses a cane due to knee joint arthritis.  HENT: Negative for mouth sores, trouble swallowing, trouble swallowing, mouth dryness and nose dryness.   Eyes: Negative for pain, redness, itching, visual disturbance and dryness.  Respiratory: Negative for cough, shortness of breath and difficulty breathing.   Cardiovascular: Negative for chest pain, palpitations, hypertension, irregular heartbeat and swelling in legs/feet.  Gastrointestinal: Negative for blood in stool, constipation and diarrhea.  Endocrine: Negative for increased urination.  Genitourinary: Negative for difficulty urinating, painful urination and vaginal dryness.  Musculoskeletal: Positive for arthralgias and joint pain. Negative for joint swelling, myalgias, muscle weakness, morning stiffness, muscle tenderness and myalgias.  Skin: Negative for color change, rash, hair loss, skin tightness, ulcers and sensitivity to sunlight.  Allergic/Immunologic: Negative for susceptible to infections.  Neurological: Negative for dizziness, headaches, memory loss, night sweats and weakness.  Hematological: Negative for bruising/bleeding tendency and swollen glands.  Psychiatric/Behavioral: Negative for depressed mood, confusion and sleep disturbance. The patient is not nervous/anxious.  PMFS History:  Patient Active Problem List   Diagnosis Date Noted  . Dyspnea 11/05/2013  . Rheumatoid arthritis ([place])   . Hypertension   . GERD (gastroesophageal reflux disease)   . Morbid obesity ([place]) 03/10/2013  . S/P right THA, AA 03/09/2013    Past Medical History:  Diagnosis Date  . Deviated nasal septum    right side, cannot  breathe out of right septum  . GERD (gastroesophageal reflux disease)   . Hypertension   . Plantar fasciitis   . Rheumatoid arthritis ([place])    oa and ra in arms, shane and[facility]  . UTI (lower urinary tract infection) 03/02/2013   treated with keflex for hip surgery on 03/09/13    Family History  Problem Relation Age of Onset  . Heart disease Father   . Kidney disease Father   . Heart disease Brother   . Cancer Brother   . Rheum arthritis Maternal Aunt   . Hypertension Mother   . Alzheimer's disease Mother   . Heart attack Brother   . Vascular Disease Brother   . Kidney disease Brother    Past Surgical History:  Procedure Laterality Date  . [place] N/A 04/21/2015  . DIAGNOSTIC MAMMOGRAM Bilateral 04/05/2016  . ingrown toenail removed  5-6 yrs ago  . TOTAL HIP ARTHROPLASTY Right 03/09/2013   Procedure: RIGHT TOTAL HIP ARTHROPLASTY ANTERIOR APPROACH;  Surgeon: Mauri Pole, MD;  Location: [facility];  Service: Orthopedics;  Laterality: Right;   Social History   Social History Narrative  . Not on file   Immunization History  Administered Date(s) Administered  . Influenza Split 12/26/2012  . Influenza-Unspecified 11/29/2013  . Pneumococcal Polysaccharide-23 03/11/2013     Objective: Vital Signs: BP 135/72 (BP Location: Right Wrist, Patient Position: Sitting, Cuff Size: Normal)   Pulse 65   Resp 16   Ht '5\' 2"'$  (1.575 m)   Wt 258 lb (117 kg)   BMI 47.19 kg/m    Physical Exam Vitals and nursing note reviewed.  Constitutional:      Appearance: She is well-developed.  HENT:     Head: Normocephalic and atraumatic.  Eyes:     Conjunctiva/sclera: Conjunctivae normal.  Cardiovascular:     Rate and Rhythm: Normal rate and regular rhythm.     Heart sounds: Normal heart sounds.  Pulmonary:     Effort: Pulmonary effort is normal.     Breath sounds: Normal breath sounds.  Abdominal:     General: Bowel sounds are normal.      Palpations: Abdomen is soft.  Musculoskeletal:     Cervical back: Normal range of motion.  Lymphadenopathy:     Cervical: No cervical adenopathy.  Skin:    General: Skin is warm and dry.     Capillary Refill: Capillary refill takes less than 2 seconds.  Neurological:     Mental Status: She is alert and oriented to person, place, and time.  Psychiatric:        Behavior: Behavior normal.      Musculoskeletal Exam: She has good range of motion of her cervical spine.  Shoulder joints with good range of motion.  She has mild contracture in her left elbow joint.  Wrist joints MCPs PIPs DIPs with good range of motion with no synovitis.  Her right hip joint is replaced which was in good range of motion.  Left hip joint was in good range of motion.  She had discomfort  range of motion of bilateral knee joints without any warmth swelling or effusion.  No ankle joint MTP PIP and DIP narrowing was noted.  CDAI Exam: CDAI Score: 2.4  Patient Global: 2 mm; Provider Global: 2 mm Swollen: 0 ; Tender: 2  Joint Exam 04/07/2019      Right  Left  Knee   Tender   Tender     Investigation: No additional findings.  Imaging: No results found.  Recent Labs: Lab Results  Component Value Date   WBC 11.3 (H) 03/11/2013   HGB 9.0 (L) 03/11/2013   PLT 402 (H) 03/11/2013   NA 139 03/11/2013   K 4.4 03/11/2013   CL 103 03/11/2013   CO2 26 03/11/2013   GLUCOSE 95 03/11/2013   BUN 22 03/11/2013   CREATININE 1.03 03/11/2013   CALCIUM 8.9 03/11/2013   GFRAA 66 (L) 03/11/2013  March 26, 2019 CMP with GFR showed GFR 38 creatinine 1.40, September 01, 2018 hepatitis A, hepatitis B and hepatitis C were normal  Speciality Comments: No specialty comments available.  Procedures:  No procedures performed Allergies: Allopurinol, Asmanex (120 metered doses) [mometasone furoate], Guaifenesin & derivatives, Methotrexate derivatives, Methylprednisolone, Monosodium glutamate, Prednisone, Prevnar 13 [pneumococcal  13-val conj vacc], Shingrix [zoster vac recomb adjuvanted], Tetanus toxoids, Ciprofloxacin, Humira [adalimumab], Leflunomide, Neosporin [neomycin-bacitracin zn-polymyx], and Sulfa antibiotics   Assessment / Plan:     Visit Diagnoses: Rheumatoid arthritis with rheumatoid factor of multiple sites without organ or systems involvement (HCC) - Dx 20 years ago, Followed by Dr. Dossie Der. 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 ESR 18, CRP<1 -patient does not have much synovitis on examination today.  She states she has been doing well on Actemra.  She has mild left elbow joint contracture which she states is recent.  She was having flares in her right shoulder joint per patient.  She states the symptoms have resolved since she has been taking omega-3.  Plan: XR Hand 2 View Right, XR Hand 2 View Left, XR Foot 2 Views Right, XR Foot 2 Views Left.  X-rays of bilateral hands and her feet were reviewed with the patient.  The findings were consistent with severe rheumatoid arthritis with erosive changes.  High risk medication use - Actemra 400 mg/20 ml IV q4wk (started in 2016). Held by pulm due to dyspnea-Humira.  D/c MTX and Arava-SOB.  Enbrel-inj site rxn.  Medication Counseling:  CBC    Component Value Date/Time   WBC 11.3 (H) 03/11/2013 0540   RBC 3.21 (L) 03/11/2013 0540   HGB 9.0 (L) 03/11/2013 0540   HCT 28.3 (L) 03/11/2013 0540   PLT 402 (H) 03/11/2013 0540   MCV 88.2 03/11/2013 0540   MCH 28.0 03/11/2013 0540   MCHC 31.8 03/11/2013 0540   RDW 14.4 03/11/2013 0540    CMP     Component Value Date/Time   NA 139 03/11/2013 0540   K 4.4 03/11/2013 0540   CL 103 03/11/2013 0540   CO2 26 03/11/2013 0540   GLUCOSE 95 03/11/2013 0540   BUN 22 03/11/2013 0540   CREATININE 1.03 03/11/2013 0540   CALCIUM 8.9 03/11/2013 0540   GFRNONAA 57 (L) 03/11/2013 0540   GFRAA 66 (L) 03/11/2013 0540     Chest x-ray: Pending  Counseled patient that Actemra is an IL-6 blocking agent.  Reviewed Actemra dose of 4 mg/kg every  4 weeks.  Counseled patient on purpose, proper use, and adverse effects of Actemra.  Reviewed the most common adverse effects including infections, injection site reaction, bowel injury, and  rarely cancer and conditions of the nervous system.  Reviewed that the medication should be held during infections.  Discussed that there is the possibility of an increased risk of malignancy but it is not well understood if this increased risk is due to the medication or the disease state.  Counseled patient that Actemra should be held prior to scheduled surgery.  Counseled patient to avoid live vaccines while on Actemra.  Recommend patient to get annual influenza vaccine, pneumococcal vaccine and Shingrix as indicated.   Reviewed the importance of regular labs while on Actemra therapy including the need for routine lipid panel. Standing orders placed.  Provided patient with medication education material and answered all questions.  Patient voiced understanding.  Patient consented to Actemra.  Will upload consent into the media tab.  Will submit benefits investigation for Actemra.    Status post total hip replacement, right-by Dr. Alvan Dame in January 2015 patient's been doing well.  Chronic pain of both knees -she has discomfort in her knee joints.  No warmth swelling or effusion was noted.  Plan: XR KNEE 3 VIEW RIGHT, XR KNEE 3 VIEW LEFT.  The x-rays were reviewed with the patient.  She has severe osteoarthritis and rheumatoid arthritis overlap in bilateral knee joints with severe chondromalacia patella.  Positive TB test - Tx with INH for 9 months at [place] hypertension-blood pressure is controlled.  History of gastroesophageal reflux (GERD)  History of hyperlipidemia -patient has been treated by her PCP.  Last lipid panel was on March 26, 2019 with LDL of 115.  History of anemia  Former smoker  Family history of rheumatoid arthritis - Maternal aunt  Orders: Orders Placed This Encounter    Procedures  . XR Hand 2 View Right  . XR Hand 2 View Left  . XR KNEE 3 VIEW RIGHT  . XR KNEE 3 VIEW LEFT  . XR Foot 2 Views Right  . XR Foot 2 Views Left   No orders of the defined types were placed in this encounter.   Face-to-face time spent with patient was 60 minutes. Greater than 50% of time was spent in counseling and coordination of care.  Follow-Up Instructions: Return in about 3 months (around 07/05/2019) for Rheumatoid arthritis.   Bo Merino, MD  Note - This record has been created using Editor, commissioning.  Chart creation errors have been sought, but may not always  have been located. Such creation errors do not reflect on  the standard of medical care.

## 2019-04-07 ENCOUNTER — Ambulatory Visit (INDEPENDENT_AMBULATORY_CARE_PROVIDER_SITE_OTHER): Payer: Medicare Other

## 2019-04-07 ENCOUNTER — Ambulatory Visit: Payer: Self-pay

## 2019-04-07 ENCOUNTER — Encounter: Payer: Self-pay | Admitting: Rheumatology

## 2019-04-07 ENCOUNTER — Telehealth: Payer: Self-pay

## 2019-04-07 ENCOUNTER — Other Ambulatory Visit: Payer: Self-pay

## 2019-04-07 ENCOUNTER — Ambulatory Visit (INDEPENDENT_AMBULATORY_CARE_PROVIDER_SITE_OTHER): Payer: Medicare Other | Admitting: Rheumatology

## 2019-04-07 VITALS — BP 135/72 | HR 65 | Resp 16 | Ht 62.0 in | Wt 258.0 lb

## 2019-04-07 DIAGNOSIS — Z8261 Family history of arthritis: Secondary | ICD-10-CM | POA: Diagnosis not present

## 2019-04-07 DIAGNOSIS — I10 Essential (primary) hypertension: Secondary | ICD-10-CM

## 2019-04-07 DIAGNOSIS — G8929 Other chronic pain: Secondary | ICD-10-CM

## 2019-04-07 DIAGNOSIS — M25561 Pain in right knee: Secondary | ICD-10-CM | POA: Diagnosis not present

## 2019-04-07 DIAGNOSIS — R7611 Nonspecific reaction to tuberculin skin test without active tuberculosis: Secondary | ICD-10-CM

## 2019-04-07 DIAGNOSIS — M25562 Pain in left knee: Secondary | ICD-10-CM | POA: Diagnosis not present

## 2019-04-07 DIAGNOSIS — Z96641 Presence of right artificial hip joint: Secondary | ICD-10-CM

## 2019-04-07 DIAGNOSIS — M0579 Rheumatoid arthritis with rheumatoid factor of multiple sites without organ or systems involvement: Secondary | ICD-10-CM

## 2019-04-07 DIAGNOSIS — Z87891 Personal history of nicotine dependence: Secondary | ICD-10-CM | POA: Diagnosis not present

## 2019-04-07 DIAGNOSIS — Z8639 Personal history of other endocrine, nutritional and metabolic disease: Secondary | ICD-10-CM

## 2019-04-07 DIAGNOSIS — Z8719 Personal history of other diseases of the digestive system: Secondary | ICD-10-CM

## 2019-04-07 DIAGNOSIS — Z79899 Other long term (current) drug therapy: Secondary | ICD-10-CM

## 2019-04-07 DIAGNOSIS — Z862 Personal history of diseases of the blood and blood-forming organs and certain disorders involving the immune mechanism: Secondary | ICD-10-CM | POA: Diagnosis not present

## 2019-04-07 NOTE — Telephone Encounter (Signed)
[facility]- please apply for actemra IV. Patient has been on the infusions through Dr. Audelia Hives office. (we have obtained consent)  Seth Bake- please place infusion order, pending: labs, chest xray and insurance approval.   Thanks ladies!

## 2019-04-07 NOTE — Telephone Encounter (Signed)
Findings of benefits investigation for Actemra IV- [id number] :  Insurance: Medicare/ BCBS Supplement plan G  Phone: [phone]  Plan are ACTIVE  Medicare covers 80% of the infusion and no authorization is required, and the supplement would cover the 20% of the cost that was not paid for by Medicare as long as Medicare covered the medication after patient pays her $203 deductible  3:18 PM Beatriz Chancellor, CPhT

## 2019-04-08 NOTE — Telephone Encounter (Signed)
Awaiting lab results and chest x-ray prior to placing infusion orders.

## 2019-04-08 NOTE — Progress Notes (Signed)
Patient has known history of rheumatoid arthritis.  Her labs are consistent with rheumatoid arthritis.

## 2019-04-12 ENCOUNTER — Encounter: Payer: Self-pay | Admitting: Rheumatology

## 2019-04-12 ENCOUNTER — Telehealth: Payer: Self-pay | Admitting: Rheumatology

## 2019-04-12 DIAGNOSIS — R7611 Nonspecific reaction to tuberculin skin test without active tuberculosis: Secondary | ICD-10-CM | POA: Diagnosis not present

## 2019-04-12 DIAGNOSIS — Z9225 Personal history of immunosupression therapy: Secondary | ICD-10-CM | POA: Diagnosis not present

## 2019-04-12 DIAGNOSIS — I700 Atherosclerosis of aorta: Secondary | ICD-10-CM | POA: Diagnosis not present

## 2019-04-12 LAB — QUANTIFERON-TB GOLD PLUS
Mitogen-NIL: >10 IU/mL
NIL: 0.03 IU/mL
QuantiFERON-TB Gold Plus: NEGATIVE
TB1-NIL: 0.01 IU/mL
TB2-NIL: 0.01 IU/mL

## 2019-04-12 LAB — CBC WITH DIFFERENTIAL/PLATELET
Absolute Monocytes: 535 cells/uL (ref 200–950)
Basophils Absolute: 20 cells/uL (ref 0–200)
Basophils Relative: 0.2 %
Eosinophils Absolute: 168 cells/uL (ref 15–500)
Eosinophils Relative: 1.7 %
HCT: 39.7 % (ref 35.0–45.0)
Hemoglobin: 13.5 g/dL (ref 11.7–15.5)
Lymphs Abs: 1733 cells/uL (ref 850–3900)
MCH: 32.5 pg (ref 27.0–33.0)
MCHC: 34 g/dL (ref 32.0–36.0)
MCV: 95.7 fL (ref 80.0–100.0)
MPV: 9.2 fL (ref 7.5–12.5)
Monocytes Relative: 5.4 %
Neutro Abs: 7445 cells/uL (ref 1500–7800)
Neutrophils Relative %: 75.2 %
Platelets: 279 10*3/uL (ref 140–400)
RBC: 4.15 10*6/uL (ref 3.80–5.10)
RDW: 11.9 % (ref 11.0–15.0)
Total Lymphocyte: 17.5 %
WBC: 9.9 10*3/uL (ref 3.8–10.8)

## 2019-04-12 LAB — HIV ANTIBODY (ROUTINE TESTING W REFLEX): HIV 1&2 Ab, 4th Generation: NONREACTIVE

## 2019-04-12 LAB — PROTEIN ELECTROPHORESIS, SERUM, WITH REFLEX
Albumin ELP: 3.9 g/dL (ref 3.8–4.8)
Alpha 1: 0.2 g/dL (ref 0.2–0.3)
Alpha 2: 0.6 g/dL (ref 0.5–0.9)
Beta 2: 0.4 g/dL (ref 0.2–0.5)
Beta Globulin: 0.4 g/dL (ref 0.4–0.6)
Gamma Globulin: 0.8 g/dL (ref 0.8–1.7)
Total Protein: 6.4 g/dL (ref 6.1–8.1)

## 2019-04-12 LAB — CYCLIC CITRUL PEPTIDE ANTIBODY, IGG: Cyclic Citrullin Peptide Ab: 117 UNITS — ABNORMAL HIGH

## 2019-04-12 LAB — IFE INTERPRETATION: Immunofix Electr Int: NOT DETECTED

## 2019-04-12 LAB — IGG, IGA, IGM
IgG (Immunoglobin G), Serum: 869 mg/dL (ref 600–1540)
IgM, Serum: 72 mg/dL (ref 50–300)
Immunoglobulin A: 410 mg/dL — ABNORMAL HIGH (ref 70–320)

## 2019-04-12 LAB — SEDIMENTATION RATE: Sed Rate: 6 mm/h (ref 0–30)

## 2019-04-12 LAB — RHEUMATOID FACTOR: Rheumatoid fact SerPl-aCnc: 42 IU/mL — ABNORMAL HIGH (ref <14)

## 2019-04-12 NOTE — Telephone Encounter (Signed)
I am not sure about [facility]. I do not believe they are not [facility]-affiliated. Patient's insurance does not require pre-certification, so she will just need to see if they offer that service. Or if she can locate a local outpatient infusion center near her home.  2:07 PM Beatriz Chancellor, CPhT

## 2019-04-12 NOTE — Telephone Encounter (Signed)
Patient called checking if she could schedule her infusion at [facility].  Patient requested a return call.

## 2019-04-12 NOTE — Telephone Encounter (Signed)
Advised patient and she is fine with the infusion being done at [facility].   Thanks for your help!

## 2019-04-12 NOTE — Telephone Encounter (Signed)
We are still waiting on chest xray results before placing infusion order.   I advised patient I am unsure if we can order an infusion at [facility]. Patient understands but would like to schedule it closer to home. Is this something we can do?

## 2019-04-12 NOTE — Telephone Encounter (Signed)
No problem.

## 2019-04-12 NOTE — Telephone Encounter (Signed)
Awaiting chest xray results.

## 2019-04-13 ENCOUNTER — Telehealth: Payer: Self-pay

## 2019-04-13 NOTE — Telephone Encounter (Signed)
Received chest x-ray results, will place infusion orders tomorrow (04/14/2019) and call patient with number to schedule.   Patient is aware.

## 2019-04-13 NOTE — Telephone Encounter (Signed)
Received chest x-ray results from [facility].  04/12/2019  Impression: no active disease.   Dr. Estanislado Pandy reviewed, will send document to scan center.   Called patient and advised, patient verbalized understanding.

## 2019-04-14 ENCOUNTER — Other Ambulatory Visit: Payer: Self-pay | Admitting: *Deleted

## 2019-04-14 DIAGNOSIS — D510 Vitamin B12 deficiency anemia due to intrinsic factor deficiency: Secondary | ICD-10-CM | POA: Diagnosis not present

## 2019-04-14 DIAGNOSIS — M0579 Rheumatoid arthritis with rheumatoid factor of multiple sites without organ or systems involvement: Secondary | ICD-10-CM

## 2019-04-14 NOTE — Telephone Encounter (Signed)
Infusion orders placed and patient advised. Provided patient with number to infusion center.

## 2019-05-03 ENCOUNTER — Other Ambulatory Visit: Payer: Self-pay | Admitting: *Deleted

## 2019-05-03 ENCOUNTER — Telehealth: Payer: Self-pay | Admitting: Rheumatology

## 2019-05-03 DIAGNOSIS — Z6841 Body Mass Index (BMI) 40.0 and over, adult: Secondary | ICD-10-CM | POA: Diagnosis not present

## 2019-05-03 DIAGNOSIS — D510 Vitamin B12 deficiency anemia due to intrinsic factor deficiency: Secondary | ICD-10-CM | POA: Diagnosis not present

## 2019-05-03 NOTE — Telephone Encounter (Signed)
Spoke with patient and she states she is on IV Actemra. Advised patient I will place orders so she may schedule her infusion.

## 2019-05-03 NOTE — Telephone Encounter (Signed)
Patient request rx for Actemra to be sent to [facility].

## 2019-05-03 NOTE — Progress Notes (Signed)
Infusion orders are current for patient CBC CMP Tylenol Benadryl appointments are up to date and follow up appointment  is scheduled TB gold not due yet.  

## 2019-05-04 ENCOUNTER — Telehealth: Payer: Self-pay | Admitting: *Deleted

## 2019-05-04 NOTE — Telephone Encounter (Signed)
Patient is allergic to Prednisone. Prescription has not been sent to the pharmacy. Patient advised per Dr. Estanislado Pandy okay to take Aleve or Ibuprofen as her LFTs are normal. Patient advised she may scheduled her Actemra infusion for this week.

## 2019-05-04 NOTE — Telephone Encounter (Signed)
Patient states she last had her Actemra infusion March 02, 2019. Patient states she has had her Covid vaccination in the mean time. Patient states she has her infusion scheduled for next week. Patient states she is flaring and is having hard time walking. Patient states her back is starting to hurt. Patient states she is having pain and swelling in her left wrist. Patient states she is also "swan finger" in the right index finger.  Patient states is asking about taking NSAIDS or if there is something else you would recommend. Please advise.

## 2019-05-04 NOTE — Telephone Encounter (Signed)
Okay to give prednisone taper starting at 20 mg and taper by 5 mg every 4 days.

## 2019-05-10 DIAGNOSIS — Z0000 Encounter for general adult medical examination without abnormal findings: Secondary | ICD-10-CM | POA: Diagnosis not present

## 2019-05-10 DIAGNOSIS — E669 Obesity, unspecified: Secondary | ICD-10-CM | POA: Diagnosis not present

## 2019-05-10 DIAGNOSIS — N959 Unspecified menopausal and perimenopausal disorder: Secondary | ICD-10-CM | POA: Diagnosis not present

## 2019-05-10 DIAGNOSIS — E785 Hyperlipidemia, unspecified: Secondary | ICD-10-CM | POA: Diagnosis not present

## 2019-05-10 DIAGNOSIS — Z1331 Encounter for screening for depression: Secondary | ICD-10-CM | POA: Diagnosis not present

## 2019-05-10 DIAGNOSIS — Z1231 Encounter for screening mammogram for malignant neoplasm of breast: Secondary | ICD-10-CM | POA: Diagnosis not present

## 2019-05-10 DIAGNOSIS — Z136 Encounter for screening for cardiovascular disorders: Secondary | ICD-10-CM | POA: Diagnosis not present

## 2019-05-12 ENCOUNTER — Ambulatory Visit (HOSPITAL_COMMUNITY): Payer: Medicare Other

## 2019-05-13 ENCOUNTER — Other Ambulatory Visit: Payer: Self-pay

## 2019-05-13 ENCOUNTER — Ambulatory Visit (HOSPITAL_COMMUNITY)
Admission: RE | Admit: 2019-05-13 | Discharge: 2019-05-13 | Disposition: A | Discharge status: Home / Self Care | Payer: Medicare Other | Source: Ambulatory Visit | Attending: Rheumatology | Admitting: Rheumatology

## 2019-05-13 DIAGNOSIS — M0579 Rheumatoid arthritis with rheumatoid factor of multiple sites without organ or systems involvement: Secondary | ICD-10-CM

## 2019-05-13 LAB — CBC
HCT: 33.7 % — ABNORMAL LOW (ref 36.0–46.0)
Hemoglobin: 10.7 g/dL — ABNORMAL LOW (ref 12.0–15.0)
MCH: 31.9 pg (ref 26.0–34.0)
MCHC: 31.8 g/dL (ref 30.0–36.0)
MCV: 100.6 fL — ABNORMAL HIGH (ref 80.0–100.0)
Platelets: 569 10*3/uL — ABNORMAL HIGH (ref 150–400)
RBC: 3.35 MIL/uL — ABNORMAL LOW (ref 3.87–5.11)
RDW: 12.1 % (ref 11.5–15.5)
WBC: 10.8 10*3/uL — ABNORMAL HIGH (ref 4.0–10.5)
nRBC: 0 % (ref 0.0–0.2)

## 2019-05-13 LAB — COMPREHENSIVE METABOLIC PANEL
ALT: 18 U/L (ref 0–44)
AST: 19 U/L (ref 15–41)
Albumin: 2.8 g/dL — ABNORMAL LOW (ref 3.5–5.0)
Alkaline Phosphatase: 68 U/L (ref 38–126)
Anion gap: 11 (ref 5–15)
BUN: 20 mg/dL (ref 8–23)
CO2: 24 mmol/L (ref 22–32)
Calcium: 9 mg/dL (ref 8.9–10.3)
Chloride: 104 mmol/L (ref 98–111)
Creatinine, Ser: 1.27 mg/dL — ABNORMAL HIGH (ref 0.44–1.00)
GFR calc Af Amer: 50 mL/min — ABNORMAL LOW (ref >60)
GFR calc non Af Amer: 43 mL/min — ABNORMAL LOW (ref >60)
Glucose, Bld: 112 mg/dL — ABNORMAL HIGH (ref 70–99)
Potassium: 4.6 mmol/L (ref 3.5–5.1)
Sodium: 139 mmol/L (ref 135–145)
Total Bilirubin: 0.4 mg/dL (ref 0.3–1.2)
Total Protein: 6.7 g/dL (ref 6.5–8.1)

## 2019-05-13 MED ORDER — ACETAMINOPHEN 325 MG PO TABS
ORAL_TABLET | ORAL | Status: AC
  Filled 2019-05-13: qty 2

## 2019-05-13 MED ORDER — DIPHENHYDRAMINE HCL 25 MG PO CAPS
ORAL_CAPSULE | ORAL | Status: AC
  Filled 2019-05-13: qty 1

## 2019-05-13 MED ORDER — TOCILIZUMAB 400 MG/20ML IV SOLN
4.0000 mg/kg | INTRAVENOUS | Status: DC
  Administered 2019-05-13: 472 mg via INTRAVENOUS
  Filled 2019-05-13: qty 23.6

## 2019-05-13 MED ORDER — ACETAMINOPHEN 325 MG PO TABS: 650.0000 mg | ORAL_TABLET | ORAL | Status: DC

## 2019-05-13 MED ORDER — DIPHENHYDRAMINE HCL 25 MG PO CAPS: 25.0000 mg | ORAL_CAPSULE | ORAL | Status: DC

## 2019-05-13 NOTE — Progress Notes (Signed)
Patient is on Actemra.  Creatinine is mildly elevated which could be an effect of Actemra.  We will continue to monitor.  Please advise patient not to take any anti-inflammatories.  Glucose is mildly elevated probably not a fasting sample.  Hemoglobin is improved.  Platelets have increased.  We will monitor with the follow-up labs.

## 2019-05-27 ENCOUNTER — Other Ambulatory Visit: Payer: Self-pay | Admitting: *Deleted

## 2019-05-27 NOTE — Progress Notes (Signed)
Infusion orders are current for patient CBC CMP Tylenol Benadryl appointments are up to date and follow up appointment  is scheduled TB gold not due yet.  

## 2019-06-03 DIAGNOSIS — D510 Vitamin B12 deficiency anemia due to intrinsic factor deficiency: Secondary | ICD-10-CM | POA: Diagnosis not present

## 2019-06-08 ENCOUNTER — Other Ambulatory Visit: Payer: Self-pay | Admitting: *Deleted

## 2019-06-08 NOTE — Progress Notes (Signed)
Infusion orders are current for patient CBC CMP Tylenol Benadryl appointments are up to date and follow up appointment  is scheduled TB gold not due yet.  

## 2019-06-10 ENCOUNTER — Other Ambulatory Visit: Payer: Self-pay

## 2019-06-10 ENCOUNTER — Ambulatory Visit (HOSPITAL_COMMUNITY)
Admission: RE | Admit: 2019-06-10 | Discharge: 2019-06-10 | Disposition: A | Discharge status: Home / Self Care | Payer: Medicare Other | Source: Ambulatory Visit | Attending: Rheumatology | Admitting: Rheumatology

## 2019-06-10 DIAGNOSIS — M0579 Rheumatoid arthritis with rheumatoid factor of multiple sites without organ or systems involvement: Secondary | ICD-10-CM | POA: Diagnosis not present

## 2019-06-10 MED ORDER — DIPHENHYDRAMINE HCL 25 MG PO CAPS: 25.0000 mg | ORAL_CAPSULE | ORAL | Status: DC

## 2019-06-10 MED ORDER — ACETAMINOPHEN 325 MG PO TABS: 650.0000 mg | ORAL_TABLET | ORAL | Status: DC

## 2019-06-10 MED ORDER — TOCILIZUMAB 400 MG/20ML IV SOLN
4.0000 mg/kg | INTRAVENOUS | Status: DC
  Administered 2019-06-10: 472 mg via INTRAVENOUS
  Filled 2019-06-10: qty 20

## 2019-06-11 DIAGNOSIS — N959 Unspecified menopausal and perimenopausal disorder: Secondary | ICD-10-CM | POA: Diagnosis not present

## 2019-06-11 DIAGNOSIS — M47816 Spondylosis without myelopathy or radiculopathy, lumbar region: Secondary | ICD-10-CM | POA: Diagnosis not present

## 2019-06-11 DIAGNOSIS — Z13820 Encounter for screening for osteoporosis: Secondary | ICD-10-CM | POA: Diagnosis not present

## 2019-06-11 DIAGNOSIS — R928 Other abnormal and inconclusive findings on diagnostic imaging of breast: Secondary | ICD-10-CM | POA: Diagnosis not present

## 2019-06-11 DIAGNOSIS — Z1231 Encounter for screening mammogram for malignant neoplasm of breast: Secondary | ICD-10-CM | POA: Diagnosis not present

## 2019-06-14 ENCOUNTER — Telehealth: Payer: Self-pay | Admitting: Rheumatology

## 2019-06-14 NOTE — Telephone Encounter (Signed)
Patient called stating she had her 2nd Actemra infusion last week and wants to make sure it is safe for her to take Aleve and/or Ibuprofen.  Patient requested a return call.

## 2019-06-15 NOTE — Telephone Encounter (Signed)
Returned patient call.  Advised that she can take Aleve and/or ibuprofen as needed.  Patient states she read package insert and it states to not take any NSAIDs as it can increase the risk of GI ulceration.  Advised patient that is more of a risk with long-term chronic use not using it on an as-needed basis.  Patient verbalized understanding.  Advised that she should take Aleve and/or ibuprofen as it can cause GI ulceration on its own to minimize risk.  Patient verbalized understanding.  All questions encouraged and answered.  Instructed patient to call with any further questions or concerns.   Mariella Saa, PharmD, [place], [place] Clinical Specialty Pharmacist [phone]  06/15/2019 9:39 AM

## 2019-06-25 DIAGNOSIS — N6489 Other specified disorders of breast: Secondary | ICD-10-CM | POA: Diagnosis not present

## 2019-06-25 DIAGNOSIS — R928 Other abnormal and inconclusive findings on diagnostic imaging of breast: Secondary | ICD-10-CM | POA: Diagnosis not present

## 2019-06-30 NOTE — Progress Notes (Signed)
Office Visit Note  Patient: [name] [name]             Date of Birth: 1950/07/24           MRN: [id number]             PCP: Lowella Dandy, NP Referring: Lowella Dandy, NP Visit Date: 07/07/2019 Occupation: '@GUAROCC'$ @  Subjective:  Medication monitoring   History of Present Illness: [name] [name] is a 69 y.o. female with history of seropositive rheumatoid arthritis and osteoarthritis. She restarted on Actemra 400 mg/20 ml IV infusions on 05/13/19.  She was initially started on actemra infusions in 2016 and was under the care of Dr. Dossie Der at that time.  She had a gap in therapy from 03/02/19 until she resuming on 05/13/19.  She continues to notice improvement since resuming Actemra infusions.  She continues to have pain and swelling in the left thumb.  She experiences pain in both knee joints and has difficulty climbing steps and getting up from a seated position.  She is walking with a cane to assist with ambulation.  She continues to have occasional arthralgias but overall her joint pain and inflammation have been improving.    Activities of Daily Living:  Patient reports morning stiffness for less than 5   minutes.   Patient Reports nocturnal pain.  Difficulty dressing/grooming: Denies Difficulty climbing stairs: Reports Difficulty getting out of chair: Denies Difficulty using hands for taps, buttons, cutlery, and/or writing: Denies  Review of Systems  Constitutional: Positive for fatigue.  HENT: Negative for mouth sores, mouth dryness and nose dryness.   Eyes: Positive for dryness. Negative for pain, itching and visual disturbance.  Respiratory: Negative for cough, hemoptysis, shortness of breath and difficulty breathing.   Cardiovascular: Negative for chest pain, palpitations, hypertension and swelling in legs/feet.  Gastrointestinal: Negative for blood in stool, constipation and diarrhea.  Endocrine: Negative for increased urination.  Genitourinary: Negative for difficulty urinating  and painful urination.  Musculoskeletal: Positive for arthralgias, gait problem, joint pain, joint swelling and morning stiffness. Negative for myalgias, muscle weakness, muscle tenderness and myalgias.  Skin: Negative for color change, pallor, rash, hair loss, nodules/bumps, redness, skin tightness, ulcers and sensitivity to sunlight.  Allergic/Immunologic: Negative for susceptible to infections.  Neurological: Negative for dizziness, numbness, headaches, memory loss and weakness.  Hematological: Negative for bruising/bleeding tendency and swollen glands.  Psychiatric/Behavioral: Negative for depressed mood, confusion and sleep disturbance. The patient is not nervous/anxious.     PMFS History:  Patient Active Problem List   Diagnosis Date Noted  . Dyspnea 11/05/2013  . Rheumatoid arthritis ([place])   . Hypertension   . GERD (gastroesophageal reflux disease)   . Morbid obesity ([place]) 03/10/2013  . S/P right THA, AA 03/09/2013    Past Medical History:  Diagnosis Date  . Deviated nasal septum    right side, cannot breathe out of right septum  . GERD (gastroesophageal reflux disease)   . Hypertension   . Plantar fasciitis   . Rheumatoid arthritis ([place])    oa and ra in arms, shane anderson  . UTI (lower urinary tract infection) 03/02/2013   treated with keflex for hip surgery on 03/09/13    Family History  Problem Relation Age of Onset  . Heart disease Father   . Kidney disease Father   . Heart attack Father   . Heart disease Brother   . Cancer Brother        agent orange   . Rheum arthritis Maternal  Aunt   . Hypertension Mother   . Alzheimer's disease Mother   . Heart attack Brother   . Vascular Disease Brother   . Kidney disease Brother    Past Surgical History:  Procedure Laterality Date  . [place] N/A 04/21/2015  . DIAGNOSTIC MAMMOGRAM Bilateral 04/05/2016  . ingrown toenail removed  5-6 yrs ago  . TOTAL HIP ARTHROPLASTY  Right 03/09/2013   Procedure: RIGHT TOTAL HIP ARTHROPLASTY ANTERIOR APPROACH;  Surgeon: Mauri Pole, MD;  Location: [facility];  Service: Orthopedics;  Laterality: Right;   Social History   Social History Narrative  . Not on file   Immunization History  Administered Date(s) Administered  . Influenza Split 12/26/2012  . Influenza-Unspecified 11/29/2013  . Pneumococcal Polysaccharide-23 03/11/2013     Objective: Vital Signs: BP (!) 143/78 (BP Location: Left Wrist, Patient Position: Sitting, Cuff Size: Normal)   Pulse 65   Resp 16   Ht '5\' 1"'$  (1.549 m)   Wt 263 lb (119.3 kg)   BMI 49.69 kg/m    Physical Exam Vitals and nursing note reviewed.  Constitutional:      Appearance: She is well-developed.  HENT:     Head: Normocephalic and atraumatic.  Eyes:     Conjunctiva/sclera: Conjunctivae normal.  Pulmonary:     Effort: Pulmonary effort is normal.  Abdominal:     General: Bowel sounds are normal.     Palpations: Abdomen is soft.  Musculoskeletal:     Cervical back: Normal range of motion.  Lymphadenopathy:     Cervical: No cervical adenopathy.  Skin:    General: Skin is warm and dry.     Capillary Refill: Capillary refill takes less than 2 seconds.  Neurological:     Mental Status: She is alert and oriented to person, place, and time.  Psychiatric:        Behavior: Behavior normal.      Musculoskeletal Exam: C-spine has good range of motion with no discomfort.  Shoulder joints have good range of motion bilaterally.  Mild left elbow joint contracture.  Wrist joints, MCPs, PIPs, DIPs have good range of motion.  She has tenderness and synovitis of the left first PIP joint.  Tenderness of bilateral first MCP joints noted.  She has complete fist formation bilaterally.  She has painful range of motion of both knee joints but no warmth or effusion was noted.  Ankle joints have good range of motion with no discomfort.  She has pedal edema bilaterally.  CDAI Exam: CDAI Score: 4.8   Patient Global: 4 mm; Provider Global: 4 mm Swollen: 1 ; Tender: 3  Joint Exam 07/07/2019      Right  Left  MCP 1   Tender   Tender  IP     Swollen Tender     Investigation: No additional findings.  Imaging: No results found.  Recent Labs: Lab Results  Component Value Date   WBC 10.8 (H) 05/13/2019   HGB 10.7 (L) 05/13/2019   PLT 569 (H) 05/13/2019   NA 139 05/13/2019   K 4.6 05/13/2019   CL 104 05/13/2019   CO2 24 05/13/2019   GLUCOSE 112 (H) 05/13/2019   BUN 20 05/13/2019   CREATININE 1.27 (H) 05/13/2019   BILITOT 0.4 05/13/2019   ALKPHOS 68 05/13/2019   AST 19 05/13/2019   ALT 18 05/13/2019   PROT 6.7 05/13/2019   ALBUMIN 2.8 (L) 05/13/2019   CALCIUM 9.0 05/13/2019  GFRAA 50 (L) 05/13/2019   QFTBGOLDPLUS NEGATIVE 04/07/2019    Speciality Comments: No specialty comments available.  Procedures:  No procedures performed Allergies: Allopurinol, Asmanex (120 metered doses) [mometasone furoate], Guaifenesin & derivatives, Methotrexate derivatives, Methylprednisolone, Monosodium glutamate, Prednisone, Prevnar 13 [pneumococcal 13-val conj vacc], Shingrix [zoster vac recomb adjuvanted], Tetanus toxoids, Baclofen, Ciprofloxacin, Humira [adalimumab], Leflunomide, Neosporin [neomycin-bacitracin zn-polymyx], and Sulfa antibiotics   Assessment / Plan:     Visit Diagnoses: Rheumatoid arthritis with rheumatoid factor of multiple sites without organ or systems involvement (HCC) - Dx 20 years ago, Followed by Dr. Dossie Der. 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 ESR 18, CRP<1: She has tenderness and synovitis of the left first PIP and tenderness of bilateral first MCP joints.  Overall she has been noticing an improvement in her arthralgias since resuming Actemra infusions on 05/13/2019.  She has been on Actemra infusions since 2016 under the care of Dr. Dossie Der.  She had a gap in therapy from 03/02/2019 to 05/13/19 while transferring care.  She uses Aspercreme and Biofreeze topically as needed for  symptomatic relief.  She will continue on Actemra 400 mg/20 ml IV infusions every 4 weeks.  She has an upcoming infusion tomorrow.  She is not a good candidate for combination therapy due to having an allergy to sulfa, Arava, and methotrexate.  She also has an allergy to prednisone.  She will continue on Actemra as monotherapy.  She was advised to notify us if she develops increased joint pain or joint swelling.  She will follow-up in the office in 3 months.  High risk medication use - Actemra 400 mg/20 ml IV q4wk (started in 2016).  Discontinued Humira due to experiencing increased shortness of breath.  D/c MTX and Arava-SOB.  She has a sulfa allergy.  Enbrel-inj site rxn.  Lab work from 05/13/2019 was reviewed with the patient today in the office.  She will be having updated lab work with her infusion tomorrow.  TB gold was negative on 04/07/2019.  Status post total hip replacement, right - Dr. Alvan Dame in January 2015: Doing well.  She has no discomfort at this time.  She walks with a cane to assist with ambulation.  Primary osteoarthritis of both knees: She has chronic pain in both knee joints.  She has difficulty rising from a seated position and climbing steps due to the discomfort.  She has painful range of motion of both knee joints.  No warmth or effusion was noted.  Positive TB test - Tx with INH for 9 months at [facility].  TB gold was negative on 04/07/2019.  Other medical conditions are listed as follows:  History of gastroesophageal reflux (GERD)  History of hyperlipidemia - patient has been treated by her PCP.  Essential hypertension  History of anemia  Former smoker  Family history of rheumatoid arthritis - Maternal aunt  Orders: No orders of the defined types were placed in this encounter.  No orders of the defined types were placed in this encounter.    Follow-Up Instructions: Return in about 3 months (around 10/07/2019) for Rheumatoid arthritis.   Ofilia Neas,  PA-C  Note - This record has been created using Dragon software.  Chart creation errors have been sought, but may not always  have been located. Such creation errors do not reflect on  the standard of medical care.

## 2019-07-01 ENCOUNTER — Other Ambulatory Visit: Payer: Self-pay | Admitting: *Deleted

## 2019-07-01 NOTE — Progress Notes (Signed)
Infusion orders are current for patient CBC CMP Tylenol Benadryl appointments are up to date and follow up appointment  is scheduled TB gold not due yet.  

## 2019-07-05 DIAGNOSIS — D510 Vitamin B12 deficiency anemia due to intrinsic factor deficiency: Secondary | ICD-10-CM | POA: Diagnosis not present

## 2019-07-07 ENCOUNTER — Encounter: Payer: Self-pay | Admitting: Physician Assistant

## 2019-07-07 ENCOUNTER — Ambulatory Visit (INDEPENDENT_AMBULATORY_CARE_PROVIDER_SITE_OTHER): Payer: Medicare Other | Admitting: Physician Assistant

## 2019-07-07 ENCOUNTER — Other Ambulatory Visit: Payer: Self-pay

## 2019-07-07 VITALS — BP 143/78 | HR 65 | Resp 16 | Ht 61.0 in | Wt 263.0 lb

## 2019-07-07 DIAGNOSIS — Z8639 Personal history of other endocrine, nutritional and metabolic disease: Secondary | ICD-10-CM

## 2019-07-07 DIAGNOSIS — Z862 Personal history of diseases of the blood and blood-forming organs and certain disorders involving the immune mechanism: Secondary | ICD-10-CM

## 2019-07-07 DIAGNOSIS — Z8261 Family history of arthritis: Secondary | ICD-10-CM

## 2019-07-07 DIAGNOSIS — Z79899 Other long term (current) drug therapy: Secondary | ICD-10-CM

## 2019-07-07 DIAGNOSIS — Z8719 Personal history of other diseases of the digestive system: Secondary | ICD-10-CM

## 2019-07-07 DIAGNOSIS — M170 Bilateral primary osteoarthritis of knee: Secondary | ICD-10-CM

## 2019-07-07 DIAGNOSIS — I10 Essential (primary) hypertension: Secondary | ICD-10-CM | POA: Diagnosis not present

## 2019-07-07 DIAGNOSIS — R7611 Nonspecific reaction to tuberculin skin test without active tuberculosis: Secondary | ICD-10-CM | POA: Diagnosis not present

## 2019-07-07 DIAGNOSIS — Z87891 Personal history of nicotine dependence: Secondary | ICD-10-CM | POA: Diagnosis not present

## 2019-07-07 DIAGNOSIS — Z96641 Presence of right artificial hip joint: Secondary | ICD-10-CM

## 2019-07-07 DIAGNOSIS — M0579 Rheumatoid arthritis with rheumatoid factor of multiple sites without organ or systems involvement: Secondary | ICD-10-CM | POA: Diagnosis not present

## 2019-07-08 ENCOUNTER — Other Ambulatory Visit: Payer: Self-pay

## 2019-07-08 ENCOUNTER — Ambulatory Visit (HOSPITAL_COMMUNITY)
Admission: RE | Admit: 2019-07-08 | Discharge: 2019-07-08 | Disposition: A | Discharge status: Home / Self Care | Payer: Medicare Other | Source: Ambulatory Visit | Attending: Rheumatology | Admitting: Rheumatology

## 2019-07-08 DIAGNOSIS — M0579 Rheumatoid arthritis with rheumatoid factor of multiple sites without organ or systems involvement: Secondary | ICD-10-CM

## 2019-07-08 LAB — COMPREHENSIVE METABOLIC PANEL
ALT: 20 U/L (ref 0–44)
AST: 21 U/L (ref 15–41)
Albumin: 3.4 g/dL — ABNORMAL LOW (ref 3.5–5.0)
Alkaline Phosphatase: 59 U/L (ref 38–126)
Anion gap: 7 (ref 5–15)
BUN: 26 mg/dL — ABNORMAL HIGH (ref 8–23)
CO2: 25 mmol/L (ref 22–32)
Calcium: 9.1 mg/dL (ref 8.9–10.3)
Chloride: 108 mmol/L (ref 98–111)
Creatinine, Ser: 1.24 mg/dL — ABNORMAL HIGH (ref 0.44–1.00)
GFR calc Af Amer: 51 mL/min — ABNORMAL LOW (ref >60)
GFR calc non Af Amer: 44 mL/min — ABNORMAL LOW (ref >60)
Glucose, Bld: 100 mg/dL — ABNORMAL HIGH (ref 70–99)
Potassium: 4.4 mmol/L (ref 3.5–5.1)
Sodium: 140 mmol/L (ref 135–145)
Total Bilirubin: 1.3 mg/dL — ABNORMAL HIGH (ref 0.3–1.2)
Total Protein: 6.4 g/dL — ABNORMAL LOW (ref 6.5–8.1)

## 2019-07-08 LAB — CBC
HCT: 38.3 % (ref 36.0–46.0)
Hemoglobin: 12.4 g/dL (ref 12.0–15.0)
MCH: 32 pg (ref 26.0–34.0)
MCHC: 32.4 g/dL (ref 30.0–36.0)
MCV: 99 fL (ref 80.0–100.0)
Platelets: 280 10*3/uL (ref 150–400)
RBC: 3.87 MIL/uL (ref 3.87–5.11)
RDW: 13.8 % (ref 11.5–15.5)
WBC: 9.3 10*3/uL (ref 4.0–10.5)
nRBC: 0 % (ref 0.0–0.2)

## 2019-07-08 MED ORDER — DIPHENHYDRAMINE HCL 25 MG PO CAPS: 25.0000 mg | ORAL_CAPSULE | ORAL | Status: DC

## 2019-07-08 MED ORDER — ACETAMINOPHEN 325 MG PO TABS: 650.0000 mg | ORAL_TABLET | ORAL | Status: DC

## 2019-07-08 MED ORDER — TOCILIZUMAB 400 MG/20ML IV SOLN
4.0000 mg/kg | INTRAVENOUS | Status: DC
  Administered 2019-07-08: 472 mg via INTRAVENOUS
  Filled 2019-07-08: qty 23.6

## 2019-07-08 NOTE — Progress Notes (Signed)
Labs are stable.  GFR is low.  Please forward labs to her PCP.

## 2019-07-09 ENCOUNTER — Telehealth: Payer: Self-pay | Admitting: Rheumatology

## 2019-07-09 NOTE — Telephone Encounter (Signed)
Patient advised as she is on immunosuppressant medications.  Dr. Estanislado Pandy would recommend use of mask and all precautions as the vaccination does not give 100% immunity. Patient verbalized understanding.

## 2019-07-09 NOTE — Telephone Encounter (Signed)
Patient is on immunosuppressant medications.  I would recommend use of mask and all precautions as the vaccination does not give 100% immunity.

## 2019-07-09 NOTE — Telephone Encounter (Signed)
Patient calling because CDC is now saying people who have had both vaccines do not need to wear a mask anymore. Due to the medication she takes, is this okay? Does she need to continue with a mask, and where?  Patient had blood work drawn yesterday, and wants results sent to PCP once we get them. PCP, Amy Moon PAC.

## 2019-07-13 ENCOUNTER — Telehealth: Payer: Self-pay | Admitting: Rheumatology

## 2019-07-13 NOTE — Telephone Encounter (Signed)
Patient called stating she heard on the news that a booster might be necessary with people on immunosuprressant medications.  Patient is requesting a return call with her Dr. Arlean [name] recommendations.

## 2019-07-13 NOTE — Telephone Encounter (Signed)
Advised patient We do not have any recommendations at this point.  CDC will be sending recommendations in the future.  She may also contact her PCP. Patient verbalized understanding.

## 2019-07-13 NOTE — Telephone Encounter (Signed)
Please advise on booster vaccine. Thanks!

## 2019-07-13 NOTE — Telephone Encounter (Signed)
We do not have any recommendations at this point.  CDC will be sending recommendations in the future.  She may also contact her PCP.

## 2019-07-23 DIAGNOSIS — Z20822 Contact with and (suspected) exposure to covid-19: Secondary | ICD-10-CM | POA: Diagnosis not present

## 2019-07-23 DIAGNOSIS — L659 Nonscarring hair loss, unspecified: Secondary | ICD-10-CM | POA: Diagnosis not present

## 2019-07-23 DIAGNOSIS — I10 Essential (primary) hypertension: Secondary | ICD-10-CM | POA: Diagnosis not present

## 2019-07-23 DIAGNOSIS — R739 Hyperglycemia, unspecified: Secondary | ICD-10-CM | POA: Diagnosis not present

## 2019-07-23 DIAGNOSIS — R252 Cramp and spasm: Secondary | ICD-10-CM | POA: Diagnosis not present

## 2019-07-23 DIAGNOSIS — Z79899 Other long term (current) drug therapy: Secondary | ICD-10-CM | POA: Diagnosis not present

## 2019-07-23 DIAGNOSIS — E785 Hyperlipidemia, unspecified: Secondary | ICD-10-CM | POA: Diagnosis not present

## 2019-08-04 ENCOUNTER — Other Ambulatory Visit: Payer: Self-pay | Admitting: *Deleted

## 2019-08-04 DIAGNOSIS — M0579 Rheumatoid arthritis with rheumatoid factor of multiple sites without organ or systems involvement: Secondary | ICD-10-CM

## 2019-08-05 ENCOUNTER — Other Ambulatory Visit: Payer: Self-pay

## 2019-08-05 ENCOUNTER — Ambulatory Visit (HOSPITAL_COMMUNITY)
Admission: RE | Admit: 2019-08-05 | Discharge: 2019-08-05 | Disposition: A | Discharge status: Home / Self Care | Payer: Medicare Other | Source: Ambulatory Visit | Attending: Rheumatology | Admitting: Rheumatology

## 2019-08-05 DIAGNOSIS — M0579 Rheumatoid arthritis with rheumatoid factor of multiple sites without organ or systems involvement: Secondary | ICD-10-CM | POA: Insufficient documentation

## 2019-08-05 MED ORDER — DIPHENHYDRAMINE HCL 25 MG PO CAPS: 25.0000 mg | ORAL_CAPSULE | ORAL | Status: DC

## 2019-08-05 MED ORDER — DIPHENHYDRAMINE HCL 25 MG PO CAPS
ORAL_CAPSULE | ORAL | Status: AC
  Administered 2019-08-05: 25 mg
  Filled 2019-08-05: qty 1

## 2019-08-05 MED ORDER — TOCILIZUMAB 400 MG/20ML IV SOLN
4.0000 mg/kg | INTRAVENOUS | Status: DC
  Administered 2019-08-05: 472 mg via INTRAVENOUS
  Filled 2019-08-05: qty 23.6

## 2019-08-05 MED ORDER — ACETAMINOPHEN 325 MG PO TABS: 650.0000 mg | ORAL_TABLET | ORAL | Status: DC

## 2019-08-06 DIAGNOSIS — D510 Vitamin B12 deficiency anemia due to intrinsic factor deficiency: Secondary | ICD-10-CM | POA: Diagnosis not present

## 2019-08-11 ENCOUNTER — Ambulatory Visit (INDEPENDENT_AMBULATORY_CARE_PROVIDER_SITE_OTHER): Payer: Medicare Other | Admitting: Allergy and Immunology

## 2019-08-11 ENCOUNTER — Other Ambulatory Visit: Payer: Self-pay

## 2019-08-11 ENCOUNTER — Encounter: Payer: Self-pay | Admitting: Allergy and Immunology

## 2019-08-11 VITALS — BP 116/60 | HR 70 | Resp 18

## 2019-08-11 DIAGNOSIS — J3089 Other allergic rhinitis: Secondary | ICD-10-CM | POA: Diagnosis not present

## 2019-08-11 DIAGNOSIS — J4540 Moderate persistent asthma, uncomplicated: Secondary | ICD-10-CM | POA: Diagnosis not present

## 2019-08-11 DIAGNOSIS — D849 Immunodeficiency, unspecified: Secondary | ICD-10-CM | POA: Diagnosis not present

## 2019-08-11 MED ORDER — FLUTICASONE PROPIONATE 50 MCG/ACT NA SUSP: NASAL | 1 refills | Status: DC

## 2019-08-11 MED ORDER — ARNUITY ELLIPTA 200 MCG/ACT IN AEPB: INHALATION_SPRAY | RESPIRATORY_TRACT | 1 refills | Status: DC

## 2019-08-11 MED ORDER — ALBUTEROL SULFATE HFA 108 (90 BASE) MCG/ACT IN AERS: INHALATION_SPRAY | RESPIRATORY_TRACT | 1 refills | Status: DC

## 2019-08-11 NOTE — Progress Notes (Signed)
[facility]   Follow-up Note  Referring Provider: Lowella Dandy, NP Primary Provider: Lowella Dandy, NP Date of Office Visit: 08/11/2019  Subjective:   [name] [name] (DOB: 09/04/1950) is a 69 y.o. female who returns to the [facility] on 08/11/2019 in re-evaluation of the following:  HPI: [name] [name] returns to this clinic in evaluation of asthma and allergic rhinitis.  Her last visit to this clinic was 10 August 2018.  She has really done well with her airway issue and has not had any significant problems involving either her upper or lower airway.  She has not required a systemic steroid or an antibiotic to treat any type of airway issue.  Rarely does she use a short acting bronchodilator.  She does not really exercise because of multiples musculoskeletal issues tied up with her rheumatoid arthritis.  She can smell and taste with no problem at all.  She continues on immunosuppression for rheumatoid arthritis utilizing Actemra.  She has obtained 2 Moderna Covid vaccinations.  Allergies as of 08/11/2019      Reactions   Allopurinol Shortness Of Breath   Asmanex (120 Metered Doses) [mometasone Furoate] Shortness Of Breath   Guaifenesin & Derivatives Shortness Of Breath   MUCINEX   Methotrexate Derivatives Shortness Of Breath   Methylprednisolone Shortness Of Breath   Monosodium Glutamate Shortness Of Breath   Prednisone Shortness Of Breath   Prevnar 13 [pneumococcal 13-val Conj Vacc] Shortness Of Breath   Shingrix [zoster Vac Recomb Adjuvanted] Shortness Of Breath   Tetanus Toxoids Shortness Of Breath   Baclofen    Confusion, muscle weakness, forgetfulness, hallucinations, loss of appetite, headaches    Ciprofloxacin Other (See Comments)   Breathing and swallowing after two doses of medications   Humira [adalimumab]    SOB   Leflunomide    SOB   Neosporin [neomycin-bacitracin Zn-polymyx] Rash   Sulfa Antibiotics Itching,  Rash      Medication List      acetaminophen 500 MG tablet Commonly known as: TYLENOL Take 500 mg by mouth every 6 (six) hours as needed.   ACTEMRA IV Inject into the vein every 28 (twenty-eight) days.   albuterol 108 (90 Base) MCG/ACT inhaler Commonly known as: VENTOLIN HFA Can inhale two puffs every four to six hours as needed for cough or wheeze.   Arnuity Ellipta 200 MCG/ACT Aepb Generic drug: Fluticasone Furoate Inhale 1 puff into the lungs daily.   ASPERCREME LIDOCAINE EX Apply topically.   atenolol 50 MG tablet Commonly known as: TENORMIN Take 50 mg by mouth every evening.   B-12 COMPLIANCE INJECTION IJ Inject 1 Dose as directed. 1 Injection 5 times a month   BIOFREEZE EX Apply topically.   BIOTIN PO Take 1,000 mg by mouth daily.   cephALEXin 500 MG capsule Commonly known as: KEFLEX Take 500 mg by mouth 4 (four) times daily. 1 hour prior to procedure   diphenhydrAMINE 25 MG tablet Commonly known as: BENADRYL Take 25 mg by mouth every 6 (six) hours as needed for itching or allergies.   fluticasone 50 MCG/ACT nasal spray Commonly known as: Flonase Place 1 spray into both nostrils daily.   GINGER PO Take 550 mg by mouth every morning.   Iron-Vitamin C 65-125 MG Tabs Take by mouth every morning.   Lecithin 1200 MG Caps Take 1,200 mg by mouth every morning.   lisinopril-hydrochlorothiazide 20-12.5 MG tablet Commonly known as: ZESTORETIC Take 1 tablet by mouth  every morning.   multivitamin with minerals Tabs tablet Take 1 tablet by mouth daily.   naproxen sodium 220 MG tablet Commonly known as: ALEVE Take 220 mg by mouth as needed.   OVER THE COUNTER MEDICATION 3 (three) times daily as needed. Lubricating eye drops   Red Yeast Rice 600 MG Caps Take 600 mg by mouth 2 (two) times daily.   Turmeric Curcumin 500 MG Caps Take 1,000 mg by mouth 2 (two) times daily.   Vascepa 1 g capsule Generic drug: icosapent Ethyl 2 (TWO) CAPSULE TWO TIMES  DAILY       Past Medical History:  Diagnosis Date  . Deviated nasal septum    right side, cannot breathe out of right septum  . GERD (gastroesophageal reflux disease)   . Hypertension   . Plantar fasciitis   . Rheumatoid arthritis ([place])    oa and ra in arms, shane anderson  . UTI (lower urinary tract infection) 03/02/2013   treated with keflex for hip surgery on 03/09/13    Past Surgical History:  Procedure Laterality Date  . [place] N/A 04/21/2015  . DIAGNOSTIC MAMMOGRAM Bilateral 04/05/2016  . ingrown toenail removed  5-6 yrs ago  . TOTAL HIP ARTHROPLASTY Right 03/09/2013   Procedure: RIGHT TOTAL HIP ARTHROPLASTY ANTERIOR APPROACH;  Surgeon: Mauri Pole, MD;  Location: [facility];  Service: Orthopedics;  Laterality: Right;    Review of systems negative except as noted in HPI / PMHx or noted below:  Review of Systems  Constitutional: Negative.   HENT: Negative.   Eyes: Negative.   Respiratory: Negative.   Cardiovascular: Negative.   Gastrointestinal: Negative.   Genitourinary: Negative.   Musculoskeletal: Negative.   Skin: Negative.   Neurological: Negative.   Endo/Heme/Allergies: Negative.   Psychiatric/Behavioral: Negative.      Objective:   Vitals:   08/11/19 1110  BP: 116/60  Pulse: 70  Resp: 18  SpO2: 97%          Physical Exam Constitutional:      Appearance: She is not diaphoretic.  HENT:     Head: Normocephalic.     Right Ear: Tympanic membrane, ear canal and external ear normal.     Left Ear: Tympanic membrane, ear canal and external ear normal.     Nose: Nose normal. No mucosal edema or rhinorrhea.     Mouth/Throat:     Pharynx: Uvula midline. No oropharyngeal exudate.  Eyes:     Conjunctiva/sclera: Conjunctivae normal.  Neck:     Thyroid: No thyromegaly.     Trachea: Trachea normal. No tracheal tenderness or tracheal deviation.  Cardiovascular:     Rate and Rhythm: Normal rate and regular  rhythm.     Heart sounds: Normal heart sounds, S1 normal and S2 normal. No murmur heard.   Pulmonary:     Effort: No respiratory distress.     Breath sounds: Normal breath sounds. No stridor. No wheezing or rales.  Lymphadenopathy:     Head:     Right side of head: No tonsillar adenopathy.     Left side of head: No tonsillar adenopathy.     Cervical: No cervical adenopathy.  Skin:    Findings: No erythema or rash.     Nails: There is no clubbing.  Neurological:     Mental Status: She is alert.     Diagnostics:    Spirometry was performed and demonstrated an FEV1 of 1.61 at 77 %  of predicted.  Assessment and Plan:   1. Asthma, moderate persistent, well-controlled   2. Perennial allergic rhinitis   3. Immunosuppression ([place])      1.  Continue to perform Allergen avoidance measures  2.  Continue to Treat and prevent inflammation:   A.  Flonase 1 spray each nostril 3-7 times per week  B.  Arnuity 200 1 inhalation 3-7 times per week  3.  If needed:   A.  Pro-air HFA 1-2 puffs every 4-6 hours if needed  B.  OTC antihistamine  4.  Return to clinic in 12 months or earlier if problem   [name] [name] appears to be doing very well on her current therapy directed against respiratory tract inflammation and she will continue on relatively low-dose nasal steroid and inhaled steroid and I will see her back in his clinic in 1 year or earlier if there is a problem.  Allena Katz, MD Allergy / Immunology [place]

## 2019-08-11 NOTE — Patient Instructions (Addendum)
°  1.  Continue to perform Allergen avoidance measures  2.  Continue to Treat and prevent inflammation:   A.  Flonase 1 spray each nostril 3-7 times per week  B.  Arnuity 200 1 inhalation 3-7 times per week  3.  If needed:   A.  Pro-air HFA 1-2 puffs every 4-6 hours if needed  B.  OTC antihistamine  4.  Return to clinic in 12 months or earlier if problem

## 2019-08-12 ENCOUNTER — Encounter: Payer: Self-pay | Admitting: Allergy and Immunology

## 2019-09-02 ENCOUNTER — Telehealth: Payer: Self-pay | Admitting: Rheumatology

## 2019-09-02 ENCOUNTER — Other Ambulatory Visit: Payer: Self-pay

## 2019-09-02 ENCOUNTER — Ambulatory Visit (HOSPITAL_COMMUNITY)
Admission: RE | Admit: 2019-09-02 | Discharge: 2019-09-02 | Disposition: A | Discharge status: Home / Self Care | Payer: Medicare Other | Source: Ambulatory Visit | Attending: Rheumatology | Admitting: Rheumatology

## 2019-09-02 DIAGNOSIS — M0579 Rheumatoid arthritis with rheumatoid factor of multiple sites without organ or systems involvement: Secondary | ICD-10-CM | POA: Diagnosis not present

## 2019-09-02 LAB — COMPREHENSIVE METABOLIC PANEL
ALT: 16 U/L (ref 0–44)
AST: 22 U/L (ref 15–41)
Albumin: 3.5 g/dL (ref 3.5–5.0)
Alkaline Phosphatase: 50 U/L (ref 38–126)
Anion gap: 8 (ref 5–15)
BUN: 26 mg/dL — ABNORMAL HIGH (ref 8–23)
CO2: 24 mmol/L (ref 22–32)
Calcium: 8.9 mg/dL (ref 8.9–10.3)
Chloride: 106 mmol/L (ref 98–111)
Creatinine, Ser: 1.34 mg/dL — ABNORMAL HIGH (ref 0.44–1.00)
GFR calc Af Amer: 47 mL/min — ABNORMAL LOW (ref >60)
GFR calc non Af Amer: 40 mL/min — ABNORMAL LOW (ref >60)
Glucose, Bld: 97 mg/dL (ref 70–99)
Potassium: 4.7 mmol/L (ref 3.5–5.1)
Sodium: 138 mmol/L (ref 135–145)
Total Bilirubin: 1.4 mg/dL — ABNORMAL HIGH (ref 0.3–1.2)
Total Protein: 6.2 g/dL — ABNORMAL LOW (ref 6.5–8.1)

## 2019-09-02 LAB — CBC WITH DIFFERENTIAL/PLATELET
Abs Immature Granulocytes: 0.03 K/uL (ref 0.00–0.07)
Basophils Absolute: 0 K/uL (ref 0.0–0.1)
Basophils Relative: 0 %
Eosinophils Absolute: 0.2 K/uL (ref 0.0–0.5)
Eosinophils Relative: 2 %
HCT: 38.9 % (ref 36.0–46.0)
Hemoglobin: 12.7 g/dL (ref 12.0–15.0)
Immature Granulocytes: 0 %
Lymphocytes Relative: 17 %
Lymphs Abs: 1.7 K/uL (ref 0.7–4.0)
MCH: 32 pg (ref 26.0–34.0)
MCHC: 32.6 g/dL (ref 30.0–36.0)
MCV: 98 fL (ref 80.0–100.0)
Monocytes Absolute: 0.6 K/uL (ref 0.1–1.0)
Monocytes Relative: 6 %
Neutro Abs: 7.4 K/uL (ref 1.7–7.7)
Neutrophils Relative %: 75 %
Platelets: 251 K/uL (ref 150–400)
RBC: 3.97 MIL/uL (ref 3.87–5.11)
RDW: 13.1 % (ref 11.5–15.5)
WBC: 10 K/uL (ref 4.0–10.5)
nRBC: 0 % (ref 0.0–0.2)

## 2019-09-02 MED ORDER — DIPHENHYDRAMINE HCL 25 MG PO CAPS: 25.0000 mg | ORAL_CAPSULE | ORAL | Status: DC

## 2019-09-02 MED ORDER — ACETAMINOPHEN 325 MG PO TABS: 650.0000 mg | ORAL_TABLET | ORAL | Status: DC

## 2019-09-02 MED ORDER — TOCILIZUMAB 400 MG/20ML IV SOLN
4.0000 mg/kg | INTRAVENOUS | Status: DC
  Administered 2019-09-02: 472 mg via INTRAVENOUS
  Filled 2019-09-02: qty 23.6

## 2019-09-02 NOTE — Telephone Encounter (Signed)
Patient called requesting her labwork results be sent to her PCP Dr. Carolanne Grumbling office in [place].  Attn: [name]

## 2019-09-02 NOTE — Telephone Encounter (Signed)
Labs faxed to PCP

## 2019-09-15 NOTE — Progress Notes (Signed)
Office Visit Note  Patient: [name] [name]             Date of Birth: December 23, 1950           MRN: [id number]             PCP: Lowella Dandy, NP Referring: Lowella Dandy, NP Visit Date: 09/29/2019 Occupation: '@GUAROCC'$ @  Subjective:  Rheumatoid nodules   History of Present Illness: [name] [name] is a 69 y.o. female with history of seropositive rheumatoid arthritis and osteoarthritis.  Patient is receiving Actemra IV infusions every 4 weeks. She denies any recent rheumatoid arthritis flares.  She experiences occasional discomfort and stiffness in both hands.  She denies any joint swelling.  She states the rheumatoid nodule on her right arm seems to be increasing in size.  She continues to use a cane to assist with ambulation.  She has received both covid-19 vaccines.  She has not had any recent infections.    Activities of Daily Living:  Patient reports morning stiffness for a few  minutes.   Patient Denies nocturnal pain.  Difficulty dressing/grooming: Denies Difficulty climbing stairs: Reports Difficulty getting out of chair: Denies Difficulty using hands for taps, buttons, cutlery, and/or writing: Reports  Review of Systems  Constitutional: Negative for fatigue.  HENT: Negative for mouth sores, mouth dryness and nose dryness.   Eyes: Positive for dryness.  Respiratory: Negative for shortness of breath and difficulty breathing.   Cardiovascular: Positive for swelling in legs/feet. Negative for chest pain and palpitations.  Gastrointestinal: Negative for blood in stool, constipation and diarrhea.  Endocrine: Negative for increased urination.  Genitourinary: Negative for difficulty urinating.  Musculoskeletal: Positive for arthralgias and joint pain. Negative for joint swelling, myalgias, morning stiffness, muscle tenderness and myalgias.  Skin: Positive for hair loss and nodules/bumps. Negative for color change, rash and redness.  Allergic/Immunologic: Negative for susceptible to  infections.  Neurological: Negative for dizziness, numbness, headaches, memory loss and weakness.  Hematological: Negative for bruising/bleeding tendency.  Psychiatric/Behavioral: Negative for confusion.    PMFS History:  Patient Active Problem List   Diagnosis Date Noted  . Dyspnea 11/05/2013  . Rheumatoid arthritis ([place])   . Hypertension   . GERD (gastroesophageal reflux disease)   . Morbid obesity ([place]) 03/10/2013  . S/P right THA, AA 03/09/2013    Past Medical History:  Diagnosis Date  . Deviated nasal septum    right side, cannot breathe out of right septum  . GERD (gastroesophageal reflux disease)   . Hypertension   . Plantar fasciitis   . Rheumatoid arthritis ([place])    oa and ra in arms, [facility]  . UTI (lower urinary tract infection) 03/02/2013   treated with keflex for hip surgery on 03/09/13    Family History  Problem Relation Age of Onset  . Heart disease Father   . Kidney disease Father   . Heart attack Father   . Heart disease Brother   . Cancer Brother        agent orange   . Rheum arthritis Maternal Aunt   . Hypertension Mother   . Alzheimer's disease Mother   . Heart attack Brother   . Vascular Disease Brother   . Kidney disease Brother    Past Surgical History:  Procedure Laterality Date  . [place] N/A 04/21/2015  . DIAGNOSTIC MAMMOGRAM Bilateral 04/05/2016  . ingrown toenail removed  5-6 yrs ago  . TOTAL HIP ARTHROPLASTY Right  03/09/2013   Procedure: RIGHT TOTAL HIP ARTHROPLASTY ANTERIOR APPROACH;  Surgeon: Mauri Pole, MD;  Location: [facility];  Service: Orthopedics;  Laterality: Right;   Social History   Social History Narrative  . Not on file   Immunization History  Administered Date(s) Administered  . Influenza Split 12/26/2012  . Influenza-Unspecified 11/29/2013  . Pneumococcal Polysaccharide-23 03/11/2013     Objective: Vital Signs: BP 123/73 (BP Location: Left Wrist, Patient  Position: Sitting, Cuff Size: Normal)   Pulse 61   Resp 17   Ht '5\' 1"'$  (1.549 m)   Wt 264 lb 3.2 oz (119.8 kg)   BMI 49.92 kg/m    Physical Exam Vitals and nursing note reviewed.  Constitutional:      Appearance: She is well-developed.  HENT:     Head: Normocephalic and atraumatic.  Eyes:     Conjunctiva/sclera: Conjunctivae normal.  Pulmonary:     Effort: Pulmonary effort is normal.  Abdominal:     General: Bowel sounds are normal.     Palpations: Abdomen is soft.  Musculoskeletal:     Cervical back: Normal range of motion.  Lymphadenopathy:     Cervical: No cervical adenopathy.  Skin:    General: Skin is warm and dry.     Capillary Refill: Capillary refill takes less than 2 seconds.  Neurological:     Mental Status: She is alert and oriented to person, place, and time.  Psychiatric:        Behavior: Behavior normal.      Musculoskeletal Exam: C-spine good ROM.  No midline spinal tenderness. Shoulder joints good ROM with no discomfort.  Elbow joint contracture bilaterally.  Wrist joints, MCPs, PIPs, and DIPs good ROM with no synovitis.  PIP and DIP thickening consistent with osteoarthritis of both hands.  Knee joints have good ROM with no warmth or effusion.  Pedal edema noted bilaterally.    CDAI Exam: CDAI Score: -- Patient Global: --; Provider Global: -- Swollen: --; Tender: -- Joint Exam 09/29/2019   No joint exam has been documented for this visit   There is currently no information documented on the homunculus. Go to the Rheumatology activity and complete the homunculus joint exam.  Investigation: No additional findings.  Imaging: No results found.  Recent Labs: Lab Results  Component Value Date   WBC 10.0 09/02/2019   HGB 12.7 09/02/2019   PLT 251 09/02/2019   NA 138 09/02/2019   K 4.7 09/02/2019   CL 106 09/02/2019   CO2 24 09/02/2019   GLUCOSE 97 09/02/2019   BUN 26 (H) 09/02/2019   CREATININE 1.34 (H) 09/02/2019   BILITOT 1.4 (H) 09/02/2019    ALKPHOS 50 09/02/2019   AST 22 09/02/2019   ALT 16 09/02/2019   PROT 6.2 (L) 09/02/2019   ALBUMIN 3.5 09/02/2019   CALCIUM 8.9 09/02/2019   GFRAA 47 (L) 09/02/2019   QFTBGOLDPLUS NEGATIVE 04/07/2019    Speciality Comments: No specialty comments available.  Procedures:  No procedures performed Allergies: Allopurinol, Asmanex (120 metered doses) [mometasone furoate], Guaifenesin & derivatives, Methotrexate derivatives, Methylprednisolone, Monosodium glutamate, Prednisone, Prevnar 13 [pneumococcal 13-val conj vacc], Shingrix [zoster vac recomb adjuvanted], Tetanus toxoids, Baclofen, Ciprofloxacin, Humira [adalimumab], Leflunomide, Neosporin [neomycin-bacitracin zn-polymyx], and Sulfa antibiotics   Assessment / Plan:     Visit Diagnoses: Rheumatoid arthritis with rheumatoid factor of multiple sites without organ or systems involvement (HCC) -  Dx 20 years ago, Followed by Dr. Dossie Der. 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 ESR 18, CRP<1: She has no tenderness or synovitis  on exam.  She has not had any recent rheumatoid arthritis flares.  She is clinically doing well on Actemra 400 mg/20 ml IV infusions every 4 weeks.  She has chronic bilateral elbow joint flexion contractures but no tenderness or inflammation was noted.  She has a palpable nontender rheumatoid nodule on the lateral aspect of the right upper extremity.  She is not having any other joint pain or inflammation at this time.  She will continue on the current treatment regimen.  She was advised to notify us if she develops increased joint pain or joint swelling.  She will follow-up in the office in 5 months.   High risk medication use - Actemra 400 mg/20 ml IV q4wk (started in 2016).  Discontinued Humira due to experiencing increased shortness of breath.  D/c MTX and Arava-SOB.  She has a sulfa allergy.  CBC and CMP were drawn on 09/02/2019.  She has lab work drawn with her infusions.  TB gold was negative on 04/07/2019 and we will continue to monitor  yearly.  We discussed the importance of postponing Actemra infusions if she develops signs or symptoms of an infection and to resume once the infection has completely cleared.  She has received both COVID-19 vaccinations.  She has not had any recent infections.  We discussed the importance of wearing a mask and social distancing due to the unknown magnitude and duration of immunity with the vaccine since she is on an immunosuppressant agent.  She was advised to notify us if she develops COVID-19 infection because she will require the COVID-19 antibody infusion.  She voiced understanding.  Status post total hip replacement, right: Doing well.  She does not have any discomfort at this time.  She continues to use a cane to assist with ambulation.  Primary osteoarthritis of both knees: She has good range of motion of both knee joints on exam.  No warmth or effusion was noted.  She uses a cane to assist with ambulation.  Positive TB test: TB gold negative on 04/07/2019.  Other medical conditions are listed as follows:  History of gastroesophageal reflux (GERD)  History of hyperlipidemia  Essential hypertension  History of anemia  Former smoker  Family history of rheumatoid arthritis  Orders: No orders of the defined types were placed in this encounter.  No orders of the defined types were placed in this encounter.   Follow-Up Instructions: Return in about 5 months (around 02/29/2020) for Rheumatoid arthritis, Osteoarthritis.   Ofilia Neas, PA-C  Note - This record has been created using Dragon software.  Chart creation errors have been sought, but may not always  have been located. Such creation errors do not reflect on  the standard of medical care.

## 2019-09-29 ENCOUNTER — Ambulatory Visit (INDEPENDENT_AMBULATORY_CARE_PROVIDER_SITE_OTHER): Payer: Medicare Other | Admitting: Physician Assistant

## 2019-09-29 ENCOUNTER — Other Ambulatory Visit: Payer: Self-pay

## 2019-09-29 ENCOUNTER — Encounter: Payer: Self-pay | Admitting: Physician Assistant

## 2019-09-29 VITALS — BP 123/73 | HR 61 | Resp 17 | Ht 61.0 in | Wt 264.2 lb

## 2019-09-29 DIAGNOSIS — Z8719 Personal history of other diseases of the digestive system: Secondary | ICD-10-CM

## 2019-09-29 DIAGNOSIS — Z8639 Personal history of other endocrine, nutritional and metabolic disease: Secondary | ICD-10-CM | POA: Diagnosis not present

## 2019-09-29 DIAGNOSIS — I10 Essential (primary) hypertension: Secondary | ICD-10-CM

## 2019-09-29 DIAGNOSIS — Z79899 Other long term (current) drug therapy: Secondary | ICD-10-CM | POA: Diagnosis not present

## 2019-09-29 DIAGNOSIS — Z96641 Presence of right artificial hip joint: Secondary | ICD-10-CM

## 2019-09-29 DIAGNOSIS — R7611 Nonspecific reaction to tuberculin skin test without active tuberculosis: Secondary | ICD-10-CM

## 2019-09-29 DIAGNOSIS — Z87891 Personal history of nicotine dependence: Secondary | ICD-10-CM

## 2019-09-29 DIAGNOSIS — Z8261 Family history of arthritis: Secondary | ICD-10-CM | POA: Diagnosis not present

## 2019-09-29 DIAGNOSIS — Z862 Personal history of diseases of the blood and blood-forming organs and certain disorders involving the immune mechanism: Secondary | ICD-10-CM | POA: Diagnosis not present

## 2019-09-29 DIAGNOSIS — M0579 Rheumatoid arthritis with rheumatoid factor of multiple sites without organ or systems involvement: Secondary | ICD-10-CM

## 2019-09-29 DIAGNOSIS — M170 Bilateral primary osteoarthritis of knee: Secondary | ICD-10-CM | POA: Diagnosis not present

## 2019-09-30 ENCOUNTER — Other Ambulatory Visit: Payer: Self-pay

## 2019-09-30 ENCOUNTER — Ambulatory Visit (HOSPITAL_COMMUNITY)
Admission: RE | Admit: 2019-09-30 | Discharge: 2019-09-30 | Disposition: A | Discharge status: Home / Self Care | Payer: Medicare Other | Source: Ambulatory Visit | Attending: Rheumatology | Admitting: Rheumatology

## 2019-09-30 DIAGNOSIS — M0579 Rheumatoid arthritis with rheumatoid factor of multiple sites without organ or systems involvement: Secondary | ICD-10-CM | POA: Insufficient documentation

## 2019-09-30 MED ORDER — DIPHENHYDRAMINE HCL 25 MG PO CAPS: 25.0000 mg | ORAL_CAPSULE | ORAL | Status: DC

## 2019-09-30 MED ORDER — TOCILIZUMAB 400 MG/20ML IV SOLN
4.0000 mg/kg | INTRAVENOUS | Status: AC
  Administered 2019-09-30: 472 mg via INTRAVENOUS
  Filled 2019-09-30: qty 23.6

## 2019-09-30 MED ORDER — ACETAMINOPHEN 325 MG PO TABS: 650.0000 mg | ORAL_TABLET | ORAL | Status: DC

## 2019-10-05 DIAGNOSIS — D510 Vitamin B12 deficiency anemia due to intrinsic factor deficiency: Secondary | ICD-10-CM | POA: Diagnosis not present

## 2019-10-06 ENCOUNTER — Ambulatory Visit: Payer: Medicare Other | Admitting: Rheumatology

## 2019-10-12 ENCOUNTER — Encounter: Payer: Self-pay | Admitting: *Deleted

## 2019-10-12 ENCOUNTER — Telehealth: Payer: Self-pay | Admitting: Rheumatology

## 2019-10-12 DIAGNOSIS — Z23 Encounter for immunization: Secondary | ICD-10-CM | POA: Diagnosis not present

## 2019-10-12 NOTE — Telephone Encounter (Signed)
Patient called requesting a return call to discuss the booster vaccine.

## 2019-10-12 NOTE — Telephone Encounter (Signed)
Patient advised of following recommendations. Patient advised as she is on Actemra she does not need to alter dosing schedule.   COVID-19 vaccine recommendations:   COVID-19 vaccine is recommended for everyone (unless you are allergic to a vaccine component), even if you are on a medication that suppresses your immune system.   If you are on Methotrexate, Cellcept (mycophenolate), Rinvoq, Morrie Sheldon, and Olumiant- hold the medication for 1 week after each vaccine. Hold Methotrexate for 2 weeks after the single dose COVID-19 vaccine.   If you are on Orencia subcutaneous injection - hold medication one week prior to and one week after the first COVID-19 vaccine dose (only).   If you are on Orencia IV infusions- time vaccination administration so that the first COVID-19 vaccination will occur four weeks after the infusion and postpone the subsequent infusion by one week.   If you are on Cyclophosphamide or Rituxan infusions please contact your doctor prior to receiving the COVID-19 vaccine.   Do not take Tylenol or anti-inflammatory medications (NSAIDs) 24 hours prior to the COVID-19 vaccination.   There is no direct evidence about the efficacy of the COVID-19 vaccine in individuals who are on medications that suppress the immune system.   Even if you are fully vaccinated, and you are on any medications that suppress your immune system, please continue to wear a mask, maintain at least six feet social distance and practice hand hygiene.   If you develop a COVID-19 infection, please contact your PCP or our office to determine if you need antibody infusion.  The booster vaccine is now available for immunocompromised patients. It is advised that if you had Pfizer vaccine you should get [employer] booster.  If you had a Moderna vaccine then you should get a Moderna booster. Johnson and Wynetta Emery does not have a booster vaccine at this time.  Please see the following web sites for updated information.    [email or web address]://[email or web address]/[email or web address]/[email or web address]-Vaccination-Patient-Resources.[email or web address]  [email or web address]://[email or web address]/About-Us/Newsroom/Press-Releases/ID/1159

## 2019-10-28 ENCOUNTER — Encounter (HOSPITAL_COMMUNITY)
Admission: RE | Admit: 2019-10-28 | Discharge: 2019-10-28 | Disposition: A | Discharge status: Home / Self Care | Payer: Medicare Other | Source: Ambulatory Visit | Attending: Rheumatology | Admitting: Rheumatology

## 2019-10-28 ENCOUNTER — Other Ambulatory Visit: Payer: Self-pay

## 2019-10-28 DIAGNOSIS — M0579 Rheumatoid arthritis with rheumatoid factor of multiple sites without organ or systems involvement: Secondary | ICD-10-CM | POA: Diagnosis not present

## 2019-10-28 LAB — CBC WITH DIFFERENTIAL/PLATELET
Abs Immature Granulocytes: 0.04 10*3/uL (ref 0.00–0.07)
Basophils Absolute: 0 10*3/uL (ref 0.0–0.1)
Basophils Relative: 0 %
Eosinophils Absolute: 0.2 10*3/uL (ref 0.0–0.5)
Eosinophils Relative: 3 %
HCT: 37.9 % (ref 36.0–46.0)
Hemoglobin: 12.3 g/dL (ref 12.0–15.0)
Immature Granulocytes: 0 %
Lymphocytes Relative: 19 %
Lymphs Abs: 1.8 10*3/uL (ref 0.7–4.0)
MCH: 32.1 pg (ref 26.0–34.0)
MCHC: 32.5 g/dL (ref 30.0–36.0)
MCV: 99 fL (ref 80.0–100.0)
Monocytes Absolute: 0.6 10*3/uL (ref 0.1–1.0)
Monocytes Relative: 6 %
Neutro Abs: 6.6 10*3/uL (ref 1.7–7.7)
Neutrophils Relative %: 72 %
Platelets: 272 10*3/uL (ref 150–400)
RBC: 3.83 MIL/uL — ABNORMAL LOW (ref 3.87–5.11)
RDW: 13.2 % (ref 11.5–15.5)
WBC: 9.3 10*3/uL (ref 4.0–10.5)
nRBC: 0 % (ref 0.0–0.2)

## 2019-10-28 LAB — COMPREHENSIVE METABOLIC PANEL
ALT: 21 U/L (ref 0–44)
AST: 20 U/L (ref 15–41)
Albumin: 3.7 g/dL (ref 3.5–5.0)
Alkaline Phosphatase: 48 U/L (ref 38–126)
Anion gap: 8 (ref 5–15)
BUN: 27 mg/dL — ABNORMAL HIGH (ref 8–23)
CO2: 25 mmol/L (ref 22–32)
Calcium: 9 mg/dL (ref 8.9–10.3)
Chloride: 105 mmol/L (ref 98–111)
Creatinine, Ser: 1.32 mg/dL — ABNORMAL HIGH (ref 0.44–1.00)
GFR calc Af Amer: 48 mL/min — ABNORMAL LOW (ref >60)
GFR calc non Af Amer: 41 mL/min — ABNORMAL LOW (ref >60)
Glucose, Bld: 93 mg/dL (ref 70–99)
Potassium: 4.3 mmol/L (ref 3.5–5.1)
Sodium: 138 mmol/L (ref 135–145)
Total Bilirubin: 1.3 mg/dL — ABNORMAL HIGH (ref 0.3–1.2)
Total Protein: 6.2 g/dL — ABNORMAL LOW (ref 6.5–8.1)

## 2019-10-28 MED ORDER — DIPHENHYDRAMINE HCL 25 MG PO CAPS: 25.0000 mg | ORAL_CAPSULE | ORAL | Status: DC

## 2019-10-28 MED ORDER — TOCILIZUMAB 400 MG/20ML IV SOLN
4.0000 mg/kg | INTRAVENOUS | Status: AC
  Administered 2019-10-28: 472 mg via INTRAVENOUS
  Filled 2019-10-28: qty 3.6

## 2019-10-28 MED ORDER — ACETAMINOPHEN 325 MG PO TABS: 650.0000 mg | ORAL_TABLET | ORAL | Status: DC

## 2019-10-28 NOTE — Progress Notes (Signed)
Labs are stable.  Creatinine is elevated.  Actemra and ACE inhibitor support contribute to elevated creatinine.  We will continue to monitor labs.  Please forward labs to her PCP.

## 2019-11-09 DIAGNOSIS — D510 Vitamin B12 deficiency anemia due to intrinsic factor deficiency: Secondary | ICD-10-CM | POA: Diagnosis not present

## 2019-11-23 DIAGNOSIS — I10 Essential (primary) hypertension: Secondary | ICD-10-CM | POA: Diagnosis not present

## 2019-11-23 DIAGNOSIS — R252 Cramp and spasm: Secondary | ICD-10-CM | POA: Diagnosis not present

## 2019-11-23 DIAGNOSIS — L659 Nonscarring hair loss, unspecified: Secondary | ICD-10-CM | POA: Diagnosis not present

## 2019-11-23 DIAGNOSIS — R739 Hyperglycemia, unspecified: Secondary | ICD-10-CM | POA: Diagnosis not present

## 2019-11-23 DIAGNOSIS — Z6841 Body Mass Index (BMI) 40.0 and over, adult: Secondary | ICD-10-CM | POA: Diagnosis not present

## 2019-11-23 DIAGNOSIS — E785 Hyperlipidemia, unspecified: Secondary | ICD-10-CM | POA: Diagnosis not present

## 2019-11-23 DIAGNOSIS — L03116 Cellulitis of left lower limb: Secondary | ICD-10-CM | POA: Diagnosis not present

## 2019-11-24 ENCOUNTER — Other Ambulatory Visit: Payer: Self-pay | Admitting: Pharmacist

## 2019-11-24 DIAGNOSIS — Z79899 Other long term (current) drug therapy: Secondary | ICD-10-CM

## 2019-11-24 DIAGNOSIS — M0579 Rheumatoid arthritis with rheumatoid factor of multiple sites without organ or systems involvement: Secondary | ICD-10-CM

## 2019-11-24 NOTE — Progress Notes (Signed)
Next infusion scheduled for 11/25/19 and due for updated orders.  Last Visit: 09/29/19 Next Visit: 03/01/20 Labs: stable 10/28/19 TB Gold: negative 04/07/19  Orders placed for Actemra 4mg /kg every 28 days x 3 doses along with premedication of Tylenol and Benadryl.  Standing CBC/CMP orders placed.   Mariella Saa, PharmD, [place], CPP Clinical Specialty Pharmacist (Rheumatology and Pulmonology)  11/24/2019 3:17 PM

## 2019-11-25 ENCOUNTER — Other Ambulatory Visit: Payer: Self-pay

## 2019-11-25 ENCOUNTER — Encounter (HOSPITAL_COMMUNITY)
Admission: RE | Admit: 2019-11-25 | Discharge: 2019-11-25 | Disposition: A | Discharge status: Home / Self Care | Payer: Medicare Other | Source: Ambulatory Visit | Attending: Rheumatology | Admitting: Rheumatology

## 2019-11-25 DIAGNOSIS — M0579 Rheumatoid arthritis with rheumatoid factor of multiple sites without organ or systems involvement: Secondary | ICD-10-CM | POA: Diagnosis not present

## 2019-11-25 MED ORDER — ACETAMINOPHEN 325 MG PO TABS
ORAL_TABLET | ORAL | Status: AC
  Administered 2019-11-25: 650 mg
  Filled 2019-11-25: qty 2

## 2019-11-25 MED ORDER — TOCILIZUMAB 400 MG/20ML IV SOLN
4.0000 mg/kg | INTRAVENOUS | Status: DC
  Administered 2019-11-25: 480 mg via INTRAVENOUS
  Filled 2019-11-25: qty 4

## 2019-11-25 MED ORDER — DIPHENHYDRAMINE HCL 25 MG PO CAPS
ORAL_CAPSULE | ORAL | Status: AC
  Administered 2019-11-25: 25 mg
  Filled 2019-11-25: qty 1

## 2019-11-25 MED ORDER — DIPHENHYDRAMINE HCL 25 MG PO CAPS: 25.0000 mg | ORAL_CAPSULE | Freq: Once | ORAL | Status: DC

## 2019-11-25 MED ORDER — ACETAMINOPHEN 325 MG PO TABS: 650.0000 mg | ORAL_TABLET | Freq: Once | ORAL | Status: DC

## 2019-12-10 DIAGNOSIS — Z23 Encounter for immunization: Secondary | ICD-10-CM | POA: Diagnosis not present

## 2019-12-10 DIAGNOSIS — D510 Vitamin B12 deficiency anemia due to intrinsic factor deficiency: Secondary | ICD-10-CM | POA: Diagnosis not present

## 2019-12-14 DIAGNOSIS — E785 Hyperlipidemia, unspecified: Secondary | ICD-10-CM | POA: Diagnosis not present

## 2019-12-14 DIAGNOSIS — Z6841 Body Mass Index (BMI) 40.0 and over, adult: Secondary | ICD-10-CM | POA: Diagnosis not present

## 2019-12-14 DIAGNOSIS — L03116 Cellulitis of left lower limb: Secondary | ICD-10-CM | POA: Diagnosis not present

## 2019-12-21 ENCOUNTER — Telehealth: Payer: Self-pay

## 2019-12-21 DIAGNOSIS — L299 Pruritus, unspecified: Secondary | ICD-10-CM | POA: Diagnosis not present

## 2019-12-21 DIAGNOSIS — L570 Actinic keratosis: Secondary | ICD-10-CM | POA: Diagnosis not present

## 2019-12-21 DIAGNOSIS — I8310 Varicose veins of unspecified lower extremity with inflammation: Secondary | ICD-10-CM | POA: Diagnosis not present

## 2019-12-21 NOTE — Telephone Encounter (Signed)
Spoke with patient and advised patient it will be okay to switch her infusions to [facility] in [place]. Patient advised since they are part of [facility], they should be able to access the orders via Epic. Patient will reach out to the [facility] to verify if they will be able to do the infusions for her.

## 2019-12-21 NOTE — Telephone Encounter (Signed)
Ok to change infusions to [facility] in [place]

## 2019-12-21 NOTE — Telephone Encounter (Signed)
Patient called stating she just found out that they will only be doing cancer infusions, not Actemra so she will just stay at [facility].

## 2019-12-21 NOTE — Telephone Encounter (Signed)
Patient called requesting her Actemra infusion orders for her November infusion be sent to [facility] at [facility].  Patient states it is a lot more convenient to have her infusions done closer to her home.  Dr. Estanislado Pandy didn't like the computer system when infusion center was partnered with [facility], but now they are separated and are using the same system as all [facility] facilities.  Please advise.

## 2019-12-23 ENCOUNTER — Other Ambulatory Visit: Payer: Self-pay

## 2019-12-23 ENCOUNTER — Ambulatory Visit (HOSPITAL_COMMUNITY)
Admission: RE | Admit: 2019-12-23 | Discharge: 2019-12-23 | Disposition: A | Discharge status: Home / Self Care | Payer: Medicare Other | Source: Ambulatory Visit | Attending: Rheumatology | Admitting: Rheumatology

## 2019-12-23 DIAGNOSIS — M0579 Rheumatoid arthritis with rheumatoid factor of multiple sites without organ or systems involvement: Secondary | ICD-10-CM | POA: Diagnosis not present

## 2019-12-23 MED ORDER — DIPHENHYDRAMINE HCL 25 MG PO CAPS
ORAL_CAPSULE | ORAL | Status: AC
  Filled 2019-12-23: qty 1

## 2019-12-23 MED ORDER — ACETAMINOPHEN 325 MG PO TABS: 650.0000 mg | ORAL_TABLET | Freq: Once | ORAL | Status: DC

## 2019-12-23 MED ORDER — DIPHENHYDRAMINE HCL 25 MG PO CAPS: 25.0000 mg | ORAL_CAPSULE | Freq: Once | ORAL | Status: DC

## 2019-12-23 MED ORDER — TOCILIZUMAB 400 MG/20ML IV SOLN
4.0000 mg/kg | INTRAVENOUS | Status: DC
  Administered 2019-12-23: 476 mg via INTRAVENOUS
  Filled 2019-12-23: qty 20

## 2019-12-23 MED ORDER — ACETAMINOPHEN 325 MG PO TABS
ORAL_TABLET | ORAL | Status: AC
  Filled 2019-12-23: qty 2

## 2020-01-10 DIAGNOSIS — D510 Vitamin B12 deficiency anemia due to intrinsic factor deficiency: Secondary | ICD-10-CM | POA: Diagnosis not present

## 2020-01-21 ENCOUNTER — Ambulatory Visit (HOSPITAL_COMMUNITY)
Admission: RE | Admit: 2020-01-21 | Discharge: 2020-01-21 | Disposition: A | Discharge status: Home / Self Care | Payer: Medicare Other | Source: Ambulatory Visit | Attending: Rheumatology | Admitting: Rheumatology

## 2020-01-21 ENCOUNTER — Other Ambulatory Visit: Payer: Self-pay

## 2020-01-21 DIAGNOSIS — Z79899 Other long term (current) drug therapy: Secondary | ICD-10-CM | POA: Insufficient documentation

## 2020-01-21 DIAGNOSIS — M0579 Rheumatoid arthritis with rheumatoid factor of multiple sites without organ or systems involvement: Secondary | ICD-10-CM | POA: Insufficient documentation

## 2020-01-21 LAB — COMPREHENSIVE METABOLIC PANEL
ALT: 21 U/L (ref 0–44)
AST: 26 U/L (ref 15–41)
Albumin: 3.6 g/dL (ref 3.5–5.0)
Alkaline Phosphatase: 55 U/L (ref 38–126)
Anion gap: 8 (ref 5–15)
BUN: 31 mg/dL — ABNORMAL HIGH (ref 8–23)
CO2: 23 mmol/L (ref 22–32)
Calcium: 9.2 mg/dL (ref 8.9–10.3)
Chloride: 108 mmol/L (ref 98–111)
Creatinine, Ser: 1.38 mg/dL — ABNORMAL HIGH (ref 0.44–1.00)
GFR, Estimated: 41 mL/min — ABNORMAL LOW (ref >60)
Glucose, Bld: 98 mg/dL (ref 70–99)
Potassium: 4.5 mmol/L (ref 3.5–5.1)
Sodium: 139 mmol/L (ref 135–145)
Total Bilirubin: 1.3 mg/dL — ABNORMAL HIGH (ref 0.3–1.2)
Total Protein: 6.3 g/dL — ABNORMAL LOW (ref 6.5–8.1)

## 2020-01-21 LAB — CBC WITH DIFFERENTIAL/PLATELET
Abs Immature Granulocytes: 0.03 10*3/uL (ref 0.00–0.07)
Basophils Absolute: 0 10*3/uL (ref 0.0–0.1)
Basophils Relative: 0 %
Eosinophils Absolute: 0.2 10*3/uL (ref 0.0–0.5)
Eosinophils Relative: 3 %
HCT: 39.4 % (ref 36.0–46.0)
Hemoglobin: 13 g/dL (ref 12.0–15.0)
Immature Granulocytes: 0 %
Lymphocytes Relative: 23 %
Lymphs Abs: 1.7 10*3/uL (ref 0.7–4.0)
MCH: 33.5 pg (ref 26.0–34.0)
MCHC: 33 g/dL (ref 30.0–36.0)
MCV: 101.5 fL — ABNORMAL HIGH (ref 80.0–100.0)
Monocytes Absolute: 0.6 10*3/uL (ref 0.1–1.0)
Monocytes Relative: 8 %
Neutro Abs: 5 10*3/uL (ref 1.7–7.7)
Neutrophils Relative %: 66 %
Platelets: 294 10*3/uL (ref 150–400)
RBC: 3.88 MIL/uL (ref 3.87–5.11)
RDW: 12.5 % (ref 11.5–15.5)
WBC: 7.6 10*3/uL (ref 4.0–10.5)
nRBC: 0 % (ref 0.0–0.2)

## 2020-01-21 MED ORDER — ACETAMINOPHEN 325 MG PO TABS: 650.0000 mg | ORAL_TABLET | Freq: Once | ORAL | Status: DC

## 2020-01-21 MED ORDER — TOCILIZUMAB 400 MG/20ML IV SOLN
4.0000 mg/kg | INTRAVENOUS | Status: DC
  Administered 2020-01-21: 476 mg via INTRAVENOUS
  Filled 2020-01-21: qty 20

## 2020-01-21 MED ORDER — DIPHENHYDRAMINE HCL 25 MG PO CAPS: 25.0000 mg | ORAL_CAPSULE | Freq: Once | ORAL | Status: DC

## 2020-01-21 NOTE — Progress Notes (Signed)
GFR is low but is stable.  She is on HCTZ and ACE inhibitor.  CBC is stable.  Please forward the labs to her PCP.

## 2020-02-03 ENCOUNTER — Telehealth: Payer: Self-pay | Admitting: Pharmacy Technician

## 2020-02-03 NOTE — Telephone Encounter (Signed)
   Medicare covers 80% of the infusion and no authorization is required, and the supplement would cover the 20% of the cost that was not paid for by Medicare as long as Medicare covered the medication after patient pays her $233 Medicare B deductiblerify coverage. Active benefits.

## 2020-02-10 DIAGNOSIS — D510 Vitamin B12 deficiency anemia due to intrinsic factor deficiency: Secondary | ICD-10-CM | POA: Diagnosis not present

## 2020-02-16 NOTE — Progress Notes (Signed)
Office Visit Note  Patient: [name] [name]             Date of Birth: May 02, 1950           MRN: [id number]             PCP: Hurshel Party, NP Referring: Hurshel Party, NP Visit Date: 03/01/2020 Occupation: @GUAROCC @  Subjective:  Medication monitoring   History of Present Illness: [name] [name] is a 69 y.o. female with history of seropositive rheumatoid arthritis and osteoarthritis.  Patient is on Actemra 400 mg IV infusions every 4 weeks.  Her next infusion is on 03/16/20. She denies any recent rheumatoid arthritis flares.    She states she experiences some discomfort and pain with range of motion in the right shoulder joint.  Her hand pain and stiffness has improved and she is able to make a complete fist bilaterally.  She continues to use a cane, walker, or Rollator walker to assist with ambulation.  Patient reports that since starting on facet but she has noticed improvement in her joint pain and stiffness.  Her right shoulder joint pain and left ankle joint pain have improved significantly.     Activities of Daily Living:  Patient reports morning stiffness for 0  none.   Patient Denies nocturnal pain.  Difficulty dressing/grooming: Denies Difficulty climbing stairs: Denies Difficulty getting out of chair: Denies Difficulty using hands for taps, buttons, cutlery, and/or writing: Reports  Review of Systems  Constitutional: Positive for fatigue.  HENT: Positive for mouth dryness. Negative for mouth sores and nose dryness.   Eyes: Positive for dryness. Negative for pain and visual disturbance.  Respiratory: Negative for cough, hemoptysis, shortness of breath and difficulty breathing.   Cardiovascular: Positive for swelling in legs/feet. Negative for chest pain, palpitations and hypertension.  Gastrointestinal: Negative for blood in stool, constipation and diarrhea.  Endocrine: Negative for increased urination.  Genitourinary: Negative for painful urination.  Musculoskeletal: Positive  for arthralgias, gait problem and joint pain. Negative for joint swelling, myalgias, muscle weakness, morning stiffness, muscle tenderness and myalgias.  Skin: Negative for color change, pallor, rash, hair loss, nodules/bumps, skin tightness, ulcers and sensitivity to sunlight.  Allergic/Immunologic: Negative for susceptible to infections.  Neurological: Negative for dizziness, numbness, headaches and weakness.  Hematological: Negative for bruising/bleeding tendency and swollen glands.  Psychiatric/Behavioral: Negative for depressed mood and sleep disturbance. The patient is not nervous/anxious.     PMFS History:  Patient Active Problem List   Diagnosis Date Noted  . Dyspnea 11/05/2013  . Rheumatoid arthritis (HCC)   . Hypertension   . GERD (gastroesophageal reflux disease)   . Morbid obesity (HCC) 03/10/2013  . S/P right THA, AA 03/09/2013    Past Medical History:  Diagnosis Date  . Deviated nasal septum    right side, cannot breathe out of right septum  . GERD (gastroesophageal reflux disease)   . Hypertension   . Plantar fasciitis   . Rheumatoid arthritis (HCC)    oa and ra [facility] [place], [place]  . UTI (lower urinary tract infection) 03/02/2013   treated with keflex for hip surgery on 03/09/13    Family History  Problem Relation Age of Onset  . Heart disease Father   . Kidney disease Father   . Heart attack Father   . Heart disease Brother   . Cancer Brother        agent orange   . Rheum arthritis Maternal Aunt   . Hypertension Mother   . Alzheimer's  disease Mother   . Heart attack Brother   . Vascular Disease Brother   . Kidney disease Brother    Past Surgical History:  Procedure Laterality Date  . [place] N/A 04/21/2015  . DIAGNOSTIC MAMMOGRAM Bilateral 04/05/2016  . ingrown toenail removed  5-6 yrs ago  . TOTAL HIP ARTHROPLASTY Right 03/09/2013   Procedure: RIGHT TOTAL HIP ARTHROPLASTY ANTERIOR APPROACH;  Surgeon:  Mauri Pole, MD;  Location: [facility];  Service: Orthopedics;  Laterality: Right;   Social History   Social History Narrative  . Not on file   Immunization History  Administered Date(s) Administered  . Influenza Split 12/26/2012  . Influenza-Unspecified 11/29/2013  . Moderna Sars-Covid-2 Vaccination 04/01/2019, 04/27/2019, 10/12/2019  . Pneumococcal Polysaccharide-23 03/11/2013     Objective: Vital Signs: BP (!) 144/84 (BP Location: Left Arm, Patient Position: Sitting, Cuff Size: Normal)   Pulse 64   Resp 18   Ht $R'5\' 2"'jc$  (1.575 m)   Wt 267 lb (121.1 kg)   BMI 48.83 kg/m    Physical Exam Vitals and nursing note reviewed.  Constitutional:      Appearance: She is well-developed and well-nourished.  HENT:     Head: Normocephalic and atraumatic.  Eyes:     Extraocular Movements: EOM normal.     Conjunctiva/sclera: Conjunctivae normal.  Cardiovascular:     Pulses: Intact distal pulses.  Pulmonary:     Effort: Pulmonary effort is normal.  Abdominal:     Palpations: Abdomen is soft.  Musculoskeletal:     Cervical back: Normal range of motion.  Skin:    General: Skin is warm and dry.     Capillary Refill: Capillary refill takes less than 2 seconds.  Neurological:     Mental Status: She is alert and oriented to person, place, and time.  Psychiatric:        Mood and Affect: Mood and affect normal.        Behavior: Behavior normal.      Musculoskeletal Exam: C-spine has slightly limited range of motion with lateral rotation.  Postural thoracic kyphosis noted.  No midline spinal tenderness noted.  Left shoulder has full range of motion with no discomfort.  Right shoulder abduction to about 90 degrees.  Elbow joints have good range of motion with no discomfort.  Limited range of motion of both wrist joints but no tenderness or inflammation was noted.  No tenderness or synovitis of MCP joints.  Complete fist formation bilaterally.  PIP and DIP thickening consistent with  osteoarthritis of both hands noted.  Knee joints good ROM with no discomfort.  Warmth of left knee.   CDAI Exam: CDAI Score: 0.2  Patient Global: 1 mm; Provider Global: 1 mm Swollen: 0 ; Tender: 0  Joint Exam 03/01/2020   No joint exam has been documented for this visit   There is currently no information documented on the homunculus. Go to the Rheumatology activity and complete the homunculus joint exam.  Investigation: No additional findings.  Imaging: No results found.  Recent Labs: Lab Results  Component Value Date   WBC 7.6 01/21/2020   HGB 13.0 01/21/2020   PLT 294 01/21/2020   NA 139 01/21/2020   K 4.5 01/21/2020   CL 108 01/21/2020   CO2 23 01/21/2020   GLUCOSE 98 01/21/2020   BUN 31 (H) 01/21/2020   CREATININE 1.38 (H) 01/21/2020   BILITOT 1.3 (H) 01/21/2020   ALKPHOS 55 01/21/2020  AST 26 01/21/2020   ALT 21 01/21/2020   PROT 6.3 (L) 01/21/2020   ALBUMIN 3.6 01/21/2020   CALCIUM 9.2 01/21/2020   GFRAA 48 (L) 10/28/2019   QFTBGOLDPLUS NEGATIVE 04/07/2019    Speciality Comments: No specialty comments available.  Procedures:  No procedures performed Allergies: Allopurinol, Asmanex (120 metered doses) [mometasone furoate], Guaifenesin & derivatives, Methotrexate derivatives, Methylprednisolone, Monosodium glutamate, Prednisone, Prevnar 13 [pneumococcal 13-val conj vacc], Shingrix [zoster vac recomb adjuvanted], Tetanus toxoids, Baclofen, Ciprofloxacin, Finasteride, Humira [adalimumab], Leflunomide, Polysporin [bacitracin-polymyxin b], Rosuvastatin, Neosporin [neomycin-bacitracin zn-polymyx], and Sulfa antibiotics   Assessment / Plan:     Visit Diagnoses: Rheumatoid arthritis with rheumatoid factor of multiple sites without organ or systems involvement (HCC) - Dx 20 years ago, Followed by Dr. Dossie Der. 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 ESR 18, CRP<1: She has no synovitis on examination today.  She is clinically doing well on Actemra 400 mg IV infusions every 4 weeks.  Her  next infusion is scheduled for 03/16/20.  She has not had any recent rheumatoid arthritis flares.  X-rays of both hands and feet were updated on 04/07/19 and were consistent with severe rheumatoid arthritis and osteoarthritis overlap. We will repeat x-rays in 2-3 weeks to track radiographic progression.  She recently started on vascepa for management of hypertriglyceridemia, and she has noticed significant clinical improvement in her joint pain and stiffness since then. Her right shoulder and left ankle joint pain have greatly improved. She will continue on the current treatment regimen.  She was advised to notify us if she develops increased joint pain or joint swelling.  She will follow up in the office in 5 months.  High risk medication use - Actemra 400 mg/20 ml IV q4wk (started in 2016).  Discontinued Humira due to experiencing increased shortness of breath.  D/c MTX and Arava-SOB. CBC and CMP updated on 01/21/20.  She has lab work drawn at [facility] with infusions.  TB gold negative on 04/08/19.  Future order placed today.    - Plan: QuantiFERON-TB Gold Plus She has not had any recent infections. She was advised to hold actemra infusions if she develops signs or symptoms of an infection and to resume once the infection has completely cleared. She voiced understanding.  She has received all 3 covid-19 vaccine doses.   Status post total hip replacement, right: Doing well.   Primary osteoarthritis of both knees: She has good ROM of both knee joints with no discomfort.  Mild warmth of the left knee joint noted.  She has not had any recent falls.  She uses either a cane, rollator walker, or a folding walker to assist with ambulation.   Positive TB test: Future order for TB gold placed today.   Other medical conditions are listed as follows:   History of gastroesophageal reflux (GERD)  History of anemia  History of hyperlipidemia  Essential hypertension  Former smoker  Family history of  rheumatoid arthritis  Orders: Orders Placed This Encounter  Procedures  . QuantiFERON-TB Gold Plus   No orders of the defined types were placed in this encounter.    Follow-Up Instructions: Return in about 5 months (around 07/30/2020) for Rheumatoid arthritis, Osteoarthritis.   Ofilia Neas, PA-C  Note - This record has been created using Dragon software.  Chart creation errors have been sought, but may not always  have been located. Such creation errors do not reflect on  the standard of medical care.

## 2020-02-17 ENCOUNTER — Other Ambulatory Visit: Payer: Self-pay

## 2020-02-17 ENCOUNTER — Ambulatory Visit (HOSPITAL_COMMUNITY)
Admission: RE | Admit: 2020-02-17 | Discharge: 2020-02-17 | Disposition: A | Discharge status: Home / Self Care | Payer: Medicare Other | Source: Ambulatory Visit | Attending: Rheumatology | Admitting: Rheumatology

## 2020-02-17 DIAGNOSIS — M0579 Rheumatoid arthritis with rheumatoid factor of multiple sites without organ or systems involvement: Secondary | ICD-10-CM | POA: Diagnosis not present

## 2020-02-17 MED ORDER — TOCILIZUMAB 400 MG/20ML IV SOLN
4.0000 mg/kg | INTRAVENOUS | Status: DC
  Administered 2020-02-17: 10:00:00 476 mg via INTRAVENOUS
  Filled 2020-02-17: qty 23.8

## 2020-02-17 MED ORDER — ACETAMINOPHEN 325 MG PO TABS: 650.0000 mg | ORAL_TABLET | Freq: Once | ORAL | Status: DC

## 2020-02-17 MED ORDER — DIPHENHYDRAMINE HCL 25 MG PO CAPS: 25.0000 mg | ORAL_CAPSULE | Freq: Once | ORAL | Status: DC

## 2020-03-01 ENCOUNTER — Ambulatory Visit (INDEPENDENT_AMBULATORY_CARE_PROVIDER_SITE_OTHER): Payer: Medicare Other | Admitting: Physician Assistant

## 2020-03-01 ENCOUNTER — Encounter: Payer: Self-pay | Admitting: Physician Assistant

## 2020-03-01 ENCOUNTER — Other Ambulatory Visit: Payer: Self-pay

## 2020-03-01 VITALS — BP 144/84 | HR 64 | Resp 18 | Ht 62.0 in | Wt 267.0 lb

## 2020-03-01 DIAGNOSIS — Z8639 Personal history of other endocrine, nutritional and metabolic disease: Secondary | ICD-10-CM

## 2020-03-01 DIAGNOSIS — Z87891 Personal history of nicotine dependence: Secondary | ICD-10-CM

## 2020-03-01 DIAGNOSIS — Z8261 Family history of arthritis: Secondary | ICD-10-CM | POA: Diagnosis not present

## 2020-03-01 DIAGNOSIS — Z8719 Personal history of other diseases of the digestive system: Secondary | ICD-10-CM | POA: Diagnosis not present

## 2020-03-01 DIAGNOSIS — M0579 Rheumatoid arthritis with rheumatoid factor of multiple sites without organ or systems involvement: Secondary | ICD-10-CM | POA: Diagnosis not present

## 2020-03-01 DIAGNOSIS — M170 Bilateral primary osteoarthritis of knee: Secondary | ICD-10-CM | POA: Diagnosis not present

## 2020-03-01 DIAGNOSIS — Z79899 Other long term (current) drug therapy: Secondary | ICD-10-CM | POA: Diagnosis not present

## 2020-03-01 DIAGNOSIS — Z862 Personal history of diseases of the blood and blood-forming organs and certain disorders involving the immune mechanism: Secondary | ICD-10-CM

## 2020-03-01 DIAGNOSIS — I10 Essential (primary) hypertension: Secondary | ICD-10-CM

## 2020-03-01 DIAGNOSIS — Z96641 Presence of right artificial hip joint: Secondary | ICD-10-CM

## 2020-03-01 DIAGNOSIS — R7611 Nonspecific reaction to tuberculin skin test without active tuberculosis: Secondary | ICD-10-CM

## 2020-03-16 ENCOUNTER — Ambulatory Visit (HOSPITAL_COMMUNITY)
Admission: RE | Admit: 2020-03-16 | Discharge: 2020-03-16 | Disposition: A | Discharge status: Home / Self Care | Payer: Medicare Other | Source: Ambulatory Visit | Attending: Rheumatology | Admitting: Rheumatology

## 2020-03-16 ENCOUNTER — Other Ambulatory Visit: Payer: Self-pay

## 2020-03-16 DIAGNOSIS — M0579 Rheumatoid arthritis with rheumatoid factor of multiple sites without organ or systems involvement: Secondary | ICD-10-CM | POA: Diagnosis not present

## 2020-03-16 DIAGNOSIS — D510 Vitamin B12 deficiency anemia due to intrinsic factor deficiency: Secondary | ICD-10-CM | POA: Diagnosis not present

## 2020-03-16 LAB — CBC WITH DIFFERENTIAL/PLATELET
Abs Immature Granulocytes: 0.08 10*3/uL — ABNORMAL HIGH (ref 0.00–0.07)
Basophils Absolute: 0 10*3/uL (ref 0.0–0.1)
Basophils Relative: 0 %
Eosinophils Absolute: 0.2 10*3/uL (ref 0.0–0.5)
Eosinophils Relative: 3 %
HCT: 38.2 % (ref 36.0–46.0)
Hemoglobin: 13.2 g/dL (ref 12.0–15.0)
Immature Granulocytes: 1 %
Lymphocytes Relative: 19 %
Lymphs Abs: 1.7 10*3/uL (ref 0.7–4.0)
MCH: 33.8 pg (ref 26.0–34.0)
MCHC: 34.6 g/dL (ref 30.0–36.0)
MCV: 97.7 fL (ref 80.0–100.0)
Monocytes Absolute: 0.6 10*3/uL (ref 0.1–1.0)
Monocytes Relative: 7 %
Neutro Abs: 6 10*3/uL (ref 1.7–7.7)
Neutrophils Relative %: 70 %
Platelets: 291 10*3/uL (ref 150–400)
RBC: 3.91 MIL/uL (ref 3.87–5.11)
RDW: 12.7 % (ref 11.5–15.5)
WBC: 8.6 10*3/uL (ref 4.0–10.5)
nRBC: 0 % (ref 0.0–0.2)

## 2020-03-16 LAB — COMPREHENSIVE METABOLIC PANEL
ALT: 21 U/L (ref 0–44)
AST: 25 U/L (ref 15–41)
Albumin: 3.6 g/dL (ref 3.5–5.0)
Alkaline Phosphatase: 50 U/L (ref 38–126)
Anion gap: 12 (ref 5–15)
BUN: 29 mg/dL — ABNORMAL HIGH (ref 8–23)
CO2: 23 mmol/L (ref 22–32)
Calcium: 9.2 mg/dL (ref 8.9–10.3)
Chloride: 105 mmol/L (ref 98–111)
Creatinine, Ser: 1.24 mg/dL — ABNORMAL HIGH (ref 0.44–1.00)
GFR, Estimated: 47 mL/min — ABNORMAL LOW (ref >60)
Glucose, Bld: 100 mg/dL — ABNORMAL HIGH (ref 70–99)
Potassium: 4.4 mmol/L (ref 3.5–5.1)
Sodium: 140 mmol/L (ref 135–145)
Total Bilirubin: 1.5 mg/dL — ABNORMAL HIGH (ref 0.3–1.2)
Total Protein: 6.2 g/dL — ABNORMAL LOW (ref 6.5–8.1)

## 2020-03-16 MED ORDER — ACETAMINOPHEN 325 MG PO TABS: 650.0000 mg | ORAL_TABLET | Freq: Once | ORAL | Status: DC

## 2020-03-16 MED ORDER — DIPHENHYDRAMINE HCL 25 MG PO CAPS: 25.0000 mg | ORAL_CAPSULE | Freq: Once | ORAL | Status: DC

## 2020-03-16 MED ORDER — TOCILIZUMAB 400 MG/20ML IV SOLN
4.0000 mg/kg | INTRAVENOUS | Status: AC
  Administered 2020-03-16: 476 mg via INTRAVENOUS
  Filled 2020-03-16: qty 23.8

## 2020-03-16 NOTE — Progress Notes (Signed)
CBC is normal, GFR is low but is stable.

## 2020-03-21 DIAGNOSIS — E785 Hyperlipidemia, unspecified: Secondary | ICD-10-CM | POA: Diagnosis not present

## 2020-03-21 DIAGNOSIS — R739 Hyperglycemia, unspecified: Secondary | ICD-10-CM | POA: Diagnosis not present

## 2020-03-21 DIAGNOSIS — Z6841 Body Mass Index (BMI) 40.0 and over, adult: Secondary | ICD-10-CM | POA: Diagnosis not present

## 2020-03-21 DIAGNOSIS — Z1211 Encounter for screening for malignant neoplasm of colon: Secondary | ICD-10-CM | POA: Diagnosis not present

## 2020-03-21 DIAGNOSIS — L659 Nonscarring hair loss, unspecified: Secondary | ICD-10-CM | POA: Diagnosis not present

## 2020-03-21 DIAGNOSIS — I10 Essential (primary) hypertension: Secondary | ICD-10-CM | POA: Diagnosis not present

## 2020-04-11 DIAGNOSIS — L03032 Cellulitis of left toe: Secondary | ICD-10-CM | POA: Diagnosis not present

## 2020-04-12 ENCOUNTER — Telehealth: Payer: Self-pay | Admitting: Rheumatology

## 2020-04-12 NOTE — Telephone Encounter (Signed)
Patient started an antibiotic, and wanted to make sure she could still get infusion. Infusion scheduled for this Thursday, 04/13/2020 . Patient started antibiotic yesterday, and will be on it for 10 days total. Patient spoke with the infusion center already, and was told to call here for direction. Please call patient to advise.

## 2020-04-12 NOTE — Telephone Encounter (Signed)
Patient advised she should postpone her infusion until she has completed her antibiotic and the infection has cleared. Patient will see PCP on 04/20/2020 and will reschedule infusion after that follow up.

## 2020-04-13 ENCOUNTER — Inpatient Hospital Stay (HOSPITAL_COMMUNITY): Admission: RE | Admit: 2020-04-13 | Payer: Medicare Other | Source: Ambulatory Visit

## 2020-04-17 DIAGNOSIS — D510 Vitamin B12 deficiency anemia due to intrinsic factor deficiency: Secondary | ICD-10-CM | POA: Diagnosis not present

## 2020-04-20 ENCOUNTER — Telehealth: Payer: Self-pay | Admitting: Rheumatology

## 2020-04-20 ENCOUNTER — Other Ambulatory Visit: Payer: Self-pay | Admitting: Pharmacist

## 2020-04-20 DIAGNOSIS — M0579 Rheumatoid arthritis with rheumatoid factor of multiple sites without organ or systems involvement: Secondary | ICD-10-CM

## 2020-04-20 DIAGNOSIS — Z79899 Other long term (current) drug therapy: Secondary | ICD-10-CM

## 2020-04-20 NOTE — Progress Notes (Addendum)
Next Actemra infusion not yet scheduled and patient is due for updated orders. Patient was taking antibiotic prescribed by podiatrist. Per office visit with Dr. Gean Quint, patient will stop all wound care and antibiotics. Patient cleared to receive Actemra infusions again.  Dose: Actemra 4mg /kg every 28 days  Last Clinic Visit: 03/01/20 Next Clinic Visit: 08/02/20  Last infusion: 03/16/20 Labs: CBC/CMP drawn 03/16/20 (CBC wnl, GFR low but stable)  TB Gold: negative on 04/07/19   Orders placed for Actemra 4 mg/kg every 28 days x 3 doses along with premedication of Tylenol and Benadryl to be administered 30 minutes before medication infusion. Standing CBC with diff/platelet and CMP with GFR orders placed to be drawn every 2 months.  Next TB gold due this month. Will place order to be drawn with upcoming infusion.  Called patient and left VM advising patient call [facility] [phone]) to schedule infusion  Knox Saliva, PharmD, MPH Clinical Pharmacist (Rheumatology and Pulmonology)  Addendum: Actemra scheduled for 05/08/20. Will f/u

## 2020-04-20 NOTE — Telephone Encounter (Signed)
Actemra infusion orders placed including labs to be drawn with every other infusion. Order also placed for TB gold. Last negative for 04/07/19. Left VM with patient with phone number for [facility] advising she schedule. Will f/u to ensure Actemra infusion is scheduled

## 2020-04-20 NOTE — Telephone Encounter (Signed)
Patient calling to let you know a fax will come from Dr. Gretta Arab, Podiatrist to let you know patient does not have an infection, and is not taking a antibiotic anymore. Patient needs you to contact [facility] to let them know she can schedule infusion again. Please call to advise.

## 2020-04-24 DIAGNOSIS — N289 Disorder of kidney and ureter, unspecified: Secondary | ICD-10-CM | POA: Diagnosis not present

## 2020-04-25 DIAGNOSIS — Z1159 Encounter for screening for other viral diseases: Secondary | ICD-10-CM | POA: Diagnosis not present

## 2020-04-25 DIAGNOSIS — Z20828 Contact with and (suspected) exposure to other viral communicable diseases: Secondary | ICD-10-CM | POA: Diagnosis not present

## 2020-04-25 HISTORY — PX: COLON BIOPSY: SHX1369

## 2020-04-26 NOTE — Telephone Encounter (Signed)
Actemra infusion scheduled for 05/08/20

## 2020-04-27 ENCOUNTER — Telehealth: Payer: Self-pay | Admitting: Allergy and Immunology

## 2020-04-27 ENCOUNTER — Other Ambulatory Visit: Payer: Self-pay

## 2020-04-27 MED ORDER — ALBUTEROL SULFATE HFA 108 (90 BASE) MCG/ACT IN AERS: INHALATION_SPRAY | RESPIRATORY_TRACT | 1 refills | Status: DC

## 2020-04-27 MED ORDER — ARNUITY ELLIPTA 200 MCG/ACT IN AEPB: INHALATION_SPRAY | RESPIRATORY_TRACT | 1 refills | Status: DC

## 2020-04-27 MED ORDER — FLUTICASONE PROPIONATE 50 MCG/ACT NA SUSP: NASAL | 1 refills | Status: DC

## 2020-04-27 NOTE — Telephone Encounter (Signed)
Refills for ventolin, Flonase and Arnuity sent to [facility] in [place]. Patient was notified.

## 2020-04-27 NOTE — Telephone Encounter (Signed)
Emnet called in and stated she had an insurance and pharmacy change.  [name] [name] would like  Ventolin, Flonase, and Arnuity sent to [facility] in [place] please.

## 2020-05-01 ENCOUNTER — Ambulatory Visit (HOSPITAL_COMMUNITY): Payer: Medicare Other

## 2020-05-02 DIAGNOSIS — D126 Benign neoplasm of colon, unspecified: Secondary | ICD-10-CM | POA: Diagnosis not present

## 2020-05-02 DIAGNOSIS — K648 Other hemorrhoids: Secondary | ICD-10-CM | POA: Diagnosis not present

## 2020-05-02 DIAGNOSIS — Z86010 Personal history of colonic polyps: Secondary | ICD-10-CM | POA: Diagnosis not present

## 2020-05-02 DIAGNOSIS — K5730 Diverticulosis of large intestine without perforation or abscess without bleeding: Secondary | ICD-10-CM | POA: Diagnosis not present

## 2020-05-02 DIAGNOSIS — Z1211 Encounter for screening for malignant neoplasm of colon: Secondary | ICD-10-CM | POA: Diagnosis not present

## 2020-05-02 DIAGNOSIS — K635 Polyp of colon: Secondary | ICD-10-CM | POA: Diagnosis not present

## 2020-05-02 DIAGNOSIS — D124 Benign neoplasm of descending colon: Secondary | ICD-10-CM | POA: Diagnosis not present

## 2020-05-04 DIAGNOSIS — N6489 Other specified disorders of breast: Secondary | ICD-10-CM | POA: Diagnosis not present

## 2020-05-04 DIAGNOSIS — R928 Other abnormal and inconclusive findings on diagnostic imaging of breast: Secondary | ICD-10-CM | POA: Diagnosis not present

## 2020-05-08 ENCOUNTER — Ambulatory Visit (HOSPITAL_COMMUNITY)
Admission: RE | Admit: 2020-05-08 | Discharge: 2020-05-08 | Disposition: A | Discharge status: Home / Self Care | Payer: Medicare Other | Source: Ambulatory Visit | Attending: Rheumatology | Admitting: Rheumatology

## 2020-05-08 ENCOUNTER — Other Ambulatory Visit: Payer: Self-pay

## 2020-05-08 DIAGNOSIS — M0579 Rheumatoid arthritis with rheumatoid factor of multiple sites without organ or systems involvement: Secondary | ICD-10-CM | POA: Insufficient documentation

## 2020-05-08 DIAGNOSIS — Z79899 Other long term (current) drug therapy: Secondary | ICD-10-CM | POA: Diagnosis not present

## 2020-05-08 LAB — COMPREHENSIVE METABOLIC PANEL
ALT: 18 U/L (ref 0–44)
AST: 18 U/L (ref 15–41)
Albumin: 3 g/dL — ABNORMAL LOW (ref 3.5–5.0)
Alkaline Phosphatase: 61 U/L (ref 38–126)
Anion gap: 8 (ref 5–15)
BUN: 30 mg/dL — ABNORMAL HIGH (ref 8–23)
CO2: 25 mmol/L (ref 22–32)
Calcium: 9.1 mg/dL (ref 8.9–10.3)
Chloride: 105 mmol/L (ref 98–111)
Creatinine, Ser: 1.29 mg/dL — ABNORMAL HIGH (ref 0.44–1.00)
GFR, Estimated: 45 mL/min — ABNORMAL LOW (ref >60)
Glucose, Bld: 102 mg/dL — ABNORMAL HIGH (ref 70–99)
Potassium: 4.5 mmol/L (ref 3.5–5.1)
Sodium: 138 mmol/L (ref 135–145)
Total Bilirubin: 0.8 mg/dL (ref 0.3–1.2)
Total Protein: 7.2 g/dL (ref 6.5–8.1)

## 2020-05-08 LAB — CBC WITH DIFFERENTIAL/PLATELET
Abs Immature Granulocytes: 0.06 10*3/uL (ref 0.00–0.07)
Basophils Absolute: 0 10*3/uL (ref 0.0–0.1)
Basophils Relative: 0 %
Eosinophils Absolute: 0.2 10*3/uL (ref 0.0–0.5)
Eosinophils Relative: 2 %
HCT: 36.1 % (ref 36.0–46.0)
Hemoglobin: 11.7 g/dL — ABNORMAL LOW (ref 12.0–15.0)
Immature Granulocytes: 1 %
Lymphocytes Relative: 14 %
Lymphs Abs: 1.6 10*3/uL (ref 0.7–4.0)
MCH: 32.1 pg (ref 26.0–34.0)
MCHC: 32.4 g/dL (ref 30.0–36.0)
MCV: 99.2 fL (ref 80.0–100.0)
Monocytes Absolute: 0.7 10*3/uL (ref 0.1–1.0)
Monocytes Relative: 6 %
Neutro Abs: 8.8 10*3/uL — ABNORMAL HIGH (ref 1.7–7.7)
Neutrophils Relative %: 77 %
Platelets: 572 10*3/uL — ABNORMAL HIGH (ref 150–400)
RBC: 3.64 MIL/uL — ABNORMAL LOW (ref 3.87–5.11)
RDW: 11.9 % (ref 11.5–15.5)
WBC: 11.5 10*3/uL — ABNORMAL HIGH (ref 4.0–10.5)
nRBC: 0 % (ref 0.0–0.2)

## 2020-05-08 MED ORDER — ACETAMINOPHEN 325 MG PO TABS: 650.0000 mg | ORAL_TABLET | Freq: Once | ORAL | Status: DC

## 2020-05-08 MED ORDER — SODIUM CHLORIDE 0.9 % IV SOLN
4.0000 mg/kg | INTRAVENOUS | Status: DC
  Administered 2020-05-08: 484 mg via INTRAVENOUS
  Filled 2020-05-08: qty 4.2

## 2020-05-08 MED ORDER — DIPHENHYDRAMINE HCL 25 MG PO CAPS: 25.0000 mg | ORAL_CAPSULE | Freq: Once | ORAL | Status: DC

## 2020-05-08 NOTE — Progress Notes (Signed)
CBC and CMP stable.   Creatinine remains elevated and GFR is low at 45.  Please advise the patient to avoid taking NSAIDs.

## 2020-05-10 DIAGNOSIS — Z9181 History of falling: Secondary | ICD-10-CM | POA: Diagnosis not present

## 2020-05-10 DIAGNOSIS — E785 Hyperlipidemia, unspecified: Secondary | ICD-10-CM | POA: Diagnosis not present

## 2020-05-10 DIAGNOSIS — Z0000 Encounter for general adult medical examination without abnormal findings: Secondary | ICD-10-CM | POA: Diagnosis not present

## 2020-05-10 DIAGNOSIS — Z1331 Encounter for screening for depression: Secondary | ICD-10-CM | POA: Diagnosis not present

## 2020-05-10 LAB — QUANTIFERON-TB GOLD PLUS (RQFGPL)
QuantiFERON Mitogen Value: 0.64 IU/mL
QuantiFERON Nil Value: 0.03 IU/mL
QuantiFERON TB1 Ag Value: 0.03 IU/mL
QuantiFERON TB2 Ag Value: 0.05 IU/mL

## 2020-05-10 LAB — QUANTIFERON-TB GOLD PLUS: QuantiFERON-TB Gold Plus: NEGATIVE

## 2020-05-10 NOTE — Progress Notes (Signed)
TB gold negative

## 2020-05-16 DIAGNOSIS — D510 Vitamin B12 deficiency anemia due to intrinsic factor deficiency: Secondary | ICD-10-CM | POA: Diagnosis not present

## 2020-06-05 ENCOUNTER — Ambulatory Visit (HOSPITAL_COMMUNITY)
Admission: RE | Admit: 2020-06-05 | Discharge: 2020-06-05 | Disposition: A | Discharge status: Home / Self Care | Payer: Medicare Other | Source: Ambulatory Visit | Attending: Rheumatology | Admitting: Rheumatology

## 2020-06-05 ENCOUNTER — Other Ambulatory Visit: Payer: Self-pay

## 2020-06-05 DIAGNOSIS — M0579 Rheumatoid arthritis with rheumatoid factor of multiple sites without organ or systems involvement: Secondary | ICD-10-CM | POA: Diagnosis not present

## 2020-06-05 MED ORDER — DIPHENHYDRAMINE HCL 25 MG PO CAPS: 25.0000 mg | ORAL_CAPSULE | ORAL | Status: DC

## 2020-06-05 MED ORDER — ACETAMINOPHEN 325 MG PO TABS: 650.0000 mg | ORAL_TABLET | ORAL | Status: DC

## 2020-06-05 MED ORDER — TOCILIZUMAB 400 MG/20ML IV SOLN
4.0000 mg/kg | INTRAVENOUS | Status: DC
  Administered 2020-06-05: 484 mg via INTRAVENOUS
  Filled 2020-06-05: qty 20

## 2020-06-12 DIAGNOSIS — Z1231 Encounter for screening mammogram for malignant neoplasm of breast: Secondary | ICD-10-CM | POA: Diagnosis not present

## 2020-06-14 DIAGNOSIS — I10 Essential (primary) hypertension: Secondary | ICD-10-CM | POA: Diagnosis not present

## 2020-06-14 DIAGNOSIS — H52223 Regular astigmatism, bilateral: Secondary | ICD-10-CM | POA: Diagnosis not present

## 2020-06-14 DIAGNOSIS — H43813 Vitreous degeneration, bilateral: Secondary | ICD-10-CM | POA: Diagnosis not present

## 2020-06-14 DIAGNOSIS — H5203 Hypermetropia, bilateral: Secondary | ICD-10-CM | POA: Diagnosis not present

## 2020-06-14 DIAGNOSIS — H25813 Combined forms of age-related cataract, bilateral: Secondary | ICD-10-CM | POA: Diagnosis not present

## 2020-06-14 DIAGNOSIS — H43393 Other vitreous opacities, bilateral: Secondary | ICD-10-CM | POA: Diagnosis not present

## 2020-06-14 DIAGNOSIS — H524 Presbyopia: Secondary | ICD-10-CM | POA: Diagnosis not present

## 2020-06-19 ENCOUNTER — Telehealth: Payer: Self-pay | Admitting: Rheumatology

## 2020-06-19 DIAGNOSIS — E785 Hyperlipidemia, unspecified: Secondary | ICD-10-CM | POA: Diagnosis not present

## 2020-06-19 DIAGNOSIS — Z6841 Body Mass Index (BMI) 40.0 and over, adult: Secondary | ICD-10-CM | POA: Diagnosis not present

## 2020-06-19 DIAGNOSIS — I10 Essential (primary) hypertension: Secondary | ICD-10-CM | POA: Diagnosis not present

## 2020-06-19 DIAGNOSIS — R739 Hyperglycemia, unspecified: Secondary | ICD-10-CM | POA: Diagnosis not present

## 2020-06-19 DIAGNOSIS — Z792 Long term (current) use of antibiotics: Secondary | ICD-10-CM | POA: Diagnosis not present

## 2020-06-19 DIAGNOSIS — D510 Vitamin B12 deficiency anemia due to intrinsic factor deficiency: Secondary | ICD-10-CM | POA: Diagnosis not present

## 2020-06-19 NOTE — Telephone Encounter (Signed)
Ok to proceed with receiving the second covid-19 vaccine booster.  Please advise the patient to avoid tylenol and NSAIDs 24 hours prior to the vaccine.

## 2020-06-19 NOTE — Telephone Encounter (Signed)
Patient calling to see if, and when she needs to take 2nd booster? Patient had 1st booster in August 2021. Please call to advise.

## 2020-06-19 NOTE — Telephone Encounter (Signed)
I called patient, patient verbalized understanding. 

## 2020-06-22 DIAGNOSIS — Z23 Encounter for immunization: Secondary | ICD-10-CM | POA: Diagnosis not present

## 2020-07-03 ENCOUNTER — Other Ambulatory Visit: Payer: Self-pay

## 2020-07-03 ENCOUNTER — Ambulatory Visit (HOSPITAL_COMMUNITY)
Admission: RE | Admit: 2020-07-03 | Discharge: 2020-07-03 | Disposition: A | Discharge status: Home / Self Care | Payer: Medicare Other | Source: Ambulatory Visit | Attending: Rheumatology | Admitting: Rheumatology

## 2020-07-03 DIAGNOSIS — Z79899 Other long term (current) drug therapy: Secondary | ICD-10-CM | POA: Diagnosis not present

## 2020-07-03 DIAGNOSIS — M0579 Rheumatoid arthritis with rheumatoid factor of multiple sites without organ or systems involvement: Secondary | ICD-10-CM | POA: Insufficient documentation

## 2020-07-03 LAB — CBC WITH DIFFERENTIAL/PLATELET
Abs Immature Granulocytes: 0.06 10*3/uL (ref 0.00–0.07)
Basophils Absolute: 0 10*3/uL (ref 0.0–0.1)
Basophils Relative: 0 %
Eosinophils Absolute: 0.4 10*3/uL (ref 0.0–0.5)
Eosinophils Relative: 3 %
HCT: 39.8 % (ref 36.0–46.0)
Hemoglobin: 13.2 g/dL (ref 12.0–15.0)
Immature Granulocytes: 1 %
Lymphocytes Relative: 12 %
Lymphs Abs: 1.5 10*3/uL (ref 0.7–4.0)
MCH: 32.7 pg (ref 26.0–34.0)
MCHC: 33.2 g/dL (ref 30.0–36.0)
MCV: 98.5 fL (ref 80.0–100.0)
Monocytes Absolute: 0.7 10*3/uL (ref 0.1–1.0)
Monocytes Relative: 6 %
Neutro Abs: 9.7 10*3/uL — ABNORMAL HIGH (ref 1.7–7.7)
Neutrophils Relative %: 78 %
Platelets: 284 10*3/uL (ref 150–400)
RBC: 4.04 MIL/uL (ref 3.87–5.11)
RDW: 13.9 % (ref 11.5–15.5)
WBC: 12.5 10*3/uL — ABNORMAL HIGH (ref 4.0–10.5)
nRBC: 0 % (ref 0.0–0.2)

## 2020-07-03 LAB — COMPREHENSIVE METABOLIC PANEL
ALT: 20 U/L (ref 0–44)
AST: 19 U/L (ref 15–41)
Albumin: 3.6 g/dL (ref 3.5–5.0)
Alkaline Phosphatase: 67 U/L (ref 38–126)
Anion gap: 7 (ref 5–15)
BUN: 30 mg/dL — ABNORMAL HIGH (ref 8–23)
CO2: 24 mmol/L (ref 22–32)
Calcium: 9.2 mg/dL (ref 8.9–10.3)
Chloride: 105 mmol/L (ref 98–111)
Creatinine, Ser: 1.21 mg/dL — ABNORMAL HIGH (ref 0.44–1.00)
GFR, Estimated: 48 mL/min — ABNORMAL LOW (ref >60)
Glucose, Bld: 93 mg/dL (ref 70–99)
Potassium: 4.5 mmol/L (ref 3.5–5.1)
Sodium: 136 mmol/L (ref 135–145)
Total Bilirubin: 1.1 mg/dL (ref 0.3–1.2)
Total Protein: 6.6 g/dL (ref 6.5–8.1)

## 2020-07-03 MED ORDER — DIPHENHYDRAMINE HCL 25 MG PO CAPS: 25.0000 mg | ORAL_CAPSULE | ORAL | Status: DC

## 2020-07-03 MED ORDER — TOCILIZUMAB 400 MG/20ML IV SOLN
4.0000 mg/kg | INTRAVENOUS | Status: DC
  Administered 2020-07-03: 484 mg via INTRAVENOUS
  Filled 2020-07-03: qty 8

## 2020-07-03 MED ORDER — ACETAMINOPHEN 325 MG PO TABS: 650.0000 mg | ORAL_TABLET | ORAL | Status: DC

## 2020-07-14 ENCOUNTER — Other Ambulatory Visit: Payer: Self-pay | Admitting: Pharmacist

## 2020-07-14 DIAGNOSIS — Z79899 Other long term (current) drug therapy: Secondary | ICD-10-CM

## 2020-07-14 DIAGNOSIS — M0579 Rheumatoid arthritis with rheumatoid factor of multiple sites without organ or systems involvement: Secondary | ICD-10-CM

## 2020-07-14 NOTE — Progress Notes (Addendum)
Next Actemra infusion scheduled on 07/31/20 and patient is due for updated orders.  Dx: rheumatoid arthritis  Dose: Actemra 4mg /kg every 28 days  Last Clinic Visit: 03/01/20 Next Clinic Visit: 08/02/20  Last infusion: 07/03/20 Labs: CBC/CMP drawn 07/03/20 (GFR low but stable, WBC and abs neutrophils elevated but will continue to monitor)  TB Gold: negative on 05/08/20  Orders placed for Actemra 4 mg/kg every 28 days x 1 dose along with premedication of Tylenol and Benadryl to be administered 30 minutes before medication infusion. Will renew Actemra orders after her OV in case there are any changes.  Standing CBC with diff/platelet and CMP with GFR orders placed to be drawn every 2 months.  Next TB gold due March 2023.   Knox Saliva, PharmD, MPH Clinical Pharmacist (Rheumatology and Pulmonology)

## 2020-07-19 NOTE — Progress Notes (Signed)
Office Visit Note  Patient: [name] [name]             Date of Birth: 07/30/1950           MRN: [id number]             PCP: Lowella Dandy, NP Referring: Lowella Dandy, NP Visit Date: 08/02/2020 Occupation: @GUAROCC @  Subjective:  Other (Right hand pain and swelling- onset on Friday/Saturday. Actemra IV on 07/31/2020.)   History of Present Illness: [name] [name] is a 70 y.o. female history of seropositive rheumatoid arthritis and osteoarthritis.  She states she injured her left fifth finger.  For that reason she was using her right hand the whole time.  She was lifting heavy boxes.  She states she started having pain and swelling in her right hand about 5 days ago.  The swelling has not gone down.  She states the pain is moving into her right forearm.  She also has some discomfort in her left shoulder.  None of the other joints are painful.  She has been getting Actemra infusions on a regular basis.  Activities of Daily Living:  Patient reports morning stiffness for all day. Patient Reports nocturnal pain.  Difficulty dressing/grooming: Reports Difficulty climbing stairs: Reports Difficulty getting out of chair: Denies Difficulty using hands for taps, buttons, cutlery, and/or writing: Reports  Review of Systems  Constitutional: Negative for fatigue.  HENT: Positive for mouth dryness. Negative for mouth sores and nose dryness.   Eyes: Positive for dryness. Negative for pain and itching.  Respiratory: Positive for shortness of breath. Negative for difficulty breathing.   Cardiovascular: Negative for chest pain and palpitations.  Gastrointestinal: Negative for blood in stool, constipation and diarrhea.  Endocrine: Negative for increased urination.  Genitourinary: Negative for difficulty urinating.  Musculoskeletal: Positive for arthralgias, joint pain, joint swelling, myalgias, morning stiffness and myalgias. Negative for muscle tenderness.  Skin: Negative for color change and rash.   Allergic/Immunologic: Negative for susceptible to infections.  Neurological: Negative for dizziness, numbness, headaches, paresthesias, memory loss and weakness.  Hematological: Negative for bruising/bleeding tendency.  Psychiatric/Behavioral: Negative for confusion.    PMFS History:  Patient Active Problem List   Diagnosis Date Noted  . Dyspnea 11/05/2013  . Rheumatoid arthritis ([place])   . Hypertension   . GERD (gastroesophageal reflux disease)   . Morbid obesity ([place]) 03/10/2013  . S/P right THA, AA 03/09/2013    Past Medical History:  Diagnosis Date  . Deviated nasal septum    right side, cannot breathe out of right septum  . GERD (gastroesophageal reflux disease)   . Hypertension   . Plantar fasciitis   . Rheumatoid arthritis ([place])    oa and ra in arms, shane anderson  . UTI (lower urinary tract infection) 03/02/2013   treated with keflex for hip surgery on 03/09/13    Family History  Problem Relation Age of Onset  . Heart disease Father   . Kidney disease Father   . Heart attack Father   . Heart disease Brother   . Cancer Brother        agent orange   . Rheum arthritis Maternal Aunt   . Hypertension Mother   . Alzheimer's disease Mother   . Heart attack Brother   . Vascular Disease Brother   . Kidney disease Brother    Past Surgical History:  Procedure Laterality Date  . [place] BIOPSY  04/2020  . COLONOSCOPY  N/A 04/21/2015  . DIAGNOSTIC MAMMOGRAM Bilateral 04/05/2016  . ingrown toenail removed  5-6 yrs ago  . toenail removal Left   . TOTAL HIP ARTHROPLASTY Right 03/09/2013   Procedure: RIGHT TOTAL HIP ARTHROPLASTY ANTERIOR APPROACH;  Surgeon: Mauri Pole, MD;  Location: [facility];  Service: Orthopedics;  Laterality: Right;   Social History   Social History Narrative  . Not on file   Immunization History  Administered Date(s) Administered  . Influenza Split 12/26/2012  . Influenza-Unspecified 11/29/2013  .  Moderna Sars-Covid-2 Vaccination 04/01/2019, 04/27/2019, 10/12/2019  . Pneumococcal Polysaccharide-23 03/11/2013     Objective: Vital Signs: BP 134/78 (BP Location: Left Wrist, Patient Position: Sitting, Cuff Size: Normal)   Pulse 67   Resp 17   Ht $R'5\' 2"'hy$  (1.575 m)   Wt 269 lb 6.4 oz (122.2 kg)   BMI 49.27 kg/m    Physical Exam Vitals and nursing note reviewed.  Constitutional:      Appearance: She is well-developed.  HENT:     Head: Normocephalic and atraumatic.  Eyes:     Conjunctiva/sclera: Conjunctivae normal.  Cardiovascular:     Rate and Rhythm: Normal rate and regular rhythm.     Heart sounds: Normal heart sounds.  Pulmonary:     Effort: Pulmonary effort is normal.     Breath sounds: Normal breath sounds.  Abdominal:     General: Bowel sounds are normal.     Palpations: Abdomen is soft.  Musculoskeletal:     Cervical back: Normal range of motion.  Lymphadenopathy:     Cervical: No cervical adenopathy.  Skin:    General: Skin is warm and dry.     Capillary Refill: Capillary refill takes less than 2 seconds.  Neurological:     Mental Status: She is alert and oriented to person, place, and time.  Psychiatric:        Behavior: Behavior normal.      Musculoskeletal Exam: C-spine was in good range of motion.  She had discomfort range of motion of the left shoulder.  Elbow joints with good range of motion.  She has severe swelling over her MCPs and PIPs as described below.  Joints and knee joints with good range of motion.  She no tenderness over ankles or MTPs.  CDAI Exam: CDAI Score: 28.8  Patient Global: 9 mm; Provider Global: 9 mm Swollen: 13 ; Tender: 14  Joint Exam 08/02/2020      Right  Left  Glenohumeral      Tender  Wrist  Swollen Tender     MCP 1  Swollen Tender  Swollen Tender  MCP 2  Swollen Tender     MCP 3  Swollen Tender     MCP 4  Swollen Tender     MCP 5  Swollen Tender     IP  Swollen Tender  Swollen Tender  PIP 2  Swollen Tender     PIP  3  Swollen Tender     PIP 4  Swollen Tender     PIP 5  Swollen Tender        Investigation: No additional findings.  Imaging: No results found.  Recent Labs: Lab Results  Component Value Date   WBC 12.5 (H) 07/03/2020   HGB 13.2 07/03/2020   PLT 284 07/03/2020   NA 136 07/03/2020   K 4.5 07/03/2020   CL 105 07/03/2020   CO2 24 07/03/2020   GLUCOSE 93 07/03/2020   BUN 30 (H) 07/03/2020   CREATININE  1.21 (H) 07/03/2020   BILITOT 1.1 07/03/2020   ALKPHOS 67 07/03/2020   AST 19 07/03/2020   ALT 20 07/03/2020   PROT 6.6 07/03/2020   ALBUMIN 3.6 07/03/2020   CALCIUM 9.2 07/03/2020   GFRAA 48 (L) 10/28/2019   QFTBGOLDPLUS Negative 05/08/2020    Speciality Comments: No specialty comments available.  Procedures:  No procedures performed Allergies: Allopurinol, Asmanex (120 metered doses) [mometasone furoate], Guaifenesin & derivatives, Methotrexate derivatives, Methylprednisolone, Monosodium glutamate, Prednisone, Prevnar 13 [pneumococcal 13-val conj vacc], Shingrix [zoster vac recomb adjuvanted], Tetanus toxoids, Baclofen, Ciprofloxacin, Epiceram, Finasteride, Humira [adalimumab], Keratin, Leflunomide, Other, Polysporin [bacitracin-polymyxin b], Rosuvastatin, Vitamin e, Neosporin [neomycin-bacitracin zn-polymyx], and Sulfa antibiotics   Assessment / Plan:     Visit Diagnoses: Rheumatoid arthritis with rheumatoid factor of multiple sites without organ or systems involvement (HCC) - Dx 20 years ago, Followed by Dr. Dossie Der. 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 ESR 18, CRP<1: She is having a severe flare of rheumatoid arthritis.  She states she injured her left fifth finger and was using her right hand to do most activities.  She was also lifting some boxes and helping her sister.  She developed severe pain and swelling in her right hand.  She has been receiving Actemra infusions on a regular basis.  She states she was doing well prior to this flare.  We discussed possible use of prednisone taper.   She states she gets mild shortness of breath with high-dose prednisone.  She has taken prednisone without any problems and if she has any problems she usually takes Benadryl which helps.  I will call in a prescription for prednisone starting at 20 mg p.o. daily for 4 days and taper by 5 mg every 4 days.  High risk medication use - Actemra 400 mg/20 ml IV q4wk (started in 2016).  Discontinued Humira due to experiencing increased shortness of breath.  D/c MTX and Arava-SOB.  Her labs from Jul 03, 2020 were stable.  She has good GFR.  Her last TB Gold was on May 08, 2020.  I will order a lipid panel with her next labs with her infusion.  She was advised to get a fourth dose of COVID-19 vaccination (booster) 3 months after the third dose.  Instructions regarding other immunizations were also placed in the AVS which she can discuss with her PCP.  She was advised to stop the infusion in case she develops an infection and restart when the infection resolves.  Status post total hip replacement, right-doing well.  Primary osteoarthritis of both knees-she has chronic discomfort.  She is participating in some chair exercises which has been helpful.   Other medical problems are listed as follows:  History of gastroesophageal reflux (GERD)  History of hyperlipidemia-increased risk of heart disease with rheumatoid arthritis was discussed.  Dietary modifications and exercises were emphasized.  History of anemia-her hemoglobin was normal on Jul 03, 2020.  Essential hypertension-blood pressure is normal today.  Former smoker  Family history of rheumatoid arthritis  Orders: No orders of the defined types were placed in this encounter.  No orders of the defined types were placed in this encounter.     Follow-Up Instructions: Return in about 3 months (around 11/02/2020) for Rheumatoid arthritis.   Bo Merino, MD  Note - This record has been created using Editor, commissioning.  Chart creation errors  have been sought, but may not always  have been located. Such creation errors do not reflect on  the standard of medical care.

## 2020-07-20 DIAGNOSIS — D510 Vitamin B12 deficiency anemia due to intrinsic factor deficiency: Secondary | ICD-10-CM | POA: Diagnosis not present

## 2020-07-31 ENCOUNTER — Other Ambulatory Visit: Payer: Self-pay

## 2020-07-31 ENCOUNTER — Ambulatory Visit (HOSPITAL_COMMUNITY)
Admission: RE | Admit: 2020-07-31 | Discharge: 2020-07-31 | Disposition: A | Discharge status: Home / Self Care | Payer: Medicare Other | Source: Ambulatory Visit | Attending: Rheumatology | Admitting: Rheumatology

## 2020-07-31 DIAGNOSIS — M0579 Rheumatoid arthritis with rheumatoid factor of multiple sites without organ or systems involvement: Secondary | ICD-10-CM | POA: Insufficient documentation

## 2020-07-31 MED ORDER — DIPHENHYDRAMINE HCL 25 MG PO CAPS: 25.0000 mg | ORAL_CAPSULE | ORAL | Status: DC

## 2020-07-31 MED ORDER — SODIUM CHLORIDE 0.9 % IV SOLN
4.0000 mg/kg | INTRAVENOUS | Status: AC
  Administered 2020-07-31: 480 mg via INTRAVENOUS
  Filled 2020-07-31: qty 20

## 2020-07-31 MED ORDER — ACETAMINOPHEN 325 MG PO TABS: 650.0000 mg | ORAL_TABLET | ORAL | Status: DC

## 2020-08-02 ENCOUNTER — Encounter: Payer: Self-pay | Admitting: Rheumatology

## 2020-08-02 ENCOUNTER — Ambulatory Visit (INDEPENDENT_AMBULATORY_CARE_PROVIDER_SITE_OTHER): Payer: Medicare Other | Admitting: Rheumatology

## 2020-08-02 ENCOUNTER — Other Ambulatory Visit: Payer: Self-pay

## 2020-08-02 VITALS — BP 134/78 | HR 67 | Resp 17 | Ht 62.0 in | Wt 269.4 lb

## 2020-08-02 DIAGNOSIS — M170 Bilateral primary osteoarthritis of knee: Secondary | ICD-10-CM | POA: Diagnosis not present

## 2020-08-02 DIAGNOSIS — I10 Essential (primary) hypertension: Secondary | ICD-10-CM | POA: Diagnosis not present

## 2020-08-02 DIAGNOSIS — Z8261 Family history of arthritis: Secondary | ICD-10-CM

## 2020-08-02 DIAGNOSIS — Z8639 Personal history of other endocrine, nutritional and metabolic disease: Secondary | ICD-10-CM | POA: Diagnosis not present

## 2020-08-02 DIAGNOSIS — M0579 Rheumatoid arthritis with rheumatoid factor of multiple sites without organ or systems involvement: Secondary | ICD-10-CM | POA: Diagnosis not present

## 2020-08-02 DIAGNOSIS — Z87891 Personal history of nicotine dependence: Secondary | ICD-10-CM | POA: Diagnosis not present

## 2020-08-02 DIAGNOSIS — Z862 Personal history of diseases of the blood and blood-forming organs and certain disorders involving the immune mechanism: Secondary | ICD-10-CM | POA: Diagnosis not present

## 2020-08-02 DIAGNOSIS — Z79899 Other long term (current) drug therapy: Secondary | ICD-10-CM | POA: Diagnosis not present

## 2020-08-02 DIAGNOSIS — Z96641 Presence of right artificial hip joint: Secondary | ICD-10-CM

## 2020-08-02 DIAGNOSIS — Z8719 Personal history of other diseases of the digestive system: Secondary | ICD-10-CM | POA: Diagnosis not present

## 2020-08-02 DIAGNOSIS — R7611 Nonspecific reaction to tuberculin skin test without active tuberculosis: Secondary | ICD-10-CM

## 2020-08-02 MED ORDER — PREDNISONE 5 MG PO TABS
ORAL_TABLET | ORAL | 0 refills | Status: DC
Start: 2020-08-02 — End: 2020-08-21

## 2020-08-02 NOTE — Patient Instructions (Signed)
Vaccines You are taking a medication(s) that can suppress your immune system.  The following immunizations are recommended: . Flu annually . Covid-19  . Td/Tdap (tetanus, diphtheria, pertussis) every 10 years . Pneumonia (Prevnar 15 then Pneumovax 23 at least 1 year apart.  Alternatively, can take Prevnar 20 without needing additional dose) . Shingrix (after age 71): 2 doses from 4 weeks to 6 months apart  Please check with your PCP to make sure you are up to date.  If you test POSITIVE for COVID19 and have MILD to MODERATE symptoms: o First, call your PCP if you would like to receive COVID19 treatment AND o Hold your medications during the infection and for at least 1 week after your symptoms have resolved: - Injectable medication (Benlysta, Cimzia, Cosentyx, Enbrel, Humira, Orencia, Remicade, Simponi, Stelara, Taltz, Tremfya) - Methotrexate - Leflunomide (Arava) - Mycophenolate (Cellcept) - Morrie Sheldon, Olumiant, or Rinvoq o If you take Actemra or Kevzara, you DO NOT need to hold these for COVID19 infection.  If you test POSITIVE for COVID19 and have NO symptoms: o First, call your PCP if you would like to receive COVID19 treatment AND o Hold your medications for at least 10 days after the day that you tested positive - Injectable medication (Benlysta, Cimzia, Cosentyx, Enbrel, Humira, Orencia, Remicade, Simponi, Stelara, Taltz, Tremfya) - Methotrexate - Leflunomide (Arava) - Mycophenolate (Cellcept) - Morrie Sheldon, Olumiant, or Rinvoq o If you take Actemra or Kevzara, you DO NOT need to hold these for COVID19 infection. If you have signs or symptoms of an infection or start antibiotics: . First, call your PCP for workup of your infection. . Hold your medication through the infection, until you complete your antibiotics, and until symptoms resolve if you take the following: o Injectable medication (Actemra, Benlysta, Cimzia, Cosentyx, Enbrel, Humira, Kevzara, Orencia, Remicade, Simponi,  Stelara, Taltz, Tremfya) o Methotrexate o Leflunomide (Arava) o Mycophenolate (Cellcept) o Morrie Sheldon, Olumiant, or Rinvoq Heart Disease Prevention   Your inflammatory disease increases your risk of heart disease which includes heart attack, stroke, atrial fibrillation (irregular heartbeats), high blood pressure, heart failure and atherosclerosis (plaque in the arteries).  It is important to reduce your risk by:   . Keep blood pressure, cholesterol, and blood sugar at healthy levels   . Smoking Cessation   . Maintain a healthy weight  o BMI 20-25   . Eat a healthy diet  o Plenty of fresh fruit, vegetables, and whole grains  o Limit saturated fats, foods high in sodium, and added sugars  o DASH and Mediterranean diet   . Increase physical activity  o Recommend moderate physically activity for 150 minutes per week/ 30 minutes a day for five days a week These can be broken up into three separate ten-minute sessions during the day.   . Reduce Stress  . Meditation, slow breathing exercises, yoga, coloring books  . Dental visits twice a year

## 2020-08-03 ENCOUNTER — Other Ambulatory Visit: Payer: Self-pay | Admitting: Pharmacist

## 2020-08-03 ENCOUNTER — Telehealth: Payer: Self-pay

## 2020-08-03 DIAGNOSIS — M0579 Rheumatoid arthritis with rheumatoid factor of multiple sites without organ or systems involvement: Secondary | ICD-10-CM

## 2020-08-03 DIAGNOSIS — Z79899 Other long term (current) drug therapy: Secondary | ICD-10-CM

## 2020-08-03 NOTE — Telephone Encounter (Signed)
Spoke with patient and she states she had the Moderna vaccine on 06/22/2020. Patient advised it has been documented in her chart.

## 2020-08-03 NOTE — Progress Notes (Signed)
Next infusion scheduled for Actemra on 08/29/20 and due for updated orders. Diagnosis: rheumatoid arthritis  Dose: Actemra 4mg /kg every 28 days (dose of 488 mg pre dose based on last recorded weight off 122.2 kg)  Last Clinic Visit: 08/02/20 Next Clinic Visit: 10/31/20  Last infusion: 07/31/20 Labs: CBC and CMP on 07/03/20 are stable. ANC, platelets, and LFTs appropriate to continue Actemra. TB Gold: negative on 05/08/20   Orders placed for Actemra 4 mg/kg x 3 doses along with premedication of Tylenol and Benadryl to be administered 30 minutes before medication infusion.  Standing CBC with diff/platelet and CMP with GFR orders placed to be drawn every 2 months.  Lipid panel overdue. Will order with July 2022 infusion.  Next TB gold due March 2023  Knox Saliva, PharmD, MPH Clinical Pharmacist (Rheumatology and Pulmonology)

## 2020-08-03 NOTE — Telephone Encounter (Signed)
Patient called to let Dr. Estanislado Pandy know that she received her 4th Covid vaccine on 06/22/20.

## 2020-08-10 ENCOUNTER — Encounter: Payer: Self-pay | Admitting: Allergy and Immunology

## 2020-08-10 ENCOUNTER — Ambulatory Visit (INDEPENDENT_AMBULATORY_CARE_PROVIDER_SITE_OTHER): Payer: Medicare Other | Admitting: Allergy and Immunology

## 2020-08-10 ENCOUNTER — Other Ambulatory Visit: Payer: Self-pay

## 2020-08-10 VITALS — BP 142/70 | HR 63 | Temp 97.3°F | Resp 20 | Ht 61.5 in | Wt 272.0 lb

## 2020-08-10 DIAGNOSIS — J4540 Moderate persistent asthma, uncomplicated: Secondary | ICD-10-CM | POA: Diagnosis not present

## 2020-08-10 DIAGNOSIS — J3089 Other allergic rhinitis: Secondary | ICD-10-CM | POA: Diagnosis not present

## 2020-08-10 DIAGNOSIS — D849 Immunodeficiency, unspecified: Secondary | ICD-10-CM

## 2020-08-10 NOTE — Patient Instructions (Addendum)
  1.  Continue to Treat and prevent inflammation:   A.  Flonase 1 spray each nostril 3-7 times per week  B.  Arnuity 200 1 inhalation 3-7 times per week  2.  If needed:   A.  Pro-air HFA 1-2 puffs every 4-6 hours if needed  B.  OTC antihistamine  3.  Return to clinic in 12 months or earlier if problem

## 2020-08-10 NOTE — Progress Notes (Signed)
- [facility] [place]   Follow-up Note  Referring Provider: Lowella Dandy, NP Primary Provider: Lowella Dandy, NP Date of Office Visit: 08/10/2020  Subjective:   [name] [name] (DOB: 1950-10-24) is a 70 y.o. female who returns to the [facility] on 08/10/2020 in re-evaluation of the following:  HPI: [name] [name] returns to this clinic in evaluation of asthma and allergic rhinitis.  Her last visit to this clinic was 11 August 2019.  Overall she has really done very well since her last visit not requiring a systemic steroid or antibiotic for an airway issue and no need to use a short acting bronchodilator.  Her exercise capacity is somewhat limited by her musculoskeletal issue tied up with rheumatoid arthritis.  She continues to use Flonase and Arnuity about 3 times per week which is working quite well for her at this point.  She continues on immunosuppression for rheumatoid arthritis with Actemra and she recently required a systemic steroid for flareup of right hand inflammation.  She has received four Moderna COVID vaccines.  Allergies as of 08/10/2020       Reactions   Allopurinol Shortness Of Breath   Asmanex (120 Metered Doses) [mometasone Furoate] Shortness Of Breath   Guaifenesin & Derivatives Shortness Of Breath   MUCINEX   Methotrexate Derivatives Shortness Of Breath   Methylprednisolone Shortness Of Breath   Monosodium Glutamate Shortness Of Breath   Prednisone Shortness Of Breath   Prevnar 13 [pneumococcal 13-val Conj Vacc] Shortness Of Breath   Shingrix [zoster Vac Recomb Adjuvanted] Shortness Of Breath   Tetanus Toxoids Shortness Of Breath   Baclofen    Confusion, muscle weakness, forgetfulness, hallucinations, loss of appetite, headaches    Ciprofloxacin Other (See Comments)   Breathing and swallowing after two doses of medications   Epiceram    Finasteride    Humira [adalimumab]    SOB   Keratin    Keratin oil    Leflunomide    SOB   Other    AQUAPHOR   Polysporin [bacitracin-polymyxin B]    Rosuvastatin    Vitamin E    Vitamin e oil   Neosporin [neomycin-bacitracin Zn-polymyx] Rash   Sulfa Antibiotics Itching, Rash        Medication List      acetaminophen 500 MG tablet Commonly known as: TYLENOL Take 500 mg by mouth as needed.   ACTEMRA IV Inject into the vein every 28 (twenty-eight) days.   albuterol 108 (90 Base) MCG/ACT inhaler Commonly known as: VENTOLIN HFA Can inhale two puffs every four to six hours as needed for cough or wheeze.   Arnuity Ellipta 200 MCG/ACT Aepb Generic drug: Fluticasone Furoate Inhale 1 puff into the lungs 3-7 times per week.   ASPERCREME LIDOCAINE EX Apply topically.   atenolol 50 MG tablet Commonly known as: TENORMIN Take 50 mg by mouth every evening.   B-12 COMPLIANCE INJECTION IJ Inject 1 Dose as directed. 1 Injection 5 times a month   BIOFREEZE EX Apply topically.   BIOTIN PO Take 1,000 mg by mouth daily.   cephALEXin 500 MG capsule Commonly known as: KEFLEX Take 500 mg by mouth 4 (four) times daily. 1 hour prior to procedure   COLLAGEN PO Take 1,000 mg by mouth at bedtime.   diphenhydrAMINE 25 MG tablet Commonly known as: BENADRYL Take 25 mg by mouth every 6 (six) hours as needed for itching or allergies.   fluticasone 50 MCG/ACT nasal spray Commonly  known as: Flonase Use 1 spray in each nostril 3-7 times per week   GINGER PO Take 550 mg by mouth every morning.   Iron-Vitamin C 65-125 MG Tabs Take by mouth every morning.   Lecithin 1200 MG Caps Take 1,200 mg by mouth every morning.   levocetirizine 5 MG tablet Commonly known as: XYZAL Take 5 mg by mouth daily as needed for allergies.   lisinopril-hydrochlorothiazide 20-12.5 MG tablet Commonly known as: ZESTORETIC Take 1 tablet by mouth every morning.   MINOXIDIL EX Apply topically in the morning and at bedtime.   multivitamin with minerals Tabs tablet Take  1 tablet by mouth daily.   OVER THE COUNTER MEDICATION 3 (three) times daily as needed. Lubricating eye drops for dry eyes from BP meds   predniSONE 5 MG tablet Commonly known as: DELTASONE Take 4 tabs po qd x 4 days, 3  tabs po qd x 4 days, 2  tabs po qd x 4 days, 1  tab po qd x 4 days   Red Yeast Rice 600 MG Caps Take 600 mg by mouth 2 (two) times daily.   Turmeric Curcumin 500 MG Caps Take 1,000 mg by mouth 2 (two) times daily.   Vascepa 1 g capsule Generic drug: icosapent Ethyl 2 (TWO) CAPSULE TWO TIMES DAILY        Past Medical History:  Diagnosis Date   Deviated nasal septum    right side, cannot breathe out of right septum   GERD (gastroesophageal reflux disease)    Hypertension    Plantar fasciitis    Rheumatoid arthritis ([place])    oa and ra in arms, shane anderson   UTI (lower urinary tract infection) 03/02/2013   treated with keflex for hip surgery on 03/09/13    Past Surgical History:  Procedure Laterality Date   [place]  04/2020   COLONOSCOPY N/A 04/21/2015   DIAGNOSTIC MAMMOGRAM Bilateral 04/05/2016   ingrown toenail removed  5-6 yrs ago   toenail removal Left    TOTAL HIP ARTHROPLASTY Right 03/09/2013   Procedure: RIGHT TOTAL HIP ARTHROPLASTY ANTERIOR APPROACH;  Surgeon: Mauri Pole, MD;  Location: [facility];  Service: Orthopedics;  Laterality: Right;    Review of systems negative except as noted in HPI / PMHx or noted below:  Review of Systems  Constitutional: Negative.   HENT: Negative.    Eyes: Negative.   Respiratory: Negative.    Cardiovascular: Negative.   Gastrointestinal: Negative.   Genitourinary: Negative.   Musculoskeletal: Negative.   Skin: Negative.   Neurological: Negative.   Endo/Heme/Allergies: Negative.   Psychiatric/Behavioral: Negative.      Objective:   Vitals:   08/10/20 1127  BP: (!) 142/70  Pulse: 63  Resp: 20  Temp: (!) 97.3 F (36.3 C)  SpO2: 98%   Height: 5'  1.5" (156.2 cm)  Weight: 272 lb (123.4 kg)   Physical Exam Constitutional:      Appearance: She is not diaphoretic.  HENT:     Head: Normocephalic.     Right Ear: Tympanic membrane, ear canal and external ear normal.     Left Ear: Tympanic membrane, ear canal and external ear normal.     Nose: Nose normal. No mucosal edema or rhinorrhea.     Mouth/Throat:     Pharynx: Uvula midline. No oropharyngeal exudate.  Eyes:     Conjunctiva/sclera: Conjunctivae normal.  Neck:     Thyroid: No thyromegaly.  Trachea: Trachea normal. No tracheal tenderness or tracheal deviation.  Cardiovascular:     Rate and Rhythm: Normal rate and regular rhythm.     Heart sounds: Normal heart sounds, S1 normal and S2 normal. No murmur heard. Pulmonary:     Effort: No respiratory distress.     Breath sounds: Normal breath sounds. No stridor. No wheezing or rales.  Lymphadenopathy:     Head:     Right side of head: No tonsillar adenopathy.     Left side of head: No tonsillar adenopathy.     Cervical: No cervical adenopathy.  Skin:    Findings: No erythema or rash.     Nails: There is no clubbing.  Neurological:     Mental Status: She is alert.    Diagnostics:    Spirometry was performed and demonstrated an FEV1 of 1.40 at 68 % of predicted.  Assessment and Plan:   1. Asthma, moderate persistent, well-controlled   2. Perennial allergic rhinitis   3. Immunosuppression ([place])     1.  Continue to Treat and prevent inflammation:   A.  Flonase 1 spray each nostril 3-7 times per week  B.  Arnuity 200 1 inhalation 3-7 times per week  2.  If needed:   A.  Pro-air HFA 1-2 puffs every 4-6 hours if needed  B.  OTC antihistamine  3.  Return to clinic in 12 months or earlier if problem   [name] [name] appears to be doing very well regarding her airway issue on her current plan and we are not going to change the anti-inflammatory dosage for her airway utilizing Flonase and Arnuity on a pretty consistent basis  although relatively low dose.  Assuming she does well with this plan I will see her back in this clinic in 1 year or earlier if there is a problem.  Allena Katz, MD Allergy / Immunology [place]

## 2020-08-11 ENCOUNTER — Encounter: Payer: Self-pay | Admitting: Allergy and Immunology

## 2020-08-21 ENCOUNTER — Other Ambulatory Visit: Payer: Self-pay | Admitting: *Deleted

## 2020-08-21 DIAGNOSIS — D510 Vitamin B12 deficiency anemia due to intrinsic factor deficiency: Secondary | ICD-10-CM | POA: Diagnosis not present

## 2020-08-21 MED ORDER — PREDNISONE 5 MG PO TABS: ORAL_TABLET | ORAL | 0 refills | Status: DC

## 2020-08-21 NOTE — Telephone Encounter (Signed)
Patient advised prescription for prednisone has been sent to the pharmacy. Patient scheduled for an appointment on 08/31/2020 to talk about treatment options.

## 2020-08-21 NOTE — Addendum Note (Signed)
Addended by: Carole Binning on: 08/21/2020 01:03 PM   Modules accepted: Orders

## 2020-08-21 NOTE — Telephone Encounter (Signed)
Okay to send prednisone taper starting at 20 mg and taper by 5 mg every 4 days.  If patient has persistent swelling after the prednisone taper then she should schedule an appointment to discuss combination therapy or different treatment options.

## 2020-08-21 NOTE — Telephone Encounter (Signed)
Patient states she was recently Prednisone. Patient states she completed it on Friday. Patient states she is having swelling and pain in her right wrist. Patient states icing her wrist is not helping. Patient would like to know you you will give her some Prednisone. Patient states she was unable to sleep last night. Please advise.

## 2020-08-23 NOTE — Progress Notes (Signed)
Office Visit Note  Patient: [name] [name]             Date of Birth: 1950/07/06           MRN: [id number]             PCP: Lowella Dandy, NP Referring: Lowella Dandy, NP Visit Date: 08/31/2020 Occupation: @GUAROCC @  Subjective:  Pain in both hands   History of Present Illness: [name] [name] is a 70 y.o. female with history of seropositive rheumatoid arthritis and osteoarthritis.  Patient is on Actemra 4 mg/kg IV infusions every month.  Her most recent infusion was on 08/29/2020.  She was seen in the office on 08/02/2020 and was having pain and inflammation in multiple joints.  She was given a prednisone taper at that time which she completed but continued to have persistent pain and inflammation.  A new prednisone taper starting at 20 mg tapering by 5 mg every 4 days was sent to the pharmacy on 08/21/2020.  She is currently taking 10 mg of prednisone and is having some residual pain and swelling in her right hand and right wrist.  She is having difficulty making a fist and performing ADLs with her right hand at this time.  At night she has tried wearing her carpal tunnel brace but has not noticed much improvement in her symptoms.  She is also been applying ice topically as needed.  She is awaiting an appointment with nephrology. She denies any recent infections.     Activities of Daily Living:  Patient reports morning stiffness for 0 minutes.   Patient Reports nocturnal pain.  Difficulty dressing/grooming: Reports Difficulty climbing stairs: Reports Difficulty getting out of chair: Denies Difficulty using hands for taps, buttons, cutlery, and/or writing: Reports  Review of Systems  Constitutional:  Negative for fatigue.  HENT:  Negative for mouth sores, mouth dryness and nose dryness.   Eyes:  Positive for dryness. Negative for pain, itching and visual disturbance.  Respiratory:  Negative for cough, hemoptysis, shortness of breath and difficulty breathing.   Cardiovascular:  Positive for  swelling in legs/feet. Negative for chest pain and palpitations.  Gastrointestinal:  Negative for abdominal pain, blood in stool, constipation and diarrhea.  Endocrine: Negative for increased urination.  Genitourinary:  Negative for painful urination.  Musculoskeletal:  Positive for joint pain, joint pain, joint swelling and morning stiffness. Negative for myalgias, muscle weakness, muscle tenderness and myalgias.  Skin:  Negative for color change, rash and redness.  Allergic/Immunologic: Negative for susceptible to infections.  Neurological:  Negative for dizziness, numbness, headaches, memory loss and weakness.  Hematological:  Negative for swollen glands.  Psychiatric/Behavioral:  Positive for sleep disturbance. Negative for confusion.    PMFS History:  Patient Active Problem List   Diagnosis Date Noted   Dyspnea 11/05/2013   Rheumatoid arthritis ([place])    Hypertension    GERD (gastroesophageal reflux disease)    Morbid obesity ([place]) 03/10/2013   S/P right THA, AA 03/09/2013    Past Medical History:  Diagnosis Date   Deviated nasal septum    right side, cannot breathe out of right septum   GERD (gastroesophageal reflux disease)    Hypertension    Plantar fasciitis    Rheumatoid arthritis (HCC)    [facility] and [facility] [place]   UTI (lower urinary tract infection) 03/02/2013   treated with keflex for hip surgery on 03/09/13    Family History  Problem Relation Age of Onset   Heart  disease Father    Kidney disease Father    Heart attack Father    Heart disease Brother    Cancer Brother        agent orange    Rheum arthritis Maternal Aunt    Hypertension Mother    Alzheimer's disease Mother    Heart attack Brother    Vascular Disease Brother    Kidney disease Brother    Past Surgical History:  Procedure Laterality Date   BIOPSY BREAST Left    [facility]   COLON BIOPSY  04/2020   COLONOSCOPY N/A 04/21/2015   DIAGNOSTIC MAMMOGRAM Bilateral 04/05/2016    ingrown toenail removed  5-6 yrs ago   toenail removal Left    TOTAL HIP ARTHROPLASTY Right 03/09/2013   Procedure: RIGHT TOTAL HIP ARTHROPLASTY ANTERIOR APPROACH;  Surgeon: Mauri Pole, MD;  Location: [facility];  Service: Orthopedics;  Laterality: Right;   Social History   Social History Narrative   Not on file   Immunization History  Administered Date(s) Administered   Influenza Split 12/26/2012   Influenza-Unspecified 11/29/2013   Moderna Sars-Covid-2 Vaccination 04/01/2019, 04/27/2019, 10/12/2019, 06/22/2020   Pneumococcal Polysaccharide-23 03/11/2013   Zoster Recombinat (Shingrix) 07/08/2017, 12/30/2017     Objective: Vital Signs: BP 134/66 (BP Location: Left Arm, Patient Position: Sitting, Cuff Size: Normal)   Pulse 66   Ht 5' 1.5" (1.562 m)   Wt 273 lb 6.4 oz (124 kg)   BMI 50.82 kg/m    Physical Exam Vitals and nursing note reviewed.  Constitutional:      Appearance: She is well-developed.  HENT:     Head: Normocephalic and atraumatic.  Eyes:     Conjunctiva/sclera: Conjunctivae normal.  Pulmonary:     Effort: Pulmonary effort is normal.  Abdominal:     Palpations: Abdomen is soft.  Musculoskeletal:     Cervical back: Normal range of motion.  Skin:    General: Skin is warm and dry.     Capillary Refill: Capillary refill takes less than 2 seconds.  Neurological:     Mental Status: She is alert and oriented to person, place, and time.  Psychiatric:        Behavior: Behavior normal.     Musculoskeletal Exam: C-spine has good ROM with no discomfort. Shoulder joint abduction to about 120 degrees.  Elbow joints good ROM with no tenderness or joint swelling.  Limited flexion and extension of both wrist joints.  Tenderness and synovitis of the right wrist.  Tenderness and synovitis of the right 2nd and 3rd MCP joints and 3rd PIP joint.  Tenderness and synovitis of the left 1st PIP joint. Tenderness and synovitis of the right 5th MCP and PIP joint.  Right hip  replacement difficult to assess in seated position.  Knee joints have good ROM with no warmth or effusion at this time. Pedal edema noted bilaterally.   CDAI Exam: CDAI Score: 17.2  Patient Global: 6 mm; Provider Global: 6 mm Swollen: 8 ; Tender: 8  Joint Exam 08/31/2020      Right  Left  Wrist  Swollen Tender     MCP 1     Swollen Tender  MCP 2  Swollen Tender     MCP 3  Swollen Tender     MCP 5  Swollen Tender     IP     Swollen Tender  PIP 3  Swollen Tender     PIP 5  Swollen Tender        Investigation:  No additional findings.  Imaging: No results found.  Recent Labs: Lab Results  Component Value Date   WBC 17.1 (H) 08/29/2020   HGB 12.7 08/29/2020   PLT 278 08/29/2020   NA 136 08/29/2020   K 4.8 08/29/2020   CL 103 08/29/2020   CO2 26 08/29/2020   GLUCOSE 131 (H) 08/29/2020   BUN 30 (H) 08/29/2020   CREATININE 1.42 (H) 08/29/2020   BILITOT 1.0 08/29/2020   ALKPHOS 52 08/29/2020   AST 22 08/29/2020   ALT 19 08/29/2020   PROT 6.1 (L) 08/29/2020   ALBUMIN 3.3 (L) 08/29/2020   CALCIUM 9.1 08/29/2020   GFRAA 48 (L) 10/28/2019   QFTBGOLDPLUS Negative 05/08/2020    Speciality Comments: No specialty comments available.  Procedures:  No procedures performed Allergies: Allopurinol, Asmanex (120 metered doses) [mometasone furoate], Guaifenesin & derivatives, Methotrexate derivatives, Methylprednisolone, Monosodium glutamate, Prednisone, Prevnar 13 [pneumococcal 13-val conj vacc], Shingrix [zoster vac recomb adjuvanted], Tetanus toxoids, Baclofen, Ciprofloxacin, Epiceram, Finasteride, Humira [adalimumab], Keratin, Leflunomide, Other, Polysporin [bacitracin-polymyxin b], Rosuvastatin, Vitamin e, Neosporin [neomycin-bacitracin zn-polymyx], and Sulfa antibiotics   Assessment / Plan:     Visit Diagnoses: Rheumatoid arthritis with rheumatoid factor of multiple sites without organ or systems involvement (HCC) - Dx 20 years ago, Followed by Dr. Dossie Der. U/x 2015-destructive  RA. ESR 18, CRP<1: She presents today with ongoing pain and synovitis in her right wrist and several MCP and PIP joints as described above.  She received her actemra 4 mg/kg IV infusion on 08/29/20, but she has not noticed any improvement in her joint pain and inflammation. She was started on a prednisone taper on 08/02/20 and a new taper on 08/21/20 to help manage the flare.  She is currently on 10 mg and has persistent synovitis was noted above.  Different treatment options were discussed today in detail.  We discussed that ideally we would like to increase the dose of Actemra to 8 mg/kg every month but she will require clearance from nephrology prior to [place] increasing the dose.  Referral to nephrology was placed for further evaluation of elevated creatinine and low GFR.  In the meantime we will start her on Plaquenil 200 mg 1 tablet by mouth daily.  Indications, contraindications, potential side effects of Plaquenil were discussed in detail.  All questions were addressed and consent was obtained today.  We discussed that Plaquenil has a very small amount of sulfa which can lead to some cross-reactivity in patients with sulfa allergy.  She is willing to try Plaquenil but was advised to notify us if she cannot tolerate it or develops an allergic reaction.  She has been taking Xyzal on a daily basis and has Benadryl at home if she develops a rash.  We we will wait on approval regarding the Actemra dose after she has had the consultation with nephrology.  She will continue on a prednisone taper starting at 10 mg tapering by 2.5 mg every week.  A prescription for Plaquenil and prednisone were sent to the pharmacy today.  She will follow up in 2-3 months.   Patient was counseled on the purpose, proper use, and adverse effects of hydroxychloroquine including nausea/diarrhea, skin rash, headaches, and sun sensitivity.  Discussed importance of annual eye exams while on hydroxychloroquine to monitor to ocular toxicity and  discussed importance of frequent laboratory monitoring.  Provided patient with eye exam form for baseline ophthalmologic exam.  Provided patient with educational materials on hydroxychloroquine and answered all questions.  Patient consented to hydroxychloroquine.  Will  upload consent in the media tab.    Dose will be Plaquenil 200 mg once daily.  Prescription pending lab results.  High risk medication use - Actemra 4 mg/kg IV infusions q4wk (started in 2016).  Adding on plaquenil 200 mg 1 tablet daily. Discontinued Humira due to experiencing increased shortness of breath.  D/c MTX and Arava-SOB. She is not a good candidate for SSZ due to sulfa allergy.   According to the patient she tolerates prednisone and checked with her allergist prior to taking the last 2 tapers. TB gold negative 05/08/20.  CBC and CMP were updated with her infusion on 08/29/2020.  Creatinine continues to trend up and GFR was 40.  She has not been taking any NSAIDs.  A referral to nephrology was placed but there are no nephrologist in [place] so a new referral will be placed for [facility] in [place]. She has not had any recent infections.  Discussed the importance of postponing Actemra infections if she develops signs or symptoms of an infection and to resume once infection has completely cleared.  Status post total hip replacement, right: Doing well.  Difficult to assess range of motion while in seated position.  She has been using a cane to assist with ambulation.  Primary osteoarthritis of both knees: She has good range of motion of both knee joints on examination.  She experiences stiffness after sitting for prolonged periods of time.  She has been using a cane to assist with ambulation.  No warmth or effusion of her knee joints was noted on exam today.  Other medical conditions are listed as follows:   History of gastroesophageal reflux (GERD)  History of hyperlipidemia: lipid panel WNL on 08/29/20.   Essential  hypertension: BP was 134/66 today in the office.   History of anemia - RBC count borderline low-3.85 and hemoglobin WNL on 08/29/20.   Former smoker  Family history of rheumatoid arthritis  Orders: No orders of the defined types were placed in this encounter.  Meds ordered this encounter  Medications   predniSONE (DELTASONE) 5 MG tablet    Sig: Take 2 tablets by mouth daily x1wk, 1.5 tablets by mouth daily x1wk, 1 tablet daily x1wk, half tablet daily x1wk.    Dispense:  35 tablet    Refill:  0   hydroxychloroquine (PLAQUENIL) 200 MG tablet    Sig: Take 1 tablet (200 mg total) by mouth daily.    Dispense:  90 tablet    Refill:  0      Follow-Up Instructions: Return in about 3 months (around 12/01/2020) for Rheumatoid arthritis, Osteoarthritis.   Ofilia Neas, PA-C  Note - This record has been created using Dragon software.  Chart creation errors have been sought, but may not always  have been located. Such creation errors do not reflect on  the standard of medical care.

## 2020-08-29 ENCOUNTER — Telehealth: Payer: Self-pay | Admitting: *Deleted

## 2020-08-29 ENCOUNTER — Other Ambulatory Visit: Payer: Self-pay

## 2020-08-29 ENCOUNTER — Ambulatory Visit (HOSPITAL_COMMUNITY)
Admission: RE | Admit: 2020-08-29 | Discharge: 2020-08-29 | Disposition: A | Discharge status: Home / Self Care | Payer: Medicare Other | Source: Ambulatory Visit | Attending: Rheumatology | Admitting: Rheumatology

## 2020-08-29 DIAGNOSIS — R944 Abnormal results of kidney function studies: Secondary | ICD-10-CM

## 2020-08-29 DIAGNOSIS — M0579 Rheumatoid arthritis with rheumatoid factor of multiple sites without organ or systems involvement: Secondary | ICD-10-CM | POA: Diagnosis not present

## 2020-08-29 DIAGNOSIS — Z79899 Other long term (current) drug therapy: Secondary | ICD-10-CM | POA: Diagnosis not present

## 2020-08-29 DIAGNOSIS — R7989 Other specified abnormal findings of blood chemistry: Secondary | ICD-10-CM

## 2020-08-29 LAB — COMPREHENSIVE METABOLIC PANEL
ALT: 19 U/L (ref 0–44)
AST: 22 U/L (ref 15–41)
Albumin: 3.3 g/dL — ABNORMAL LOW (ref 3.5–5.0)
Alkaline Phosphatase: 52 U/L (ref 38–126)
Anion gap: 7 (ref 5–15)
BUN: 30 mg/dL — ABNORMAL HIGH (ref 8–23)
CO2: 26 mmol/L (ref 22–32)
Calcium: 9.1 mg/dL (ref 8.9–10.3)
Chloride: 103 mmol/L (ref 98–111)
Creatinine, Ser: 1.42 mg/dL — ABNORMAL HIGH (ref 0.44–1.00)
GFR, Estimated: 40 mL/min — ABNORMAL LOW (ref >60)
Glucose, Bld: 131 mg/dL — ABNORMAL HIGH (ref 70–99)
Potassium: 4.8 mmol/L (ref 3.5–5.1)
Sodium: 136 mmol/L (ref 135–145)
Total Bilirubin: 1 mg/dL (ref 0.3–1.2)
Total Protein: 6.1 g/dL — ABNORMAL LOW (ref 6.5–8.1)

## 2020-08-29 LAB — CBC WITH DIFFERENTIAL/PLATELET
Abs Immature Granulocytes: 0.16 10*3/uL — ABNORMAL HIGH (ref 0.00–0.07)
Basophils Absolute: 0.1 10*3/uL (ref 0.0–0.1)
Basophils Relative: 0 %
Eosinophils Absolute: 0.2 10*3/uL (ref 0.0–0.5)
Eosinophils Relative: 1 %
HCT: 39 % (ref 36.0–46.0)
Hemoglobin: 12.7 g/dL (ref 12.0–15.0)
Immature Granulocytes: 1 %
Lymphocytes Relative: 9 %
Lymphs Abs: 1.5 10*3/uL (ref 0.7–4.0)
MCH: 33 pg (ref 26.0–34.0)
MCHC: 32.6 g/dL (ref 30.0–36.0)
MCV: 101.3 fL — ABNORMAL HIGH (ref 80.0–100.0)
Monocytes Absolute: 0.7 10*3/uL (ref 0.1–1.0)
Monocytes Relative: 4 %
Neutro Abs: 14.5 10*3/uL — ABNORMAL HIGH (ref 1.7–7.7)
Neutrophils Relative %: 85 %
Platelets: 278 10*3/uL (ref 150–400)
RBC: 3.85 MIL/uL — ABNORMAL LOW (ref 3.87–5.11)
RDW: 13.9 % (ref 11.5–15.5)
WBC: 17.1 10*3/uL — ABNORMAL HIGH (ref 4.0–10.5)
nRBC: 0 % (ref 0.0–0.2)

## 2020-08-29 LAB — LIPID PANEL
Cholesterol: 179 mg/dL (ref 0–200)
HDL: 53 mg/dL (ref >40)
LDL Cholesterol: 97 mg/dL (ref 0–99)
Total CHOL/HDL Ratio: 3.4 RATIO
Triglycerides: 144 mg/dL (ref <150)
VLDL: 29 mg/dL (ref 0–40)

## 2020-08-29 MED ORDER — TOCILIZUMAB 400 MG/20ML IV SOLN
4.0000 mg/kg | INTRAVENOUS | Status: DC
  Administered 2020-08-29: 488 mg via INTRAVENOUS
  Filled 2020-08-29: qty 20.4

## 2020-08-29 NOTE — Telephone Encounter (Signed)
-----   Message from Ofilia Neas, PA-C sent at 08/29/2020  4:30 PM EDT ----- Reviewed lab work with Dr. Estanislado Pandy. Lipid panel WNL.  Creatinine is elevated and has trended up.  GFR is low-40.  Please advise the patient to avoid NSAIDs.  Please clarify if the patient has a nephrologist.  If she does not have a nephrologist ple ase place a referral per recommendation made by Dr. Estanislado Pandy.    WBC count is elevated. Likely due to currently being on a prednisone taper.

## 2020-08-29 NOTE — Progress Notes (Signed)
Reviewed lab work with Dr. Estanislado Pandy. Lipid panel WNL.  Creatinine is elevated and has trended up.  GFR is low-40.  Please advise the patient to avoid NSAIDs.  Please clarify if the patient has a nephrologist.  If she does not have a nephrologist please place a referral per recommendation made by Dr. Estanislado Pandy.    WBC count is elevated. Likely due to currently being on a prednisone taper.

## 2020-08-31 ENCOUNTER — Ambulatory Visit (INDEPENDENT_AMBULATORY_CARE_PROVIDER_SITE_OTHER): Payer: Medicare Other | Admitting: Physician Assistant

## 2020-08-31 ENCOUNTER — Other Ambulatory Visit: Payer: Self-pay

## 2020-08-31 ENCOUNTER — Encounter: Payer: Self-pay | Admitting: Physician Assistant

## 2020-08-31 VITALS — BP 134/66 | HR 66 | Ht 61.5 in | Wt 273.4 lb

## 2020-08-31 DIAGNOSIS — Z87891 Personal history of nicotine dependence: Secondary | ICD-10-CM | POA: Diagnosis not present

## 2020-08-31 DIAGNOSIS — Z862 Personal history of diseases of the blood and blood-forming organs and certain disorders involving the immune mechanism: Secondary | ICD-10-CM

## 2020-08-31 DIAGNOSIS — M170 Bilateral primary osteoarthritis of knee: Secondary | ICD-10-CM | POA: Diagnosis not present

## 2020-08-31 DIAGNOSIS — M0579 Rheumatoid arthritis with rheumatoid factor of multiple sites without organ or systems involvement: Secondary | ICD-10-CM

## 2020-08-31 DIAGNOSIS — Z8719 Personal history of other diseases of the digestive system: Secondary | ICD-10-CM

## 2020-08-31 DIAGNOSIS — Z8261 Family history of arthritis: Secondary | ICD-10-CM

## 2020-08-31 DIAGNOSIS — I10 Essential (primary) hypertension: Secondary | ICD-10-CM

## 2020-08-31 DIAGNOSIS — Z79899 Other long term (current) drug therapy: Secondary | ICD-10-CM | POA: Diagnosis not present

## 2020-08-31 DIAGNOSIS — Z96641 Presence of right artificial hip joint: Secondary | ICD-10-CM

## 2020-08-31 DIAGNOSIS — Z8639 Personal history of other endocrine, nutritional and metabolic disease: Secondary | ICD-10-CM | POA: Diagnosis not present

## 2020-08-31 MED ORDER — PREDNISONE 5 MG PO TABS: ORAL_TABLET | ORAL | 0 refills | Status: DC

## 2020-08-31 MED ORDER — HYDROXYCHLOROQUINE SULFATE 200 MG PO TABS: 200.0000 mg | ORAL_TABLET | Freq: Every day | ORAL | 0 refills | Status: DC

## 2020-09-12 ENCOUNTER — Telehealth: Payer: Self-pay

## 2020-09-12 NOTE — Telephone Encounter (Signed)
Patient advised to avoid restarting PLQ due to experiencing shortness of breath after 2 consecutive doses.  Patient advised ideally we would like to increase her dose of actemra to 8mg /kg IV infusions every 4 weeks, but we would like her nephrologists opinion prior to changing the dose.    Patient states she has not seen the nephrologist yet. Patient states she has not been contacted to schedule an appointment.

## 2020-09-12 NOTE — Telephone Encounter (Signed)
I called patient, referral received by [facility], patient will call to schedule appt.

## 2020-09-12 NOTE — Telephone Encounter (Signed)
Patient called stating she began taking the Hydroxychloroquine on Monday, 09/04/20 and on Saturday, 09/09/20 began experiencing shortness of breath.  Patient is not sure if her reaction is due to taking the medication with Prednisone.  Patient states she plans to stop taking the Hydroxychloroquine until she finishes her Prednisone which should be in 1 month.

## 2020-09-12 NOTE — Telephone Encounter (Signed)
Patient states she experienced some shortness of breath on Friday and then on Saturday after taking PLQ. Patient states she was not "gasping for air". Patient states she has not taken the PLQ since Saturday morning. Patient denies any shortness of breath at this time. Patient states she is going to complete the prednisone and then try the PLQ again.

## 2020-09-15 ENCOUNTER — Telehealth: Payer: Self-pay | Admitting: Rheumatology

## 2020-09-15 NOTE — Telephone Encounter (Signed)
Per patient right hand is swelling again. Patient is still on Prednisone. Started last rx this past Thursday 7/14. Still waiting on Nephrologist appointment. Please call to advise.

## 2020-09-15 NOTE — Telephone Encounter (Signed)
Discussed with Dr. Estanislado Pandy.  Ok to increase prednisone back up to 10 mg and to continue on that dose until seeing the nephrologist.  Please check on status of referral.  We would like her to be seen ASAP to get their opinion about increasing the dose of actemra.

## 2020-09-15 NOTE — Telephone Encounter (Signed)
Patient states her hand started swelling again. Patient states it started swelling again last night. Patient states it is the third and fifth fingers on her right hand. She is right hand dominant. Patient states she has not been scheduled with the nephrologist yet. They do have referral and will call to schedule patient. Patient is currently on prednisone 7.5 mg. Patient states she has been on that dose since yesterday. Patient states her hand was fine when she was on Prednisone 10 mg. Please advise.

## 2020-09-15 NOTE — Telephone Encounter (Signed)
Patient advised ok to increase prednisone back up to 10 mg and to continue on that dose until seeing the nephrologist.  Jinny Blossom called to check on status of referral.  Megan left a message, explaining we would like her to be seen ASAP to get their opinion about increasing the dose of actemra. Will call back on Monday.

## 2020-09-18 ENCOUNTER — Telehealth: Payer: Self-pay

## 2020-09-18 DIAGNOSIS — Z792 Long term (current) use of antibiotics: Secondary | ICD-10-CM | POA: Diagnosis not present

## 2020-09-18 DIAGNOSIS — D510 Vitamin B12 deficiency anemia due to intrinsic factor deficiency: Secondary | ICD-10-CM | POA: Diagnosis not present

## 2020-09-18 DIAGNOSIS — I10 Essential (primary) hypertension: Secondary | ICD-10-CM | POA: Diagnosis not present

## 2020-09-18 DIAGNOSIS — M069 Rheumatoid arthritis, unspecified: Secondary | ICD-10-CM | POA: Diagnosis not present

## 2020-09-18 DIAGNOSIS — N182 Chronic kidney disease, stage 2 (mild): Secondary | ICD-10-CM | POA: Diagnosis not present

## 2020-09-18 DIAGNOSIS — E785 Hyperlipidemia, unspecified: Secondary | ICD-10-CM | POA: Diagnosis not present

## 2020-09-18 DIAGNOSIS — R739 Hyperglycemia, unspecified: Secondary | ICD-10-CM | POA: Diagnosis not present

## 2020-09-18 DIAGNOSIS — Z6841 Body Mass Index (BMI) 40.0 and over, adult: Secondary | ICD-10-CM | POA: Diagnosis not present

## 2020-09-18 NOTE — Telephone Encounter (Signed)
Noted  

## 2020-09-18 NOTE — Telephone Encounter (Signed)
FYI:  Patient called to let Dr. Estanislado Pandy know that she scheduled an appointment with Dr. Hollie Salk at [facility] for Wednesday, 09/20/20.

## 2020-09-18 NOTE — Telephone Encounter (Signed)
Megan spoke with Dawn at [facility], they have an opening for 09/20/20 and will contact patient to schedule.

## 2020-09-20 DIAGNOSIS — I129 Hypertensive chronic kidney disease with stage 1 through stage 4 chronic kidney disease, or unspecified chronic kidney disease: Secondary | ICD-10-CM | POA: Diagnosis not present

## 2020-09-20 DIAGNOSIS — N1832 Chronic kidney disease, stage 3b: Secondary | ICD-10-CM | POA: Diagnosis not present

## 2020-09-20 DIAGNOSIS — N1831 Chronic kidney disease, stage 3a: Secondary | ICD-10-CM | POA: Diagnosis not present

## 2020-09-20 DIAGNOSIS — E785 Hyperlipidemia, unspecified: Secondary | ICD-10-CM | POA: Diagnosis not present

## 2020-09-20 DIAGNOSIS — M059 Rheumatoid arthritis with rheumatoid factor, unspecified: Secondary | ICD-10-CM | POA: Diagnosis not present

## 2020-09-25 ENCOUNTER — Telehealth: Payer: Self-pay

## 2020-09-25 ENCOUNTER — Other Ambulatory Visit: Payer: Self-pay | Admitting: Physician Assistant

## 2020-09-25 DIAGNOSIS — Z79899 Other long term (current) drug therapy: Secondary | ICD-10-CM

## 2020-09-25 DIAGNOSIS — M0579 Rheumatoid arthritis with rheumatoid factor of multiple sites without organ or systems involvement: Secondary | ICD-10-CM

## 2020-09-25 NOTE — Telephone Encounter (Signed)
New order set placed for Actemra 8 mg/kg IV infusions every 4 weeks.

## 2020-09-25 NOTE — Telephone Encounter (Signed)
Received fax from [facility] and it was reviewed by [name] Sams, PA-C.  [name] [name] noted, "ok to increase Actemra to '8mg'$ /kg every 4 weeks".   I called patient to advise and patient goes tomorrow, 09/26/2020 for next infusion. [name] [name], is currently out of the office, will send to [name] Sams, PA-C to place orders.   Called patient again to advise that [name] [name] will be placing the orders for Actemra '8mg'$ /kg every 4 weeks and [facility] will have new order for tomorrows infusion. Patient verbalized understanding.

## 2020-09-25 NOTE — Progress Notes (Signed)
Next infusion scheduled for Actemra 8 mg/kg IV on 09/26/20.  Diagnosis: RA Received clearance from Dr. Hollie Salk at [facility] to proceed with increased dose of actemra 8 mg/kg IV infusions every 4 weeks from a renal standpoint.  New orders placed today for her next infusion scheduled tomorrow.   Dose: 8 mg/kg every 4 weeks   Last Clinic Visit: 08/31/20 Next Clinic Visit: 12/05/20  Last infusion: 08/29/20 (on 72m/kg q4 weeks dose)  Labs: 08/29/20 CBC and CMP updated. Creatinine 1.42 and eGFR 40% according to Dr. UBishop Dublinoffice visit note.  TB Gold: 05/08/20  Orders placed for Actemra 8 mg/kg IV infusion x 3 doses along with premedication of Tylenol and Benadryl to be administered 30 minutes before medication infusion.  Standing CBC with diff/platelet and CMP with GFR orders placed to be drawn every 2 months.     THazel Sams PA-C

## 2020-09-26 ENCOUNTER — Other Ambulatory Visit: Payer: Self-pay

## 2020-09-26 ENCOUNTER — Encounter (HOSPITAL_COMMUNITY)
Admission: RE | Admit: 2020-09-26 | Discharge: 2020-09-26 | Disposition: A | Discharge status: Home / Self Care | Payer: Medicare Other | Source: Ambulatory Visit | Attending: Rheumatology | Admitting: Rheumatology

## 2020-09-26 DIAGNOSIS — M0579 Rheumatoid arthritis with rheumatoid factor of multiple sites without organ or systems involvement: Secondary | ICD-10-CM | POA: Insufficient documentation

## 2020-09-26 MED ORDER — DIPHENHYDRAMINE HCL 25 MG PO CAPS: 25.0000 mg | ORAL_CAPSULE | Freq: Once | ORAL | Status: DC

## 2020-09-26 MED ORDER — SODIUM CHLORIDE 0.9 % IV SOLN
8.0000 mg/kg | INTRAVENOUS | Status: DC
  Administered 2020-09-26: 992 mg via INTRAVENOUS
  Filled 2020-09-26: qty 40

## 2020-09-26 MED ORDER — ACETAMINOPHEN 325 MG PO TABS: 650.0000 mg | ORAL_TABLET | Freq: Once | ORAL | Status: DC

## 2020-10-23 ENCOUNTER — Telehealth: Payer: Self-pay

## 2020-10-23 ENCOUNTER — Other Ambulatory Visit (HOSPITAL_COMMUNITY): Payer: Self-pay | Admitting: *Deleted

## 2020-10-23 NOTE — Telephone Encounter (Signed)
Patient called stating her right knee is swollen and she can barely walk.  Patient requested a return call with recommendations on what she can take.  Patient states she cannot take NSAIDs.  Patient states she is also scheduled for her Actemra infusion tomorrow, 10/24/20.

## 2020-10-23 NOTE — Telephone Encounter (Signed)
She may benefit from getting cortisone injection to her knee joint.  If she cannot come in for an appointment then you may send a prednisone taper starting at prednisone 5 mg tablet, starting at 20 mg for 2 days, 10 for 2 days, 15 for 2 days and 5 for 2 days.

## 2020-10-23 NOTE — Telephone Encounter (Signed)
I called patient, appt scheduled 10/24/2020. Per Dr. Estanislado Pandy, prednisone RX was not sent since patient will be seen for OV.

## 2020-10-23 NOTE — Telephone Encounter (Signed)
Patient called stating her right knee is swollen and leg. Patient states she can barely walk.  Patient states she is using a walker to get around. Patient would like to know what you recommend for what she can take or do to relieve the pain.  Patient states she cannot take NSAIDs.  Patient states she is also scheduled for her Actemra infusion tomorrow, 10/24/20 and it will be the second infusion of the increased dose. Patient uses [facility] in [place]. Please advise.

## 2020-10-24 ENCOUNTER — Ambulatory Visit (INDEPENDENT_AMBULATORY_CARE_PROVIDER_SITE_OTHER): Payer: Medicare Other | Admitting: Physician Assistant

## 2020-10-24 ENCOUNTER — Other Ambulatory Visit: Payer: Self-pay

## 2020-10-24 ENCOUNTER — Encounter (HOSPITAL_COMMUNITY)
Admission: RE | Admit: 2020-10-24 | Discharge: 2020-10-24 | Disposition: A | Discharge status: Home / Self Care | Payer: Medicare Other | Source: Ambulatory Visit | Attending: Rheumatology | Admitting: Rheumatology

## 2020-10-24 ENCOUNTER — Encounter: Payer: Self-pay | Admitting: Physician Assistant

## 2020-10-24 VITALS — BP 125/68 | HR 70 | Resp 18 | Ht 61.5 in | Wt 278.0 lb

## 2020-10-24 DIAGNOSIS — Z8261 Family history of arthritis: Secondary | ICD-10-CM | POA: Diagnosis not present

## 2020-10-24 DIAGNOSIS — Z96641 Presence of right artificial hip joint: Secondary | ICD-10-CM

## 2020-10-24 DIAGNOSIS — M1711 Unilateral primary osteoarthritis, right knee: Secondary | ICD-10-CM | POA: Diagnosis not present

## 2020-10-24 DIAGNOSIS — Z79899 Other long term (current) drug therapy: Secondary | ICD-10-CM | POA: Diagnosis not present

## 2020-10-24 DIAGNOSIS — Z862 Personal history of diseases of the blood and blood-forming organs and certain disorders involving the immune mechanism: Secondary | ICD-10-CM

## 2020-10-24 DIAGNOSIS — Z8639 Personal history of other endocrine, nutritional and metabolic disease: Secondary | ICD-10-CM

## 2020-10-24 DIAGNOSIS — Z8719 Personal history of other diseases of the digestive system: Secondary | ICD-10-CM

## 2020-10-24 DIAGNOSIS — M170 Bilateral primary osteoarthritis of knee: Secondary | ICD-10-CM

## 2020-10-24 DIAGNOSIS — M0579 Rheumatoid arthritis with rheumatoid factor of multiple sites without organ or systems involvement: Secondary | ICD-10-CM

## 2020-10-24 DIAGNOSIS — Z87891 Personal history of nicotine dependence: Secondary | ICD-10-CM

## 2020-10-24 DIAGNOSIS — I10 Essential (primary) hypertension: Secondary | ICD-10-CM

## 2020-10-24 MED ORDER — TRIAMCINOLONE ACETONIDE 40 MG/ML IJ SUSP
40.0000 mg | INTRAMUSCULAR | Status: AC | PRN
  Administered 2020-10-24: 40 mg via INTRA_ARTICULAR

## 2020-10-24 MED ORDER — ACETAMINOPHEN 325 MG PO TABS: 650.0000 mg | ORAL_TABLET | Freq: Once | ORAL | Status: DC

## 2020-10-24 MED ORDER — LIDOCAINE HCL 1 % IJ SOLN
1.5000 mL | INTRAMUSCULAR | Status: AC | PRN
  Administered 2020-10-24: 1.5 mL

## 2020-10-24 MED ORDER — SODIUM CHLORIDE 0.9 % IV SOLN
8.0000 mg/kg | INTRAVENOUS | Status: DC
  Administered 2020-10-24: 992 mg via INTRAVENOUS
  Filled 2020-10-24: qty 40

## 2020-10-24 MED ORDER — DIPHENHYDRAMINE HCL 25 MG PO CAPS: 25.0000 mg | ORAL_CAPSULE | Freq: Once | ORAL | Status: DC

## 2020-10-24 NOTE — Progress Notes (Signed)
 Office Visit Note  Patient: [name] [name]             Date of Birth: 04/29/1950           MRN: [id number]             PCP: Moon, Amy A, NP Referring: Moon, Amy A, NP Visit Date: 10/24/2020 Occupation: @GUAROCC@  Subjective:  Right knee joint pain   History of Present Illness: [name] [name] is a 70 y.o. female with history of rheumatoid arthritis.  She is on Actemra 8 mg/kg IV infusions every 28 days.  She cannot tolerate taking Plaquenil and discontinued.  She is no longer taking prednisone.  The dose of Actemra was increased once she was cleared by her nephrologist Dr. Upton.  She has had 2 doses of Actemra on the increased dose.  Her most recent infusion was today.  She has noticed a significant improvement in her joint pain and swelling in her hands since increasing the dose.  She is able to make a complete fist and has had less difficulty with ADLs.  She presents today with increased pain in her right knee joint has had difficulty ambulating.  She states her pain is currently a 9 out of 10.  She has noticed some swelling in her right knee joint.  She requested a right knee joint cortisone injection today.  She would like to be able to return to her exercise classes which have helped with her overall joint pain and stiffness in the past.  She would also like to try to start losing some weight since she is no longer taking prednisone.  Activities of Daily Living:  Patient reports joint stiffness all day  Patient Reports nocturnal pain.  Difficulty dressing/grooming: Reports Difficulty climbing stairs: Reports Difficulty getting out of chair: Reports Difficulty using hands for taps, buttons, cutlery, and/or writing: Denies  Review of Systems  Constitutional:  Negative for fatigue.  HENT:  Negative for mouth dryness.   Eyes:  Positive for dryness.  Respiratory:  Negative for shortness of breath.   Cardiovascular:  Positive for swelling in legs/feet.  Gastrointestinal:  Negative for  constipation.  Endocrine: Positive for heat intolerance.  Genitourinary:  Negative for difficulty urinating.  Musculoskeletal:  Positive for joint pain, gait problem, joint pain, joint swelling, muscle weakness and morning stiffness.  Skin:  Negative for rash.  Allergic/Immunologic: Negative for susceptible to infections.  Neurological:  Positive for weakness.  Hematological:  Negative for bruising/bleeding tendency.  Psychiatric/Behavioral:  Positive for sleep disturbance.    PMFS History:  Patient Active Problem List   Diagnosis Date Noted   Dyspnea 11/05/2013   Rheumatoid arthritis (HCC)    Hypertension    GERD (gastroesophageal reflux disease)    Morbid obesity (HCC) 03/10/2013   S/P right THA, AA 03/09/2013    Past Medical History:  Diagnosis Date   Deviated nasal septum    right side, cannot breathe out of right septum   GERD (gastroesophageal reflux disease)    Hypertension    Kidney disease    Plantar fasciitis    Rheumatoid arthritis (HCC)    [facility] and [facility], [facility]   UTI (lower urinary tract infection) 03/02/2013   treated with keflex for hip surgery on 03/09/13    Family History  Problem Relation Age of Onset   Heart disease Father    Kidney disease Father    Heart attack Father    Heart disease Brother    Cancer Brother          agent orange    Rheum arthritis Maternal Aunt    Hypertension Mother    Alzheimer's disease Mother    Heart attack Brother    Vascular Disease Brother    Kidney disease Brother    Past Surgical History:  Procedure Laterality Date   BIOPSY BREAST Left    [facility]   COLON BIOPSY  04/2020   COLONOSCOPY N/A 04/21/2015   DIAGNOSTIC MAMMOGRAM Bilateral 04/05/2016   ingrown toenail removed  5-6 yrs ago   toenail removal Left    TOTAL HIP ARTHROPLASTY Right 03/09/2013   Procedure: RIGHT TOTAL HIP ARTHROPLASTY ANTERIOR APPROACH;  Surgeon: Matthew D Olin, MD;  Location: [facility];  Service: Orthopedics;  Laterality:  Right;   Social History   Social History Narrative   Not on file   Immunization History  Administered Date(s) Administered   Influenza Split 12/26/2012   Influenza-Unspecified 11/29/2013   Moderna Sars-Covid-2 Vaccination 04/01/2019, 04/27/2019, 10/12/2019, 06/22/2020   Pneumococcal Polysaccharide-23 03/11/2013   Zoster Recombinat (Shingrix) 07/08/2017, 12/30/2017     Objective: Vital Signs: BP 125/68 (BP Location: Left Arm, Patient Position: Sitting, Cuff Size: Large)   Pulse 70   Resp 18   Ht 5' 1.5" (1.562 m)   Wt 278 lb (126.1 kg)   BMI 51.68 kg/m    Physical Exam Vitals and nursing note reviewed.  Constitutional:      Appearance: She is well-developed.  HENT:     Head: Normocephalic and atraumatic.  Eyes:     Conjunctiva/sclera: Conjunctivae normal.  Pulmonary:     Effort: Pulmonary effort is normal.  Abdominal:     Palpations: Abdomen is soft.  Musculoskeletal:     Cervical back: Normal range of motion.  Skin:    General: Skin is warm and dry.     Capillary Refill: Capillary refill takes less than 2 seconds.  Neurological:     Mental Status: She is alert and oriented to person, place, and time.  Psychiatric:        Behavior: Behavior normal.     Musculoskeletal Exam: C-spine has good range of motion with no discomfort.  Some postural thoracic kyphosis noted.  Shoulder joint abduction to about 90 degrees bilaterally.  Elbow joints have good range of motion with no tenderness or inflammation.  Wrist joints have slightly limited range of motion but no tenderness or synovitis was noted.  No tenderness over MCP or PIP joints noted.  Some limited flexion of the right fifth PIP joint noted.  Complete fist formation bilaterally.  Hip joints difficult to assess well in seated position.  Painful range of motion of the right knee with warmth and swelling noted.  Left knee joint has good range of motion with no warmth or effusion.  Ankle joints have good range of motion.   Severe pedal edema noted bilaterally.  CDAI Exam: CDAI Score: 2.4  Patient Global: 1 mm; Provider Global: 3 mm Swollen: 1 ; Tender: 1  Joint Exam 10/24/2020      Right  Left  Knee  Swollen Tender        Investigation: No additional findings.  Imaging: No results found.  Recent Labs: Lab Results  Component Value Date   WBC 17.1 (H) 08/29/2020   HGB 12.7 08/29/2020   PLT 278 08/29/2020   NA 136 08/29/2020   K 4.8 08/29/2020   CL 103 08/29/2020   CO2 26 08/29/2020   GLUCOSE 131 (H) 08/29/2020   BUN 30 (H) 08/29/2020   CREATININE 1.42 (H)   08/29/2020   BILITOT 1.0 08/29/2020   ALKPHOS 52 08/29/2020   AST 22 08/29/2020   ALT 19 08/29/2020   PROT 6.1 (L) 08/29/2020   ALBUMIN 3.3 (L) 08/29/2020   CALCIUM 9.1 08/29/2020   GFRAA 48 (L) 10/28/2019   QFTBGOLDPLUS Negative 05/08/2020    Speciality Comments: No specialty comments available.  Procedures:  No procedures performed Allergies: Allopurinol, Asmanex (120 metered doses) [mometasone furoate], Guaifenesin & derivatives, Methotrexate derivatives, Methylprednisolone, Monosodium glutamate, Plaquenil [hydroxychloroquine sulfate], Prednisone, Prevnar 13 [pneumococcal 13-val conj vacc], Shingrix [zoster vac recomb adjuvanted], Tetanus toxoids, Baclofen, Ciprofloxacin, Epiceram, Finasteride, Humira [adalimumab], Keratin, Leflunomide, Other, Polysporin [bacitracin-polymyxin b], Rosuvastatin, Vitamin e, Neosporin [neomycin-bacitracin zn-polymyx], and Sulfa antibiotics   Assessment / Plan:     Visit Diagnoses: Rheumatoid arthritis with rheumatoid factor of multiple sites without organ or systems involvement (HCC) - Dx 20 years ago, Followed by Dr. Syed. 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 ESR 18, CRP<1: She presents today with severe pain and inflammation in the right knee joint.  She has painful range of motion on examination today but was able to fully extend her right knee.  She has been having difficulty ambulating due to the severity of  pain and stiffness.  She has not had a recent injury and is not experiencing any mechanical symptoms.  She is no longer taking prednisone.  The dose of Actemra was recently increased from 4 mg/kg to 8 mg/kg with her infusion on 09/26/2020.  Today was her second infusion on the increased dose of Actemra.  Overall she has noticed a significant improvement in her joint pain and inflammation on the increased dose.  She did not have any tenderness and synovitis in her wrist joints or hands today.  She was able to make a complete fist which she has been unable to do for several months.  Overall her rheumatoid arthritis seems to be better controlled on the current regimen.  The right knee joint was injected with cortisone today.  Procedure note was completed above and aftercare was discussed.  She will follow-up in 2 months to assess her full response to the increased dose of Actemra.  She was advised to notify us if she develops any new or worsening symptoms between now and then.  Primary osteoarthritis of both knees -X-rays of both knees were obtained on 04/07/2019 which were consistent with severe osteoarthritis and severe chondromalacia patella.  She is not experiencing any discomfort in her left knee joint at this time.  She has good range of motion of the left knee joint with no warmth or effusion.   She presents today with increased pain in the right knee joint.  She has not had any recent injury or fall.  She is not experiencing any mechanical symptoms.  She is been having difficulty ambulating due to the severity of pain and stiffness.  She used a wheelchair while in the office today.  On examination she has full flexion and extension with discomfort.  Warmth and swelling of the right knee joint were noted.  She requested a right knee joint cortisone injection.  She tolerated the procedure well.  Procedure note was completed above.  Aftercare was discussed.  Prednisone is listed as an allergy but she has been  tolerating prednisone without any side effects.  She has been taking Xyzal on a daily basis and clarified with her allergist whether she could take prednisone/cortisone.she is aware of the warning signs to monitor for and was advised to be evaluated emergently if she develops shortness   of breath or any other signs of an allergic reaction.  She was advised to notify us if her discomfort persists or worsens.  Plan: Large Joint Inj: R knee  High risk medication use - Actemra 8 mg/kg IV infusions q4wk (started in 2016).  Increase the dose of Actemra from 4 mg to 8 mg/kg starting on 09/26/2020 after receiving clearance by her nephrologist Dr. Hollie Salk. She cannot tolerate taking Plaquenil due to experiencing shortness of breath.  She has a known sulfa allergy.  She could not previously tolerate taking Arava.  She had an allergy to Humira.  She cannot tolerate taking methotrexate in the past.  She is also not a good candidate for methotrexate due to elevated creatinine and low GFR.  She has established care with nephrology.  Discussed the importance of holding Actemra if she develops signs or symptoms of an infection and to resume once infection has completely cleared. She is aware that she will continue to require close monitoring of her lipid panel.  Discussed the importance of trying to increase her exercise regimen as well as avoiding foods that will contribute to high cholesterol.  Status post total hip replacement, right: Doing well.  She is not experiencing any increased pain at this time difficult to assess range of motion while in wheelchair during the examination today.  Other medical conditions are listed as follows:   History of gastroesophageal reflux (GERD)  Essential hypertension: BP was 125/68 today in the office.  Advised to monitor blood pressure following cortisone injection today.   History of hyperlipidemia  History of anemia  Former smoker  Family history of rheumatoid  arthritis  Orders: Orders Placed This Encounter  Procedures   Large Joint Inj: R knee   No orders of the defined types were placed in this encounter.     Follow-Up Instructions: Return in about 2 months (around 12/24/2020) for Rheumatoid arthritis.   Ofilia Neas, PA-C  Note - This record has been created using Dragon software.  Chart creation errors have been sought, but may not always  have been located. Such creation errors do not reflect on  the standard of medical care.

## 2020-10-24 NOTE — Progress Notes (Signed)
   Procedure Note  Patient: Larrie Haffner             Date of Birth: 1950-09-17           MRN: [id number]             Visit Date: 10/24/2020     Procedures: Visit Diagnoses:  1. Primary osteoarthritis of both knees       Large Joint Inj: R knee on 10/24/2020 12:58 PM Indications: pain Details: 27 G 1.5 in needle, medial approach  Arthrogram: No  Medications: 1.5 mL lidocaine 1 %; 40 mg triamcinolone acetonide 40 MG/ML Aspirate: 0 mL Outcome: tolerated well, no immediate complications Procedure, treatment alternatives, risks and benefits explained, specific risks discussed. Consent was given by the patient. Immediately prior to procedure a time out was called to verify the correct patient, procedure, equipment, support staff and site/side marked as required. Patient was prepped and draped in the usual sterile fashion.     Patient tolerated the procedure well.  Aftercare was discussed.   Hazel Sams, PA-C

## 2020-10-31 ENCOUNTER — Ambulatory Visit: Payer: Medicare Other | Admitting: Physician Assistant

## 2020-11-03 ENCOUNTER — Telehealth: Payer: Self-pay | Admitting: *Deleted

## 2020-11-03 NOTE — Telephone Encounter (Signed)
Reviewed with patient. Patient expressed understanding.

## 2020-11-03 NOTE — Telephone Encounter (Signed)
Patient would like to know with the new Covid vaccine coming out next week when does she need to schedule it based on her Actemra infusion. Patient states she had her 4th Covid vaccine on 06/22/2020. Please advise.

## 2020-11-03 NOTE — Telephone Encounter (Signed)
COVID-19 vaccine recommendations:   COVID-19 vaccine is recommended for everyone (unless you are allergic to a vaccine component), even if you are on a medication that suppresses your immune system.   If you are on Methotrexate, Cellcept (mycophenolate), Rinvoq, Morrie Sheldon, and Olumiant- hold the medication for 1 week after each vaccine. Hold Methotrexate for 2 weeks after the single dose COVID-19 vaccine.   If you are on Orencia subcutaneous injection - hold medication one week prior to and one week after the first COVID-19 vaccine dose (only).   If you are on Orencia IV infusions- time vaccination administration so that the first COVID-19 vaccination will occur four weeks after the infusion and postpone the subsequent infusion by one week.   If you are on Cyclophosphamide or Rituxan infusions please contact your doctor prior to receiving the COVID-19 vaccine.   Do not take Tylenol or any anti-inflammatory medications (NSAIDs) 24 hours prior to the COVID-19 vaccination.   There is no direct evidence about the efficacy of the COVID-19 vaccine in individuals who are on medications that suppress the immune system.   Even if you are fully vaccinated, and you are on any medications that suppress your immune system, please continue to wear a mask, maintain at least six feet social distance and practice hand hygiene.   If you develop a COVID-19 infection, please contact your PCP or our office to determine if you need monoclonal antibody infusion.  The booster vaccine is now available for immunocompromised patients.   Please see the following web sites for updated information.   Please let patient review the site for recommendations on COVID-19 vaccination.  She should be able to get the booster when it is available to her.  [email or web address]

## 2020-11-07 DIAGNOSIS — Z23 Encounter for immunization: Secondary | ICD-10-CM | POA: Diagnosis not present

## 2020-11-15 DIAGNOSIS — N6313 Unspecified lump in the right breast, lower outer quadrant: Secondary | ICD-10-CM | POA: Diagnosis not present

## 2020-11-15 DIAGNOSIS — R928 Other abnormal and inconclusive findings on diagnostic imaging of breast: Secondary | ICD-10-CM | POA: Diagnosis not present

## 2020-11-16 DIAGNOSIS — N6313 Unspecified lump in the right breast, lower outer quadrant: Secondary | ICD-10-CM | POA: Diagnosis not present

## 2020-11-16 DIAGNOSIS — R928 Other abnormal and inconclusive findings on diagnostic imaging of breast: Secondary | ICD-10-CM | POA: Diagnosis not present

## 2020-11-16 DIAGNOSIS — D1809 Hemangioma of other sites: Secondary | ICD-10-CM | POA: Diagnosis not present

## 2020-11-16 DIAGNOSIS — D510 Vitamin B12 deficiency anemia due to intrinsic factor deficiency: Secondary | ICD-10-CM | POA: Diagnosis not present

## 2020-11-16 HISTORY — PX: BREAST BIOPSY: SHX20

## 2020-11-21 ENCOUNTER — Encounter (HOSPITAL_COMMUNITY)
Admission: RE | Admit: 2020-11-21 | Discharge: 2020-11-21 | Disposition: A | Discharge status: Home / Self Care | Payer: Medicare Other | Source: Ambulatory Visit | Attending: Physician Assistant | Admitting: Physician Assistant

## 2020-11-21 ENCOUNTER — Other Ambulatory Visit: Payer: Self-pay

## 2020-11-21 DIAGNOSIS — M0579 Rheumatoid arthritis with rheumatoid factor of multiple sites without organ or systems involvement: Secondary | ICD-10-CM | POA: Diagnosis not present

## 2020-11-21 LAB — CBC WITH DIFFERENTIAL/PLATELET
Abs Immature Granulocytes: 0.04 10*3/uL (ref 0.00–0.07)
Basophils Absolute: 0 10*3/uL (ref 0.0–0.1)
Basophils Relative: 0 %
Eosinophils Absolute: 0.3 10*3/uL (ref 0.0–0.5)
Eosinophils Relative: 4 %
HCT: 42.1 % (ref 36.0–46.0)
Hemoglobin: 13.9 g/dL (ref 12.0–15.0)
Immature Granulocytes: 1 %
Lymphocytes Relative: 23 %
Lymphs Abs: 1.9 10*3/uL (ref 0.7–4.0)
MCH: 33.3 pg (ref 26.0–34.0)
MCHC: 33 g/dL (ref 30.0–36.0)
MCV: 101 fL — ABNORMAL HIGH (ref 80.0–100.0)
Monocytes Absolute: 0.7 10*3/uL (ref 0.1–1.0)
Monocytes Relative: 8 %
Neutro Abs: 5.5 10*3/uL (ref 1.7–7.7)
Neutrophils Relative %: 64 %
Platelets: 246 10*3/uL (ref 150–400)
RBC: 4.17 MIL/uL (ref 3.87–5.11)
RDW: 13.1 % (ref 11.5–15.5)
WBC: 8.5 10*3/uL (ref 4.0–10.5)
nRBC: 0 % (ref 0.0–0.2)

## 2020-11-21 LAB — COMPREHENSIVE METABOLIC PANEL
ALT: 24 U/L (ref 0–44)
AST: 26 U/L (ref 15–41)
Albumin: 3.8 g/dL (ref 3.5–5.0)
Alkaline Phosphatase: 50 U/L (ref 38–126)
Anion gap: 8 (ref 5–15)
BUN: 32 mg/dL — ABNORMAL HIGH (ref 8–23)
CO2: 26 mmol/L (ref 22–32)
Calcium: 9.5 mg/dL (ref 8.9–10.3)
Chloride: 104 mmol/L (ref 98–111)
Creatinine, Ser: 1.44 mg/dL — ABNORMAL HIGH (ref 0.44–1.00)
GFR, Estimated: 39 mL/min — ABNORMAL LOW (ref >60)
Glucose, Bld: 95 mg/dL (ref 70–99)
Potassium: 4 mmol/L (ref 3.5–5.1)
Sodium: 138 mmol/L (ref 135–145)
Total Bilirubin: 1.8 mg/dL — ABNORMAL HIGH (ref 0.3–1.2)
Total Protein: 6.6 g/dL (ref 6.5–8.1)

## 2020-11-21 MED ORDER — TOCILIZUMAB 400 MG/20ML IV SOLN
8.0000 mg/kg | INTRAVENOUS | Status: DC
  Administered 2020-11-21: 992 mg via INTRAVENOUS
  Filled 2020-11-21: qty 9.6

## 2020-11-21 NOTE — Progress Notes (Signed)
 Office Visit Note  Patient: [name] [name]             Date of Birth: 07/03/1950           MRN: [id number]             PCP: Moon, Amy A, NP Referring: Moon, Amy A, NP Visit Date: 12/05/2020 Occupation: @GUAROCC@  Subjective:  Medication management.   History of Present Illness: [name] [name] is a 70 y.o. female with a history of seropositive rheumatoid arthritis and osteoarthritis.  She states she is doing better since she has been on increased dose of Actemra.  She is currently on Actemra 8 mg/kg every 28 days.  Her last infusion of Actemra was on November 21, 2020.  She has not noticed any joint swelling.  She has a stiffness in her joints which comes and goes.  She recently had some stiffness in her shoulders.  Her right total hip replacement is doing well.  She has off-and-on discomfort in her knee joints.  Activities of Daily Living:  Patient reports morning stiffness for 0 minutes.   Patient Denies nocturnal pain.  Difficulty dressing/grooming: Reports Difficulty climbing stairs: Reports Difficulty getting out of chair: Denies Difficulty using hands for taps, buttons, cutlery, and/or writing: Denies  Review of Systems  Constitutional:  Negative for fatigue.  HENT:  Negative for mouth sores, mouth dryness and nose dryness.   Eyes:  Positive for itching and dryness. Negative for pain.  Respiratory:  Negative for difficulty breathing.   Cardiovascular:  Negative for chest pain and palpitations.  Gastrointestinal:  Negative for blood in stool, constipation and diarrhea.  Endocrine: Negative for increased urination.  Genitourinary:  Negative for difficulty urinating.  Musculoskeletal:  Positive for joint pain, joint pain, myalgias and myalgias. Negative for joint swelling, morning stiffness and muscle tenderness.  Skin:  Negative for color change, rash, redness and sensitivity to sunlight.  Allergic/Immunologic: Negative for susceptible to infections.  Neurological:   Positive for numbness. Negative for dizziness, headaches, memory loss and weakness.  Hematological:  Positive for bruising/bleeding tendency. Negative for swollen glands.  Psychiatric/Behavioral:  Negative for depressed mood, confusion and sleep disturbance. The patient is not nervous/anxious.    PMFS History:  Patient Active Problem List   Diagnosis Date Noted   Dyspnea 11/05/2013   Rheumatoid arthritis (HCC)    Hypertension    GERD (gastroesophageal reflux disease)    Morbid obesity (HCC) 03/10/2013   S/P right THA, AA 03/09/2013    Past Medical History:  Diagnosis Date   Deviated nasal septum    right side, cannot breathe out of right septum   GERD (gastroesophageal reflux disease)    Hypertension    Kidney disease    Plantar fasciitis    Rheumatoid arthritis (HCC)    oa and ra in arms, shane anderson   UTI (lower urinary tract infection) 03/02/2013   treated with keflex for hip surgery on 03/09/13    Family History  Problem Relation Age of Onset   Heart disease Father    Kidney disease Father    Heart attack Father    Heart disease Brother    Cancer Brother        agent orange    Rheum arthritis Maternal Aunt    Hypertension Mother    Alzheimer's disease Mother    Heart attack Brother    Vascular Disease Brother    Kidney disease Brother    Past Surgical History:  Procedure Laterality Date     BIOPSY BREAST Left    [facility]   BREAST BIOPSY Right 11/16/2020   COLON BIOPSY  04/2020   COLONOSCOPY N/A 04/21/2015   DIAGNOSTIC MAMMOGRAM Bilateral 04/05/2016   ingrown toenail removed  5-6 yrs ago   toenail removal Left    TOTAL HIP ARTHROPLASTY Right 03/09/2013   Procedure: RIGHT TOTAL HIP ARTHROPLASTY ANTERIOR APPROACH;  Surgeon: Mauri Pole, MD;  Location: [facility];  Service: Orthopedics;  Laterality: Right;   Social History   Social History Narrative   Not on file   Immunization History  Administered Date(s) Administered   Influenza Split  12/26/2012   Influenza-Unspecified 11/29/2013, 11/07/2020   Moderna Sars-Covid-2 Vaccination 04/01/2019, 04/27/2019, 10/12/2019, 06/22/2020, 11/07/2020   Pneumococcal Polysaccharide-23 03/11/2013   Zoster Recombinat (Shingrix) 07/08/2017, 12/30/2017     Objective: Vital Signs: BP 123/78 (BP Location: Left Wrist, Patient Position: Sitting, Cuff Size: Normal)   Pulse 73   Ht 5' 1.5" (1.562 m)   Wt 272 lb 12.8 oz (123.7 kg)   BMI 50.71 kg/m    Physical Exam Vitals and nursing note reviewed.  Constitutional:      Appearance: She is well-developed.  HENT:     Head: Normocephalic and atraumatic.  Eyes:     Conjunctiva/sclera: Conjunctivae normal.  Cardiovascular:     Rate and Rhythm: Normal rate and regular rhythm.     Heart sounds: Normal heart sounds.  Pulmonary:     Effort: Pulmonary effort is normal.     Breath sounds: Normal breath sounds.  Abdominal:     General: Bowel sounds are normal.     Palpations: Abdomen is soft.  Musculoskeletal:     Cervical back: Normal range of motion.  Lymphadenopathy:     Cervical: No cervical adenopathy.  Skin:    General: Skin is warm and dry.     Capillary Refill: Capillary refill takes less than 2 seconds.  Neurological:     Mental Status: She is alert and oriented to person, place, and time.  Psychiatric:        Behavior: Behavior normal.     Musculoskeletal Exam: She had limited range of motion for cervical spine.  Shoulder joint abduction was limited to about 110 degrees bilaterally.  Elbow joints with good range of motion.  She had no synovitis of the wrist joints, MCPs, PIPs or DIPs.  Hip joints were difficult to assess in the sitting position.  Knee joints are in good range of motion without any warmth swelling or effusion.  There was no tenderness over ankles or MTPs.  She mobilizes with the help of a cane.  CDAI Exam: CDAI Score: 0.4  Patient Global: 2 mm; Provider Global: 2 mm Swollen: 0 ; Tender: 0  Joint Exam 12/05/2020    No joint exam has been documented for this visit   There is currently no information documented on the homunculus. Go to the Rheumatology activity and complete the homunculus joint exam.  Investigation: No additional findings.  Imaging: No results found.  Recent Labs: Lab Results  Component Value Date   WBC 8.5 11/21/2020   HGB 13.9 11/21/2020   PLT 246 11/21/2020   NA 138 11/21/2020   K 4.0 11/21/2020   CL 104 11/21/2020   CO2 26 11/21/2020   GLUCOSE 95 11/21/2020   BUN 32 (H) 11/21/2020   CREATININE 1.44 (H) 11/21/2020   BILITOT 1.8 (H) 11/21/2020   ALKPHOS 50 11/21/2020   AST 26 11/21/2020   ALT 24 11/21/2020   PROT 6.6  11/21/2020   ALBUMIN 3.8 11/21/2020   CALCIUM 9.5 11/21/2020   GFRAA 48 (L) 10/28/2019   QFTBGOLDPLUS Negative 05/08/2020    Speciality Comments: No specialty comments available.  Procedures:  No procedures performed Allergies: Allopurinol, Asmanex (120 metered doses) [mometasone furoate], Guaifenesin & derivatives, Methotrexate derivatives, Methylprednisolone, Monosodium glutamate, Plaquenil [hydroxychloroquine sulfate], Prednisone, Prevnar 13 [pneumococcal 13-val conj vacc], Shingrix [zoster vac recomb adjuvanted], Tetanus toxoids, Baclofen, Ciprofloxacin, Epiceram, Finasteride, Humira [adalimumab], Keratin, Leflunomide, Other, Polysporin [bacitracin-polymyxin b], Rosuvastatin, Vitamin e, Neosporin [neomycin-bacitracin zn-polymyx], and Sulfa antibiotics   Assessment / Plan:     Visit Diagnoses: Rheumatoid arthritis with rheumatoid factor of multiple sites without organ or systems involvement (HCC) - Dx 20 years ago, Followed by Dr. Syed. 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 ESR 18, CRP<1: She is doing much better on increased dose of Actemra at 8 mg/kg every 4 weeks.  She had no synovitis on examination.  Although she continues to have some discomfort in her joints as described above.  High risk medication use - Actemra 8 mg/kg IV infusions q4wk (started in  2016). Increase the dose of Actemra from 4 mg to 8 mg/kg starting on 09/26/2020 after receiving clearance.  Labs from November 21, 2020 were reviewed CBC and CMP were normal except for elevated creatinine of 1.44 and GFR 48.  Information regarding ministration was given.  She was advised to stop Actemra in case she develops an infection and restart after the infection resolves.  Status post total hip replacement, right-she has limited range of motion but not much discomfort.  Primary osteoarthritis of both knees - X-rays of both knees were obtained on 04/07/2019 which were consistent with severe osteoarthritis and severe chondromalacia patella.  She is off-and-on discomfort in her knee joints.  She mobilizes with the help of a cane.  Stage IIIa chronic kidney disease-she was evaluated by the nephrologist.  She will have a follow-up appointment.  History of gastroesophageal reflux (GERD)  Essential hypertension-blood pressure was normal today.  History of hyperlipidemia-dietary modifications were discussed.  History of anemia  Former smoker  Family history of rheumatoid arthritis  Orders: No orders of the defined types were placed in this encounter.    Follow-Up Instructions: Return in about 3 months (around 03/07/2021) for Rheumatoid arthritis.   Shaili Deveshwar, MD  Note - This record has been created using Dragon software.  Chart creation errors have been sought, but may not always  have been located. Such creation errors do not reflect on  the standard of medical care.  

## 2020-11-21 NOTE — Progress Notes (Signed)
MCV remains borderline elevated. Rest of CBC WNL.

## 2020-11-21 NOTE — Progress Notes (Signed)
Creatinine remains elevated but stable-1.44 and GFR remains low but stable-39.  Bilirubin is borderline elevated.  LFTs WNL. Please notify the patient and forward lab work to her nephrologist.

## 2020-11-23 DIAGNOSIS — L089 Local infection of the skin and subcutaneous tissue, unspecified: Secondary | ICD-10-CM | POA: Diagnosis not present

## 2020-12-05 ENCOUNTER — Encounter: Payer: Self-pay | Admitting: Rheumatology

## 2020-12-05 ENCOUNTER — Other Ambulatory Visit: Payer: Self-pay

## 2020-12-05 ENCOUNTER — Ambulatory Visit (INDEPENDENT_AMBULATORY_CARE_PROVIDER_SITE_OTHER): Payer: Medicare Other | Admitting: Rheumatology

## 2020-12-05 VITALS — BP 123/78 | HR 73 | Ht 61.5 in | Wt 272.8 lb

## 2020-12-05 DIAGNOSIS — Z87891 Personal history of nicotine dependence: Secondary | ICD-10-CM

## 2020-12-05 DIAGNOSIS — I10 Essential (primary) hypertension: Secondary | ICD-10-CM

## 2020-12-05 DIAGNOSIS — Z96641 Presence of right artificial hip joint: Secondary | ICD-10-CM

## 2020-12-05 DIAGNOSIS — M170 Bilateral primary osteoarthritis of knee: Secondary | ICD-10-CM

## 2020-12-05 DIAGNOSIS — N1831 Chronic kidney disease, stage 3a: Secondary | ICD-10-CM

## 2020-12-05 DIAGNOSIS — Z862 Personal history of diseases of the blood and blood-forming organs and certain disorders involving the immune mechanism: Secondary | ICD-10-CM | POA: Diagnosis not present

## 2020-12-05 DIAGNOSIS — Z8719 Personal history of other diseases of the digestive system: Secondary | ICD-10-CM

## 2020-12-05 DIAGNOSIS — M0579 Rheumatoid arthritis with rheumatoid factor of multiple sites without organ or systems involvement: Secondary | ICD-10-CM

## 2020-12-05 DIAGNOSIS — Z8261 Family history of arthritis: Secondary | ICD-10-CM

## 2020-12-05 DIAGNOSIS — Z8639 Personal history of other endocrine, nutritional and metabolic disease: Secondary | ICD-10-CM

## 2020-12-05 DIAGNOSIS — Z79899 Other long term (current) drug therapy: Secondary | ICD-10-CM

## 2020-12-05 NOTE — Patient Instructions (Signed)

## 2020-12-07 ENCOUNTER — Other Ambulatory Visit: Payer: Self-pay | Admitting: Pharmacist

## 2020-12-07 DIAGNOSIS — M0579 Rheumatoid arthritis with rheumatoid factor of multiple sites without organ or systems involvement: Secondary | ICD-10-CM

## 2020-12-07 DIAGNOSIS — Z79899 Other long term (current) drug therapy: Secondary | ICD-10-CM

## 2020-12-07 NOTE — Progress Notes (Signed)
Next infusion scheduled for Actemra IV on 12/19/20 and due for updated orders. Diagnosis:  Dose: 8mg /kg every 4 weeks (cleared by [facility] for higher dose infusion on 09/25/20)  Last Clinic Visit: 12/05/20 Next Clinic Visit: 03/06/21  Last infusion: 11/21/20  Labs: CBC and CMP on 11/21/20 - creatinine stable TB Gold: negative on 05/08/20  Lipid panel drawn on 08/29/20 - wnl  Orders placed for Actemra IV x 3 doses along with premedication of acetaminophen and diphenhydramine to be administered 30 minutes before medication infusion.  Standing CBC with diff/platelet and CMP with GFR orders placed to be drawn every 2 months.  Next TB gold due 05/08/21  Knox Saliva, PharmD, MPH, BCPS Clinical Pharmacist (Rheumatology and Pulmonology)

## 2020-12-19 ENCOUNTER — Other Ambulatory Visit: Payer: Self-pay

## 2020-12-19 ENCOUNTER — Encounter (HOSPITAL_COMMUNITY)
Admission: RE | Admit: 2020-12-19 | Discharge: 2020-12-19 | Disposition: A | Discharge status: Home / Self Care | Payer: Medicare Other | Source: Ambulatory Visit | Attending: Rheumatology | Admitting: Rheumatology

## 2020-12-19 DIAGNOSIS — M0579 Rheumatoid arthritis with rheumatoid factor of multiple sites without organ or systems involvement: Secondary | ICD-10-CM | POA: Diagnosis not present

## 2020-12-19 MED ORDER — TOCILIZUMAB 400 MG/20ML IV SOLN
8.0000 mg/kg | INTRAVENOUS | Status: DC
  Administered 2020-12-19: 990 mg via INTRAVENOUS
  Filled 2020-12-19: qty 49.5

## 2020-12-19 MED ORDER — ACETAMINOPHEN 325 MG PO TABS: 650.0000 mg | ORAL_TABLET | ORAL | Status: DC

## 2020-12-19 MED ORDER — DIPHENHYDRAMINE HCL 25 MG PO CAPS: 25.0000 mg | ORAL_CAPSULE | ORAL | Status: DC

## 2020-12-21 DIAGNOSIS — N182 Chronic kidney disease, stage 2 (mild): Secondary | ICD-10-CM | POA: Diagnosis not present

## 2020-12-21 DIAGNOSIS — Z6841 Body Mass Index (BMI) 40.0 and over, adult: Secondary | ICD-10-CM | POA: Diagnosis not present

## 2020-12-21 DIAGNOSIS — R739 Hyperglycemia, unspecified: Secondary | ICD-10-CM | POA: Diagnosis not present

## 2020-12-21 DIAGNOSIS — M069 Rheumatoid arthritis, unspecified: Secondary | ICD-10-CM | POA: Diagnosis not present

## 2020-12-21 DIAGNOSIS — Z792 Long term (current) use of antibiotics: Secondary | ICD-10-CM | POA: Diagnosis not present

## 2020-12-21 DIAGNOSIS — H1013 Acute atopic conjunctivitis, bilateral: Secondary | ICD-10-CM | POA: Diagnosis not present

## 2020-12-21 DIAGNOSIS — E785 Hyperlipidemia, unspecified: Secondary | ICD-10-CM | POA: Diagnosis not present

## 2020-12-21 DIAGNOSIS — D510 Vitamin B12 deficiency anemia due to intrinsic factor deficiency: Secondary | ICD-10-CM | POA: Diagnosis not present

## 2020-12-21 DIAGNOSIS — I10 Essential (primary) hypertension: Secondary | ICD-10-CM | POA: Diagnosis not present

## 2020-12-21 DIAGNOSIS — L089 Local infection of the skin and subcutaneous tissue, unspecified: Secondary | ICD-10-CM | POA: Diagnosis not present

## 2021-01-01 DIAGNOSIS — Z20822 Contact with and (suspected) exposure to covid-19: Secondary | ICD-10-CM | POA: Diagnosis not present

## 2021-01-04 ENCOUNTER — Telehealth: Payer: Self-pay | Admitting: Rheumatology

## 2021-01-04 DIAGNOSIS — H15113 Episcleritis periodica fugax, bilateral: Secondary | ICD-10-CM | POA: Diagnosis not present

## 2021-01-04 NOTE — Telephone Encounter (Signed)
Patient advised to schedule an urgent evaluation with her ophthalmologist for further evaluation. She can try using refresh gel drops for lubrication in the meantime.     Patient advised the redness could be due to inflammation in the eyes secondary to RA so she will need further eval quickly.  Infection also needs to be ruled out since she is on actemra. Patient expressed understanding and will schedule an appointment with her ophthalmologist.

## 2021-01-04 NOTE — Telephone Encounter (Signed)
Patient calling in reference to her eyes. They are becoming bloodshot more and more. Patient states they are beginning to hurt. Patient was calling to see what she could do. She did try a cold compress which relived it for a very brief time. Please call to discuss.

## 2021-01-04 NOTE — Telephone Encounter (Signed)
Patient calling in reference to her eyes. She states they are becoming bloodshot more and more. Patient states they are beginning to hurt. She did try a cold compress which relived it for a very brief time. Patient states it has been going on for a couple of months. Patient states she thought it may be dry eyes and tried lubricating eye drops which did not help. Patient states she also tried allergy eye drops which did not help. Patient states she will be picking up Refresh today to see if that helps. Patient states the only change was the increase in Actemra. She states the whites of her eyes are completely red.  Patient would like to know if this is related to the Actemra and what she should do about this.

## 2021-01-16 ENCOUNTER — Ambulatory Visit (HOSPITAL_COMMUNITY)
Admission: RE | Admit: 2021-01-16 | Discharge: 2021-01-16 | Disposition: A | Discharge status: Home / Self Care | Payer: Medicare Other | Source: Ambulatory Visit | Attending: Rheumatology | Admitting: Rheumatology

## 2021-01-16 ENCOUNTER — Other Ambulatory Visit: Payer: Self-pay

## 2021-01-16 DIAGNOSIS — Z79899 Other long term (current) drug therapy: Secondary | ICD-10-CM | POA: Diagnosis not present

## 2021-01-16 DIAGNOSIS — M0579 Rheumatoid arthritis with rheumatoid factor of multiple sites without organ or systems involvement: Secondary | ICD-10-CM | POA: Diagnosis not present

## 2021-01-16 LAB — COMPREHENSIVE METABOLIC PANEL
ALT: 20 U/L (ref 0–44)
AST: 27 U/L (ref 15–41)
Albumin: 3.5 g/dL (ref 3.5–5.0)
Alkaline Phosphatase: 47 U/L (ref 38–126)
Anion gap: 7 (ref 5–15)
BUN: 26 mg/dL — ABNORMAL HIGH (ref 8–23)
CO2: 26 mmol/L (ref 22–32)
Calcium: 9 mg/dL (ref 8.9–10.3)
Chloride: 105 mmol/L (ref 98–111)
Creatinine, Ser: 1.24 mg/dL — ABNORMAL HIGH (ref 0.44–1.00)
GFR, Estimated: 47 mL/min — ABNORMAL LOW (ref >60)
Glucose, Bld: 114 mg/dL — ABNORMAL HIGH (ref 70–99)
Potassium: 4.6 mmol/L (ref 3.5–5.1)
Sodium: 138 mmol/L (ref 135–145)
Total Bilirubin: 1.5 mg/dL — ABNORMAL HIGH (ref 0.3–1.2)
Total Protein: 5.9 g/dL — ABNORMAL LOW (ref 6.5–8.1)

## 2021-01-16 LAB — CBC WITH DIFFERENTIAL/PLATELET
Abs Immature Granulocytes: 0.04 10*3/uL (ref 0.00–0.07)
Basophils Absolute: 0 10*3/uL (ref 0.0–0.1)
Basophils Relative: 0 %
Eosinophils Absolute: 0.3 10*3/uL (ref 0.0–0.5)
Eosinophils Relative: 4 %
HCT: 39.6 % (ref 36.0–46.0)
Hemoglobin: 13.4 g/dL (ref 12.0–15.0)
Immature Granulocytes: 1 %
Lymphocytes Relative: 19 %
Lymphs Abs: 1.5 10*3/uL (ref 0.7–4.0)
MCH: 33.7 pg (ref 26.0–34.0)
MCHC: 33.8 g/dL (ref 30.0–36.0)
MCV: 99.5 fL (ref 80.0–100.0)
Monocytes Absolute: 0.5 10*3/uL (ref 0.1–1.0)
Monocytes Relative: 6 %
Neutro Abs: 5.7 10*3/uL (ref 1.7–7.7)
Neutrophils Relative %: 70 %
Platelets: 244 10*3/uL (ref 150–400)
RBC: 3.98 MIL/uL (ref 3.87–5.11)
RDW: 13 % (ref 11.5–15.5)
WBC: 8.1 10*3/uL (ref 4.0–10.5)
nRBC: 0 % (ref 0.0–0.2)

## 2021-01-16 MED ORDER — ACETAMINOPHEN 325 MG PO TABS: 650.0000 mg | ORAL_TABLET | ORAL | Status: DC

## 2021-01-16 MED ORDER — TOCILIZUMAB 400 MG/20ML IV SOLN
800.0000 mg | INTRAVENOUS | Status: DC
  Administered 2021-01-16: 800 mg via INTRAVENOUS
  Filled 2021-01-16: qty 40

## 2021-01-16 MED ORDER — DIPHENHYDRAMINE HCL 25 MG PO CAPS: 25.0000 mg | ORAL_CAPSULE | ORAL | Status: DC

## 2021-01-16 NOTE — Progress Notes (Signed)
CBC WNL Creatinine has improved.  GFR is slightly low but improved-47. Glucose is 114.  Total bilirubin remains slightly elevated but has improved.  We will continue to monitor lab work closely.  Please forward lab results to the patients nephrologist.

## 2021-01-22 DIAGNOSIS — D510 Vitamin B12 deficiency anemia due to intrinsic factor deficiency: Secondary | ICD-10-CM | POA: Diagnosis not present

## 2021-01-29 DIAGNOSIS — H11152 Pinguecula, left eye: Secondary | ICD-10-CM | POA: Diagnosis not present

## 2021-01-29 DIAGNOSIS — H15113 Episcleritis periodica fugax, bilateral: Secondary | ICD-10-CM | POA: Diagnosis not present

## 2021-02-06 DIAGNOSIS — L814 Other melanin hyperpigmentation: Secondary | ICD-10-CM | POA: Diagnosis not present

## 2021-02-06 DIAGNOSIS — L821 Other seborrheic keratosis: Secondary | ICD-10-CM | POA: Diagnosis not present

## 2021-02-06 DIAGNOSIS — L820 Inflamed seborrheic keratosis: Secondary | ICD-10-CM | POA: Diagnosis not present

## 2021-02-12 ENCOUNTER — Ambulatory Visit (HOSPITAL_COMMUNITY): Payer: Medicare Other

## 2021-02-13 ENCOUNTER — Other Ambulatory Visit: Payer: Self-pay

## 2021-02-13 ENCOUNTER — Ambulatory Visit (HOSPITAL_COMMUNITY)
Admission: RE | Admit: 2021-02-13 | Discharge: 2021-02-13 | Disposition: A | Discharge status: Home / Self Care | Payer: Medicare Other | Source: Ambulatory Visit | Attending: Rheumatology | Admitting: Rheumatology

## 2021-02-13 DIAGNOSIS — M0579 Rheumatoid arthritis with rheumatoid factor of multiple sites without organ or systems involvement: Secondary | ICD-10-CM | POA: Diagnosis not present

## 2021-02-13 MED ORDER — DIPHENHYDRAMINE HCL 25 MG PO CAPS: 25.0000 mg | ORAL_CAPSULE | ORAL | Status: DC

## 2021-02-13 MED ORDER — ACETAMINOPHEN 325 MG PO TABS: 650.0000 mg | ORAL_TABLET | ORAL | Status: DC

## 2021-02-13 MED ORDER — TOCILIZUMAB 400 MG/20ML IV SOLN
8.0000 mg/kg | INTRAVENOUS | Status: DC
  Administered 2021-02-13: 12:00:00 988 mg via INTRAVENOUS
  Filled 2021-02-13: qty 40

## 2021-02-16 ENCOUNTER — Other Ambulatory Visit: Payer: Self-pay | Admitting: Pharmacist

## 2021-02-16 DIAGNOSIS — Z79899 Other long term (current) drug therapy: Secondary | ICD-10-CM

## 2021-02-16 DIAGNOSIS — M0579 Rheumatoid arthritis with rheumatoid factor of multiple sites without organ or systems involvement: Secondary | ICD-10-CM

## 2021-02-16 DIAGNOSIS — Z111 Encounter for screening for respiratory tuberculosis: Secondary | ICD-10-CM

## 2021-02-16 NOTE — Progress Notes (Signed)
Next infusion scheduled for Actemra IV on 03/13/21 and due for updated orders. Diagnosis: RA  Dose: 8mg /kg every 4 weeks (988mg  based on last recorded weight of 123.4 kg)  Last Clinic Visit: 12/05/20 Next Clinic Visit: 03/06/21  Last infusion: 02/13/21  Labs: CBC and CMP On 01/16/21 - improved creatinine but GFR still slightly low TB Gold: negative 05/08/20   Orders placed for Actemra IV x 3 doses along with premedication of acetaminophen and diphenhydramine to be administered 30 minutes before medication infusion.  Standing CBC with diff/platelet and CMP with GFR orders placed to be drawn every 2 months.  Next TB gold due 05/08/21. Future lab order for TB gold placed today  Knox Saliva, PharmD, MPH, BCPS Clinical Pharmacist (Rheumatology and Pulmonology)

## 2021-02-20 NOTE — Progress Notes (Signed)
Office Visit Note  Patient: [name] [name]             Date of Birth: 04-20-1950           MRN: [id number]             PCP: Lowella Dandy, NP Referring: Lowella Dandy, NP Visit Date: 03/06/2021 Occupation: _0 @  Subjective:  Medication monitoring   History of Present Illness: [name] [name] is a 70 y.o. female with history of seropositive rheumatoid arthritis and osteoarthritis.  Patient is currently on Actemra 8 mg/kg IV infusions every 28 days.  Her last infusion was on 02/13/21.  She has been tolerating the increased dose of Actemra without any side effects or increased infections.  She denies any recent rheumatoid arthritis flares.  She has noticed over a 95% improvement in her joint pain and inflammation since increasing the dose of Actemra.  She is able to make a complete fist which she was not able to do prior.  She is also noticed some increased mobility.  She has been going to a stretch and strength class at least once a week which has been helpful.  She has occasional discomfort in her knee joints and uses Aspercreme as needed.  The right knee joint injection in August 2022 provided significant pain relief.  She continues to use a walker or cane to assist with ambulation.   Activities of Daily Living:  Patient reports morning stiffness for 0 minutes.   Patient Denies nocturnal pain.  Difficulty dressing/grooming: Reports Difficulty climbing stairs: Reports Difficulty getting out of chair: Denies Difficulty using hands for taps, buttons, cutlery, and/or writing: Denies  Review of Systems  Constitutional:  Negative for fatigue.  HENT:  Positive for mouth dryness. Negative for mouth sores and nose dryness.   Eyes:  Positive for redness and dryness. Negative for pain and itching.  Respiratory:  Negative for shortness of breath and difficulty breathing.   Cardiovascular:  Negative for chest pain and palpitations.  Gastrointestinal:  Negative for blood in stool, constipation and  diarrhea.  Endocrine: Negative for increased urination.  Genitourinary:  Negative for difficulty urinating.  Musculoskeletal:  Positive for joint pain and joint pain. Negative for joint swelling, myalgias, morning stiffness, muscle tenderness and myalgias.  Skin:  Negative for color change, rash and redness.  Allergic/Immunologic: Negative for susceptible to infections.  Neurological:  Negative for dizziness, numbness, headaches, memory loss and weakness.  Hematological:  Positive for bruising/bleeding tendency.  Psychiatric/Behavioral:  Negative for confusion.    PMFS History:  Patient Active Problem List   Diagnosis Date Noted   Dyspnea 11/05/2013   Rheumatoid arthritis ([place])    Hypertension    GERD (gastroesophageal reflux disease)    Morbid obesity ([place]) 03/10/2013   S/P right THA, AA 03/09/2013    Past Medical History:  Diagnosis Date   Deviated nasal septum    right side, cannot breathe out of right septum   Episcleritis periodica fugax of both eyes    GERD (gastroesophageal reflux disease)    Hypertension    Kidney disease    Pinguecula of left eye    Plantar fasciitis    Rheumatoid arthritis ([place])    oa and ra in arms, shane anderson   Stasis dermatitis    UTI (lower urinary tract infection) 03/02/2013   treated with keflex for hip surgery on 03/09/13    Family History  Problem Relation Age of Onset   Heart disease Father    Kidney disease  Father    Heart attack Father    Heart disease Brother    Cancer Brother        agent orange    Rheum arthritis Maternal Aunt    Hypertension Mother    Alzheimer's disease Mother    Heart attack Brother    Vascular Disease Brother    Kidney disease Brother    Past Surgical History:  Procedure Laterality Date   BIOPSY BREAST Left    [facility]   BREAST BIOPSY Right 11/16/2020   COLON BIOPSY  04/2020   COLONOSCOPY N/A 04/21/2015   DIAGNOSTIC MAMMOGRAM Bilateral 04/05/2016   ingrown toenail removed  5-6 yrs ago    toenail removal Left    TOTAL HIP ARTHROPLASTY Right 03/09/2013   Procedure: RIGHT TOTAL HIP ARTHROPLASTY ANTERIOR APPROACH;  Surgeon: Mauri Pole, MD;  Location: [facility];  Service: Orthopedics;  Laterality: Right;   Social History   Social History Narrative   Not on file   Immunization History  Administered Date(s) Administered   Influenza Split 12/26/2012   Influenza-Unspecified 11/29/2013, 11/07/2020   Moderna Sars-Covid-2 Vaccination 04/01/2019, 04/27/2019, 10/12/2019, 06/22/2020, 11/07/2020   Pneumococcal Polysaccharide-23 03/11/2013   Zoster Recombinat (Shingrix) 07/08/2017, 12/30/2017     Objective: Vital Signs: BP 122/76 (BP Location: Right Arm, Patient Position: Sitting, Cuff Size: Large)    Pulse 64    Ht _0  (1.549 m)    Wt 282 lb 3.2 oz (128 kg)    BMI 53.32 kg/m    Physical Exam Vitals and nursing note reviewed.  Constitutional:      Appearance: She is well-developed.  HENT:     Head: Normocephalic and atraumatic.  Eyes:     Conjunctiva/sclera: Conjunctivae normal.  Pulmonary:     Effort: Pulmonary effort is normal.  Abdominal:     Palpations: Abdomen is soft.  Musculoskeletal:     Cervical back: Normal range of motion.  Skin:    General: Skin is warm and dry.     Capillary Refill: Capillary refill takes less than 2 seconds.  Neurological:     Mental Status: She is alert and oriented to person, place, and time.  Psychiatric:        Behavior: Behavior normal.     Musculoskeletal Exam: Patient remained seated during the examination.  C-spine has good range of motion with no discomfort.  Some postural thoracic kyphosis noted.  No midline spinal tenderness or SI joint tenderness noted.  Shoulder joints have good range of motion on examination today.  Elbow joints have good range of motion with some tenderness over the right elbow.  Rheumatoid nodule palpable in the extensor surface of the right elbow.  Limited range of motion of both wrist joints.  She has  some tenderness over the radial aspect of the left wrist but no synovitis was noted.  PIP and DIP thickening consistent with osteoarthritis of both hands.  No tenderness or synovitis over MCP joints.  Complete fist formation bilaterally.  Right hip replacement difficult to assess in seated position.  Knee joints have good range of motion with some warmth in the right knee.  Pedal edema noted bilaterally.  CDAI Exam: CDAI Score: 0.4  Patient Global: 2 mm; Provider Global: 2 mm Swollen: 0 ; Tender: 0  Joint Exam 03/06/2021   No joint exam has been documented for this visit   There is currently no information documented on the homunculus. Go to the Rheumatology activity and complete the homunculus joint exam.  Investigation:  No additional findings.  Imaging: No results found.  Recent Labs: Lab Results  Component Value Date   WBC 8.1 01/16/2021   HGB 13.4 01/16/2021   PLT 244 01/16/2021   NA 138 01/16/2021   K 4.6 01/16/2021   CL 105 01/16/2021   CO2 26 01/16/2021   GLUCOSE 114 (H) 01/16/2021   BUN 26 (H) 01/16/2021   CREATININE 1.24 (H) 01/16/2021   BILITOT 1.5 (H) 01/16/2021   ALKPHOS 47 01/16/2021   AST 27 01/16/2021   ALT 20 01/16/2021   PROT 5.9 (L) 01/16/2021   ALBUMIN 3.5 01/16/2021   CALCIUM 9.0 01/16/2021   GFRAA 48 (L) 10/28/2019   QFTBGOLDPLUS Negative 05/08/2020    Speciality Comments: No specialty comments available.  Procedures:  No procedures performed Allergies: Allopurinol, Asmanex (120 metered doses) [mometasone furoate], Guaifenesin & derivatives, Methotrexate derivatives, Methylprednisolone, Monosodium glutamate, Plaquenil [hydroxychloroquine sulfate], Prednisone, Prevnar 13 [pneumococcal 13-val conj vacc], Shingrix [zoster vac recomb adjuvanted], Tetanus toxoids, Baclofen, Ciprofloxacin, Epiceram, Finasteride, Humira [adalimumab], Keratin, Leflunomide, Other, Polysporin [bacitracin-polymyxin b], Rosuvastatin, Vitamin e, Neosporin [neomycin-bacitracin  zn-polymyx], and Sulfa antibiotics   Assessment / Plan:     Visit Diagnoses: Rheumatoid arthritis with rheumatoid factor of multiple sites without organ or systems involvement (HCC) - Dx 20 years ago, Followed by Dr. Dossie Der. 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 ESR 18, CRP<1: She has no joint tenderness or synovitis on examination today.  She has not had any signs or symptoms of a rheumatoid arthritis flare recently.  She is clinically doing well on Actemra 8 mg/kg IV infusions every 28 days.  She increased the dose of actemra in August 2022 and has noticed about a 95% improvement in her joint pain and functionality.  She has been able to make a complete fist bilaterally which she was unable to do so prior to increasing the dose of Actemra.  She has also been going to a stretch and strength class 1-2 days per week which has been helpful in increasing her mobility.  She will remain on the current treatment regimen.  She was advised to notify us if she develops increased joint pain or joint swelling.  She will follow-up in the office in 3 months.   High risk medication use - Actemra 8 mg/kg IV infusions q4wk (started in 2016). Increase the dose of Actemra from 4 mg to 8 mg/kg starting on 09/26/2020 after receiving clearance.  CBC and CMP updated on 01/16/2021. She will continue to have routine lab work drawn with infusions. TB Gold negative on 05/08/2020 and will continue to be monitored yearly.   She has not had any recent infections. Discussed the importance of postponing Actemra infusions if she develops signs or symptoms of an infection and to resume once the infection has completely cleared.  Status post total hip replacement, right: Doing well.  She is not having any increased groin pain.  She uses a cane or walker to assist with ambulation.   Primary osteoarthritis of both knees - X-rays of both knees were obtained on 04/07/2019 which were consistent with severe osteoarthritis and severe chondromalacia patella.  She  has occasional discomfort in both knee joints but overall her pain has improved.  She has good range of motion of both knee joints on examination today with some warmth in the right knee.  She had a right knee joint cortisone injection performed on 10/24/2020 which provided significant pain relief.  She has continued to use a cane or walker to assist with ambulation.  She has been going  to a stretching strengthening class 1-2 times per week.   Stage 3a chronic kidney disease ([place]): She follows up at [facility].  Discussed the importance of avoiding NSAID use.  Other medical conditions are listed as follows:   History of gastroesophageal reflux (GERD)  History of hyperlipidemia  Essential hypertension  History of anemia  Former smoker  Family history of rheumatoid arthritis  Orders: No orders of the defined types were placed in this encounter.  No orders of the defined types were placed in this encounter.   Follow-Up Instructions: Return in about 3 months (around 06/04/2021) for Rheumatoid arthritis, Osteoarthritis.   Ofilia Neas, PA-C  Note - This record has been created using Dragon software.  Chart creation errors have been sought, but may not always  have been located. Such creation errors do not reflect on  the standard of medical care.

## 2021-02-22 DIAGNOSIS — Z20822 Contact with and (suspected) exposure to covid-19: Secondary | ICD-10-CM | POA: Diagnosis not present

## 2021-02-27 DIAGNOSIS — D510 Vitamin B12 deficiency anemia due to intrinsic factor deficiency: Secondary | ICD-10-CM | POA: Diagnosis not present

## 2021-03-06 ENCOUNTER — Encounter: Payer: Self-pay | Admitting: Physician Assistant

## 2021-03-06 ENCOUNTER — Other Ambulatory Visit: Payer: Self-pay

## 2021-03-06 ENCOUNTER — Ambulatory Visit (INDEPENDENT_AMBULATORY_CARE_PROVIDER_SITE_OTHER): Payer: Medicare Other | Admitting: Physician Assistant

## 2021-03-06 VITALS — BP 122/76 | HR 64 | Ht 61.0 in | Wt 282.2 lb

## 2021-03-06 DIAGNOSIS — Z8719 Personal history of other diseases of the digestive system: Secondary | ICD-10-CM | POA: Diagnosis not present

## 2021-03-06 DIAGNOSIS — I10 Essential (primary) hypertension: Secondary | ICD-10-CM

## 2021-03-06 DIAGNOSIS — Z96641 Presence of right artificial hip joint: Secondary | ICD-10-CM

## 2021-03-06 DIAGNOSIS — Z87891 Personal history of nicotine dependence: Secondary | ICD-10-CM | POA: Diagnosis not present

## 2021-03-06 DIAGNOSIS — Z79899 Other long term (current) drug therapy: Secondary | ICD-10-CM

## 2021-03-06 DIAGNOSIS — N1831 Chronic kidney disease, stage 3a: Secondary | ICD-10-CM | POA: Diagnosis not present

## 2021-03-06 DIAGNOSIS — Z862 Personal history of diseases of the blood and blood-forming organs and certain disorders involving the immune mechanism: Secondary | ICD-10-CM

## 2021-03-06 DIAGNOSIS — M170 Bilateral primary osteoarthritis of knee: Secondary | ICD-10-CM

## 2021-03-06 DIAGNOSIS — Z8261 Family history of arthritis: Secondary | ICD-10-CM

## 2021-03-06 DIAGNOSIS — Z8639 Personal history of other endocrine, nutritional and metabolic disease: Secondary | ICD-10-CM | POA: Diagnosis not present

## 2021-03-06 DIAGNOSIS — M0579 Rheumatoid arthritis with rheumatoid factor of multiple sites without organ or systems involvement: Secondary | ICD-10-CM | POA: Diagnosis not present

## 2021-03-13 ENCOUNTER — Other Ambulatory Visit: Payer: Self-pay

## 2021-03-13 ENCOUNTER — Ambulatory Visit (HOSPITAL_COMMUNITY)
Admission: RE | Admit: 2021-03-13 | Discharge: 2021-03-13 | Disposition: A | Discharge status: Home / Self Care | Payer: Medicare Other | Source: Ambulatory Visit | Attending: Rheumatology | Admitting: Rheumatology

## 2021-03-13 DIAGNOSIS — M0579 Rheumatoid arthritis with rheumatoid factor of multiple sites without organ or systems involvement: Secondary | ICD-10-CM | POA: Insufficient documentation

## 2021-03-13 DIAGNOSIS — Z79899 Other long term (current) drug therapy: Secondary | ICD-10-CM | POA: Insufficient documentation

## 2021-03-13 LAB — COMPREHENSIVE METABOLIC PANEL
ALT: 22 U/L (ref 0–44)
AST: 24 U/L (ref 15–41)
Albumin: 3.7 g/dL (ref 3.5–5.0)
Alkaline Phosphatase: 48 U/L (ref 38–126)
Anion gap: 10 (ref 5–15)
BUN: 29 mg/dL — ABNORMAL HIGH (ref 8–23)
CO2: 22 mmol/L (ref 22–32)
Calcium: 9.1 mg/dL (ref 8.9–10.3)
Chloride: 106 mmol/L (ref 98–111)
Creatinine, Ser: 1.48 mg/dL — ABNORMAL HIGH (ref 0.44–1.00)
GFR, Estimated: 38 mL/min — ABNORMAL LOW (ref >60)
Glucose, Bld: 117 mg/dL — ABNORMAL HIGH (ref 70–99)
Potassium: 4.4 mmol/L (ref 3.5–5.1)
Sodium: 138 mmol/L (ref 135–145)
Total Bilirubin: 1.7 mg/dL — ABNORMAL HIGH (ref 0.3–1.2)
Total Protein: 6.1 g/dL — ABNORMAL LOW (ref 6.5–8.1)

## 2021-03-13 LAB — CBC WITH DIFFERENTIAL/PLATELET
Abs Immature Granulocytes: 0.03 10*3/uL (ref 0.00–0.07)
Basophils Absolute: 0 10*3/uL (ref 0.0–0.1)
Basophils Relative: 0 %
Eosinophils Absolute: 0.2 10*3/uL (ref 0.0–0.5)
Eosinophils Relative: 3 %
HCT: 38.8 % (ref 36.0–46.0)
Hemoglobin: 12.8 g/dL (ref 12.0–15.0)
Immature Granulocytes: 0 %
Lymphocytes Relative: 20 %
Lymphs Abs: 1.5 10*3/uL (ref 0.7–4.0)
MCH: 33.1 pg (ref 26.0–34.0)
MCHC: 33 g/dL (ref 30.0–36.0)
MCV: 100.3 fL — ABNORMAL HIGH (ref 80.0–100.0)
Monocytes Absolute: 0.6 10*3/uL (ref 0.1–1.0)
Monocytes Relative: 8 %
Neutro Abs: 5.2 10*3/uL (ref 1.7–7.7)
Neutrophils Relative %: 69 %
Platelets: 251 10*3/uL (ref 150–400)
RBC: 3.87 MIL/uL (ref 3.87–5.11)
RDW: 12.5 % (ref 11.5–15.5)
WBC: 7.5 10*3/uL (ref 4.0–10.5)
nRBC: 0 % (ref 0.0–0.2)

## 2021-03-13 MED ORDER — DIPHENHYDRAMINE HCL 25 MG PO CAPS: 25.0000 mg | ORAL_CAPSULE | ORAL | Status: DC

## 2021-03-13 MED ORDER — ACETAMINOPHEN 325 MG PO TABS: 650.0000 mg | ORAL_TABLET | ORAL | Status: DC

## 2021-03-13 MED ORDER — TOCILIZUMAB 400 MG/20ML IV SOLN
8.0000 mg/kg | INTRAVENOUS | Status: DC
  Administered 2021-03-13: 988 mg via INTRAVENOUS
  Filled 2021-03-13: qty 40

## 2021-03-13 NOTE — Progress Notes (Signed)
CBC WNL.   Creatinine remains elevated-1.48 is but is overall stable.   GFR is 38. Please advise the patient to continue to avoid NSAID use.  Total protein remains low but has improved.   Glucose is 117.  LFTs WNL.   Please forward lab work to the patients nephrologist.

## 2021-03-16 ENCOUNTER — Other Ambulatory Visit: Payer: Self-pay | Admitting: *Deleted

## 2021-03-16 MED ORDER — ALBUTEROL SULFATE HFA 108 (90 BASE) MCG/ACT IN AERS: INHALATION_SPRAY | RESPIRATORY_TRACT | 1 refills | Status: DC

## 2021-03-16 MED ORDER — FLUTICASONE PROPIONATE 50 MCG/ACT NA SUSP: NASAL | 1 refills | Status: DC

## 2021-03-16 MED ORDER — ARNUITY ELLIPTA 200 MCG/ACT IN AEPB: INHALATION_SPRAY | RESPIRATORY_TRACT | 1 refills | Status: DC

## 2021-03-21 DIAGNOSIS — Z20822 Contact with and (suspected) exposure to covid-19: Secondary | ICD-10-CM | POA: Diagnosis not present

## 2021-03-23 DIAGNOSIS — Z792 Long term (current) use of antibiotics: Secondary | ICD-10-CM | POA: Diagnosis not present

## 2021-03-23 DIAGNOSIS — R739 Hyperglycemia, unspecified: Secondary | ICD-10-CM | POA: Diagnosis not present

## 2021-03-23 DIAGNOSIS — M069 Rheumatoid arthritis, unspecified: Secondary | ICD-10-CM | POA: Diagnosis not present

## 2021-03-23 DIAGNOSIS — N182 Chronic kidney disease, stage 2 (mild): Secondary | ICD-10-CM | POA: Diagnosis not present

## 2021-03-23 DIAGNOSIS — E785 Hyperlipidemia, unspecified: Secondary | ICD-10-CM | POA: Diagnosis not present

## 2021-03-23 DIAGNOSIS — I10 Essential (primary) hypertension: Secondary | ICD-10-CM | POA: Diagnosis not present

## 2021-03-23 DIAGNOSIS — Z6841 Body Mass Index (BMI) 40.0 and over, adult: Secondary | ICD-10-CM | POA: Diagnosis not present

## 2021-03-23 DIAGNOSIS — R635 Abnormal weight gain: Secondary | ICD-10-CM | POA: Diagnosis not present

## 2021-03-29 ENCOUNTER — Other Ambulatory Visit: Payer: Self-pay

## 2021-03-29 MED ORDER — ARNUITY ELLIPTA 200 MCG/ACT IN AEPB: INHALATION_SPRAY | RESPIRATORY_TRACT | 1 refills | Status: DC

## 2021-03-29 MED ORDER — FLUTICASONE PROPIONATE 50 MCG/ACT NA SUSP: NASAL | 1 refills | Status: DC

## 2021-03-29 MED ORDER — ALBUTEROL SULFATE HFA 108 (90 BASE) MCG/ACT IN AERS: INHALATION_SPRAY | RESPIRATORY_TRACT | 1 refills | Status: DC

## 2021-03-30 DIAGNOSIS — D510 Vitamin B12 deficiency anemia due to intrinsic factor deficiency: Secondary | ICD-10-CM | POA: Diagnosis not present

## 2021-04-10 ENCOUNTER — Ambulatory Visit (HOSPITAL_COMMUNITY)
Admission: RE | Admit: 2021-04-10 | Discharge: 2021-04-10 | Disposition: A | Discharge status: Home / Self Care | Payer: Medicare Other | Source: Ambulatory Visit | Attending: Rheumatology | Admitting: Rheumatology

## 2021-04-10 DIAGNOSIS — M0579 Rheumatoid arthritis with rheumatoid factor of multiple sites without organ or systems involvement: Secondary | ICD-10-CM | POA: Diagnosis not present

## 2021-04-10 MED ORDER — ACETAMINOPHEN 325 MG PO TABS: 650.0000 mg | ORAL_TABLET | ORAL | Status: DC

## 2021-04-10 MED ORDER — TOCILIZUMAB 400 MG/20ML IV SOLN
8.0000 mg/kg | INTRAVENOUS | Status: DC
  Administered 2021-04-10: 988 mg via INTRAVENOUS
  Filled 2021-04-10: qty 40

## 2021-04-10 MED ORDER — DIPHENHYDRAMINE HCL 25 MG PO CAPS: 25.0000 mg | ORAL_CAPSULE | ORAL | Status: DC

## 2021-04-27 DIAGNOSIS — D510 Vitamin B12 deficiency anemia due to intrinsic factor deficiency: Secondary | ICD-10-CM | POA: Diagnosis not present

## 2021-05-01 ENCOUNTER — Other Ambulatory Visit: Payer: Self-pay

## 2021-05-01 MED ORDER — PREDNISONE 5 MG PO TABS: ORAL_TABLET | ORAL | 0 refills | Status: DC

## 2021-05-01 NOTE — Telephone Encounter (Signed)
Patient advised prescription has been sent to the pharmacy.  

## 2021-05-01 NOTE — Telephone Encounter (Signed)
Patient states she is having swelling in her left hand. Was mild for the past few weeks. Patient states over the weekend the swelling has gotten much worse. Patient states her fingers and her wrist are swollen as well. Patient states she is unable to use her hand. Patient is on Actemra infusions. Last infusion on 04/10/2021. Her next infusion is scheduled for 05/08/2021. Patient has been taking a strengthening class at the gym once a week. Patient is requesting a prescription for prednisone. Please advise.  ?

## 2021-05-01 NOTE — Telephone Encounter (Signed)
Patient called stating her left hand is swollen again and requesting a prescription of Prednisone to be sent to [facility] on [address] [facility] in [place] which is a new pharmacy.   ?

## 2021-05-08 ENCOUNTER — Other Ambulatory Visit: Payer: Self-pay | Admitting: Pharmacist

## 2021-05-08 ENCOUNTER — Ambulatory Visit (HOSPITAL_COMMUNITY)
Admission: RE | Admit: 2021-05-08 | Discharge: 2021-05-08 | Disposition: A | Discharge status: Home / Self Care | Payer: Medicare Other | Source: Ambulatory Visit | Attending: Rheumatology | Admitting: Rheumatology

## 2021-05-08 DIAGNOSIS — Z79899 Other long term (current) drug therapy: Secondary | ICD-10-CM | POA: Insufficient documentation

## 2021-05-08 DIAGNOSIS — M0579 Rheumatoid arthritis with rheumatoid factor of multiple sites without organ or systems involvement: Secondary | ICD-10-CM | POA: Insufficient documentation

## 2021-05-08 DIAGNOSIS — Z111 Encounter for screening for respiratory tuberculosis: Secondary | ICD-10-CM | POA: Insufficient documentation

## 2021-05-08 LAB — COMPREHENSIVE METABOLIC PANEL
ALT: 23 U/L (ref 0–44)
AST: 25 U/L (ref 15–41)
Albumin: 3.7 g/dL (ref 3.5–5.0)
Alkaline Phosphatase: 53 U/L (ref 38–126)
Anion gap: 9 (ref 5–15)
BUN: 32 mg/dL — ABNORMAL HIGH (ref 8–23)
CO2: 24 mmol/L (ref 22–32)
Calcium: 9.1 mg/dL (ref 8.9–10.3)
Chloride: 104 mmol/L (ref 98–111)
Creatinine, Ser: 1.47 mg/dL — ABNORMAL HIGH (ref 0.44–1.00)
GFR, Estimated: 38 mL/min — ABNORMAL LOW (ref >60)
Glucose, Bld: 118 mg/dL — ABNORMAL HIGH (ref 70–99)
Potassium: 4.2 mmol/L (ref 3.5–5.1)
Sodium: 137 mmol/L (ref 135–145)
Total Bilirubin: 1.5 mg/dL — ABNORMAL HIGH (ref 0.3–1.2)
Total Protein: 6.3 g/dL — ABNORMAL LOW (ref 6.5–8.1)

## 2021-05-08 LAB — CBC WITH DIFFERENTIAL/PLATELET
Abs Immature Granulocytes: 0.08 10*3/uL — ABNORMAL HIGH (ref 0.00–0.07)
Basophils Absolute: 0 10*3/uL (ref 0.0–0.1)
Basophils Relative: 0 %
Eosinophils Absolute: 0.3 10*3/uL (ref 0.0–0.5)
Eosinophils Relative: 3 %
HCT: 41 % (ref 36.0–46.0)
Hemoglobin: 13.7 g/dL (ref 12.0–15.0)
Immature Granulocytes: 1 %
Lymphocytes Relative: 18 %
Lymphs Abs: 2.2 10*3/uL (ref 0.7–4.0)
MCH: 33 pg (ref 26.0–34.0)
MCHC: 33.4 g/dL (ref 30.0–36.0)
MCV: 98.8 fL (ref 80.0–100.0)
Monocytes Absolute: 0.9 10*3/uL (ref 0.1–1.0)
Monocytes Relative: 7 %
Neutro Abs: 8.8 10*3/uL — ABNORMAL HIGH (ref 1.7–7.7)
Neutrophils Relative %: 71 %
Platelets: 292 10*3/uL (ref 150–400)
RBC: 4.15 MIL/uL (ref 3.87–5.11)
RDW: 12.9 % (ref 11.5–15.5)
WBC: 12.4 10*3/uL — ABNORMAL HIGH (ref 4.0–10.5)
nRBC: 0 % (ref 0.0–0.2)

## 2021-05-08 MED ORDER — ACETAMINOPHEN 325 MG PO TABS: 650.0000 mg | ORAL_TABLET | ORAL | Status: DC

## 2021-05-08 MED ORDER — TOCILIZUMAB 400 MG/20ML IV SOLN
800.0000 mg | INTRAVENOUS | Status: DC
  Administered 2021-05-08: 800 mg via INTRAVENOUS
  Filled 2021-05-08: qty 40

## 2021-05-08 MED ORDER — DIPHENHYDRAMINE HCL 25 MG PO CAPS: 25.0000 mg | ORAL_CAPSULE | ORAL | Status: DC

## 2021-05-08 NOTE — Progress Notes (Signed)
Glucose is mildly elevated, probably not a fasting sample.  Creatinine is elevated and stable, most likely due to diuretics.  White cell count is elevated which could be seen with infection or prednisone use.  Please ask if patient have any symptoms of infection.  Please advise patient to discuss elevated creatinine with her PCP.  No change in treatment advised.

## 2021-05-08 NOTE — Progress Notes (Signed)
Next infusion scheduled for Actemra IV on 06/05/21 and due for updated orders. ?Diagnosis: RA ? ?Dose: '8mg'$ /kg every 4 weeks (1024 mg based on last recorded weight of 127.9kg) ? ?Last Clinic Visit: 03/06/21 ?Next Clinic Visit: 06/06/21 ? ?Last infusion: 05/08/21 ? ?Labs: CBC and CMP on 05/08/21 ?TB Gold: negative on 05/08/21 ? ?Orders placed for Actemra IV x 3 doses along with premedication of acetaminophen and diphenhydramine to be administered 30 minutes before medication infusion. ? ?Standing CBC with diff/platelet and CMP with GFR orders placed to be drawn every 2 months.  Next TB gold due 05/09/22 ? ?Knox Saliva, PharmD, MPH, BCPS ?Clinical Pharmacist (Rheumatology and Pulmonology) ? ?

## 2021-05-10 LAB — QUANTIFERON-TB GOLD PLUS: QuantiFERON-TB Gold Plus: NEGATIVE

## 2021-05-10 LAB — QUANTIFERON-TB GOLD PLUS (RQFGPL)
QuantiFERON Mitogen Value: 4.07 IU/mL
QuantiFERON Nil Value: 0 IU/mL
QuantiFERON TB1 Ag Value: 0 IU/mL
QuantiFERON TB2 Ag Value: 0 IU/mL

## 2021-05-11 NOTE — Progress Notes (Signed)
TB gold negative

## 2021-05-14 DIAGNOSIS — Z20822 Contact with and (suspected) exposure to covid-19: Secondary | ICD-10-CM | POA: Diagnosis not present

## 2021-05-18 DIAGNOSIS — M059 Rheumatoid arthritis with rheumatoid factor, unspecified: Secondary | ICD-10-CM | POA: Diagnosis not present

## 2021-05-18 DIAGNOSIS — I129 Hypertensive chronic kidney disease with stage 1 through stage 4 chronic kidney disease, or unspecified chronic kidney disease: Secondary | ICD-10-CM | POA: Diagnosis not present

## 2021-05-18 DIAGNOSIS — N1832 Chronic kidney disease, stage 3b: Secondary | ICD-10-CM | POA: Diagnosis not present

## 2021-05-21 ENCOUNTER — Other Ambulatory Visit: Payer: Self-pay

## 2021-05-21 MED ORDER — PREDNISONE 5 MG PO TABS: ORAL_TABLET | ORAL | 0 refills | Status: DC

## 2021-05-21 NOTE — Telephone Encounter (Signed)
Ok to extend prednisone taper to 20 mg tapering by 5 mg every 4 days.  We will need to discuss treatment options at her follow up visit if she continues to have recurrent flares requiring prednisone.

## 2021-05-21 NOTE — Telephone Encounter (Signed)
Patient called stating she took Prednisone from 3/8 to 3/15 which decreased the swelling in her left hand, but now it is swelling again.  Patient requested a prescription of Prednisone be sent to [facility] on [address] in [place].  Patient states she has appointment scheduled for 06/06/21 and doesn't want to drive until she gets the swelling under control.   ?

## 2021-05-21 NOTE — Telephone Encounter (Signed)
Patient advised ok to extend prednisone taper to 20 mg tapering by 5 mg every 4 days. Prescription sent to the pharmacy. Patient advised we will need to discuss treatment options at her follow up visit if she continues to have recurrent flares requiring prednisone.  ?

## 2021-05-22 ENCOUNTER — Other Ambulatory Visit: Payer: Self-pay | Admitting: Physician Assistant

## 2021-05-24 DIAGNOSIS — Z20822 Contact with and (suspected) exposure to covid-19: Secondary | ICD-10-CM | POA: Diagnosis not present

## 2021-05-24 NOTE — Progress Notes (Signed)
? ?Office Visit Note ? ?Patient: [name] [name]             ?Date of Birth: 06-20-1950           ?MRN: [id number]             ?PCP: Lowella Dandy, NP ?Referring: Lowella Dandy, NP ?Visit Date: 06/06/2021 ?Occupation: '@GUAROCC'$ @ ? ?Subjective:  ?Swelling in both hands. ? ?History of Present Illness: [name] [name] is a 71 y.o. female with history of seropositive rheumatoid arthritis.  She states about 2 months ago she started having pain and swelling in her bilateral hands and her bilateral feet.  She states initially she was having swelling on her both feet and ankles.  That the swelling moved to her left hand.  She states her left hand became like a balloon.  When she started using her right hand and the right hand started swelling.  She called [place] and received her first prednisone taper and March.  She took the prednisone taper and the symptoms improved and then she had to have another prednisone taper on March 28.  She is finishing that prednisone taper today.  She is still continues to have some discomfort in her both hands.  She states her hands and wrist are still puffy.  None of the other joints are painful.  Prior to this episode she recalls lifting a box of books.  She gets fluid retention in her legs. ? ?Activities of Daily Living:  ?Patient reports morning stiffness for 0 minutes.   ?Patient Reports nocturnal pain.  ?Difficulty dressing/grooming: Reports ?Difficulty climbing stairs: Reports ?Difficulty getting out of chair: Denies ?Difficulty using hands for taps, buttons, cutlery, and/or writing: Denies ? ?Review of Systems  ?Constitutional:  Negative for fatigue.  ?HENT:  Negative for mouth sores, mouth dryness and nose dryness.   ?Eyes:  Positive for dryness. Negative for pain and itching.  ?Respiratory:  Negative for shortness of breath and difficulty breathing.   ?Cardiovascular:  Negative for chest pain and palpitations.  ?Gastrointestinal:  Negative for blood in stool, constipation and diarrhea.   ?Endocrine: Negative for increased urination.  ?Genitourinary:  Negative for difficulty urinating.  ?Musculoskeletal:  Positive for joint pain, joint pain, joint swelling, myalgias and myalgias. Negative for morning stiffness and muscle tenderness.  ?Skin:  Negative for color change, rash and redness.  ?Allergic/Immunologic: Negative for susceptible to infections.  ?Neurological:  Negative for dizziness, numbness, headaches, memory loss and weakness.  ?Hematological:  Negative for bruising/bleeding tendency.  ?Psychiatric/Behavioral:  Negative for confusion.   ? ?PMFS History:  ?Patient Active Problem List  ? Diagnosis Date Noted  ? Dyspnea 11/05/2013  ? Rheumatoid arthritis ([place])   ? Hypertension   ? GERD (gastroesophageal reflux disease)   ? Morbid obesity ([place]) 03/10/2013  ? S/P right THA, AA 03/09/2013  ?  ?Past Medical History:  ?Diagnosis Date  ? Deviated nasal septum   ? right side, cannot breathe out of right septum  ? Episcleritis periodica fugax of both eyes   ? GERD (gastroesophageal reflux disease)   ? Hypertension   ? Kidney disease   ? Pinguecula of left eye   ? Plantar fasciitis   ? Rheumatoid arthritis ([place])   ? oa and ra in arms, shane anderson  ? Spondylitis ([place])   ? Stasis dermatitis   ? UTI (lower urinary tract infection) 03/02/2013  ? treated with keflex for hip surgery on 03/09/13  ?  ?Family History  ?Problem Relation Age of Onset  ?  Heart disease Father   ? Kidney disease Father   ? Heart attack Father   ? Heart disease Brother   ? Cancer Brother   ?     agent orange   ? Rheum arthritis Maternal Aunt   ? Hypertension Mother   ? Alzheimer's disease Mother   ? Heart attack Brother   ? Vascular Disease Brother   ? Kidney disease Brother   ? ?Past Surgical History:  ?Procedure Laterality Date  ? BIOPSY BREAST Left   ? [facility]  ? BREAST BIOPSY Right 11/16/2020  ? COLON BIOPSY  04/2020  ? COLONOSCOPY N/A 04/21/2015  ? DIAGNOSTIC MAMMOGRAM Bilateral 04/05/2016  ? ingrown toenail removed   5-6 yrs ago  ? toenail removal Left   ? TOTAL HIP ARTHROPLASTY Right 03/09/2013  ? Procedure: RIGHT TOTAL HIP ARTHROPLASTY ANTERIOR APPROACH;  Surgeon: Mauri Pole, MD;  Location: [facility];  Service: Orthopedics;  Laterality: Right;  ? ?Social History  ? ?Social History Narrative  ? Not on file  ? ?Immunization History  ?Administered Date(s) Administered  ? Influenza Split 12/26/2012  ? Influenza-Unspecified 11/29/2013, 11/07/2020  ? Moderna Sars-Covid-2 Vaccination 04/01/2019, 04/27/2019, 10/12/2019, 06/22/2020, 11/07/2020  ? Pneumococcal Polysaccharide-23 03/11/2013  ? Zoster Recombinat (Shingrix) 07/08/2017, 12/30/2017  ?  ? ?Objective: ?Vital Signs: BP 128/76 (BP Location: Left Wrist, Patient Position: Sitting, Cuff Size: Normal)   Pulse 60   Ht '5\' 2"'$  (1.575 m)   Wt 278 lb 3.2 oz (126.2 kg)   BMI 50.88 kg/m?   ? ?Physical Exam ?Vitals and nursing note reviewed.  ?Constitutional:   ?   Appearance: She is well-developed.  ?HENT:  ?   Head: Normocephalic and atraumatic.  ?Eyes:  ?   Conjunctiva/sclera: Conjunctivae normal.  ?Cardiovascular:  ?   Rate and Rhythm: Normal rate and regular rhythm.  ?   Heart sounds: Normal heart sounds.  ?Pulmonary:  ?   Effort: Pulmonary effort is normal.  ?   Breath sounds: Normal breath sounds.  ?Abdominal:  ?   General: Bowel sounds are normal.  ?   Palpations: Abdomen is soft.  ?Musculoskeletal:  ?   Cervical back: Normal range of motion.  ?   Right lower leg: Edema present.  ?   Left lower leg: Edema present.  ?Lymphadenopathy:  ?   Cervical: No cervical adenopathy.  ?Skin: ?   General: Skin is warm and dry.  ?   Capillary Refill: Capillary refill takes less than 2 seconds.  ?Neurological:  ?   Mental Status: She is alert and oriented to person, place, and time.  ?Psychiatric:     ?   Behavior: Behavior normal.  ?  ? ?Musculoskeletal Exam: C-spine was in good range of motion.  She likes to raise her right arm up to 90 degrees only.  Left shoulder joint was in full range of  motion without any discomfort.  Elbow joints and wrist joints with good range of motion.  She has synovial thickening over MCP joints but no synovitis was noted.  There was no synovitis over wrist joints.  Hip joints were difficult to assess in the sitting position.  Knee joints in good range of motion.  She has significant pedal edema over bilateral ankles and feet. ? ?CDAI Exam: ?CDAI Score: -- ?Patient Global: --; Provider Global: -- ?Swollen: --; Tender: -- ?Joint Exam 06/06/2021  ? ?No joint exam has been documented for this visit  ? ?There is currently no information documented on the homunculus. Go  to the Rheumatology activity and complete the homunculus joint exam. ? ?Investigation: ?No additional findings. ? ?Imaging: ?No results found. ? ?Recent Labs: ?Lab Results  ?Component Value Date  ? WBC 12.4 (H) 05/08/2021  ? HGB 13.7 05/08/2021  ? PLT 292 05/08/2021  ? NA 137 05/08/2021  ? K 4.2 05/08/2021  ? CL 104 05/08/2021  ? CO2 24 05/08/2021  ? GLUCOSE 118 (H) 05/08/2021  ? BUN 32 (H) 05/08/2021  ? CREATININE 1.47 (H) 05/08/2021  ? BILITOT 1.5 (H) 05/08/2021  ? ALKPHOS 53 05/08/2021  ? AST 25 05/08/2021  ? ALT 23 05/08/2021  ? PROT 6.3 (L) 05/08/2021  ? ALBUMIN 3.7 05/08/2021  ? CALCIUM 9.1 05/08/2021  ? GFRAA 48 (L) 10/28/2019  ? QFTBGOLDPLUS Negative 05/08/2021  ? ? ?Speciality Comments: MTX-SOB, Arava-SOB, Humira-SOB, forgetfullness, Enbrel-Painful injection ?Actemra-03/2014 ? ?Procedures:  ?No procedures performed ?Allergies: Allopurinol, Asmanex (120 metered doses) [mometasone furoate], Guaifenesin & derivatives, Methotrexate derivatives, Methylprednisolone, Monosodium glutamate, Plaquenil [hydroxychloroquine sulfate], Prednisone, Prevnar 13 [pneumococcal 13-val conj vacc], Shingrix [zoster vac recomb adjuvanted], Tetanus toxoids, Baclofen, Ciprofloxacin, Epiceram, Finasteride, Humira [adalimumab], Keratin, Leflunomide, Other, Polysporin [bacitracin-polymyxin b], Rosuvastatin, Vitamin e, Neosporin  [neomycin-bacitracin zn-polymyx], and Sulfa antibiotics  ? ?Assessment / Plan:     ?Visit Diagnoses: Rheumatoid arthritis with rheumatoid factor of multiple sites without organ or systems involvement ([place]) - Dx 20 years

## 2021-05-28 ENCOUNTER — Ambulatory Visit (HOSPITAL_COMMUNITY): Payer: Medicare Other

## 2021-05-28 DIAGNOSIS — D510 Vitamin B12 deficiency anemia due to intrinsic factor deficiency: Secondary | ICD-10-CM | POA: Diagnosis not present

## 2021-05-29 ENCOUNTER — Ambulatory Visit (HOSPITAL_COMMUNITY): Payer: Medicare Other

## 2021-06-05 ENCOUNTER — Ambulatory Visit (HOSPITAL_COMMUNITY)
Admission: RE | Admit: 2021-06-05 | Discharge: 2021-06-05 | Disposition: A | Discharge status: Home / Self Care | Payer: Medicare Other | Source: Ambulatory Visit | Attending: Rheumatology | Admitting: Rheumatology

## 2021-06-05 DIAGNOSIS — M0579 Rheumatoid arthritis with rheumatoid factor of multiple sites without organ or systems involvement: Secondary | ICD-10-CM | POA: Diagnosis not present

## 2021-06-05 MED ORDER — ACETAMINOPHEN 325 MG PO TABS: 650.0000 mg | ORAL_TABLET | ORAL | Status: DC

## 2021-06-05 MED ORDER — TOCILIZUMAB 400 MG/20ML IV SOLN
1020.0000 mg | INTRAVENOUS | Status: DC
  Administered 2021-06-05: 1020 mg via INTRAVENOUS
  Filled 2021-06-05: qty 40

## 2021-06-05 MED ORDER — DIPHENHYDRAMINE HCL 25 MG PO CAPS: 25.0000 mg | ORAL_CAPSULE | ORAL | Status: DC

## 2021-06-06 ENCOUNTER — Encounter: Payer: Self-pay | Admitting: Rheumatology

## 2021-06-06 ENCOUNTER — Ambulatory Visit (INDEPENDENT_AMBULATORY_CARE_PROVIDER_SITE_OTHER): Payer: Medicare Other | Admitting: Rheumatology

## 2021-06-06 ENCOUNTER — Ambulatory Visit: Payer: Medicare Other | Admitting: Rheumatology

## 2021-06-06 VITALS — BP 128/76 | HR 60 | Ht 62.0 in | Wt 278.2 lb

## 2021-06-06 DIAGNOSIS — Z96641 Presence of right artificial hip joint: Secondary | ICD-10-CM | POA: Diagnosis not present

## 2021-06-06 DIAGNOSIS — Z8719 Personal history of other diseases of the digestive system: Secondary | ICD-10-CM

## 2021-06-06 DIAGNOSIS — R7989 Other specified abnormal findings of blood chemistry: Secondary | ICD-10-CM | POA: Diagnosis not present

## 2021-06-06 DIAGNOSIS — Z862 Personal history of diseases of the blood and blood-forming organs and certain disorders involving the immune mechanism: Secondary | ICD-10-CM | POA: Diagnosis not present

## 2021-06-06 DIAGNOSIS — Z79899 Other long term (current) drug therapy: Secondary | ICD-10-CM

## 2021-06-06 DIAGNOSIS — I10 Essential (primary) hypertension: Secondary | ICD-10-CM | POA: Diagnosis not present

## 2021-06-06 DIAGNOSIS — M170 Bilateral primary osteoarthritis of knee: Secondary | ICD-10-CM

## 2021-06-06 DIAGNOSIS — N1831 Chronic kidney disease, stage 3a: Secondary | ICD-10-CM | POA: Diagnosis not present

## 2021-06-06 DIAGNOSIS — R7611 Nonspecific reaction to tuberculin skin test without active tuberculosis: Secondary | ICD-10-CM | POA: Diagnosis not present

## 2021-06-06 DIAGNOSIS — Z8639 Personal history of other endocrine, nutritional and metabolic disease: Secondary | ICD-10-CM | POA: Diagnosis not present

## 2021-06-06 DIAGNOSIS — M0579 Rheumatoid arthritis with rheumatoid factor of multiple sites without organ or systems involvement: Secondary | ICD-10-CM

## 2021-06-06 DIAGNOSIS — Z87891 Personal history of nicotine dependence: Secondary | ICD-10-CM | POA: Diagnosis not present

## 2021-06-06 DIAGNOSIS — Z8261 Family history of arthritis: Secondary | ICD-10-CM

## 2021-06-06 NOTE — Patient Instructions (Addendum)
?  Vaccines ?You are taking a medication(s) that can suppress your immune system.  The following immunizations are recommended: ?Flu annually ?Covid-19  ?Td/Tdap (tetanus, diphtheria, pertussis) every 10 years ?Pneumonia (Prevnar 15 then Pneumovax 23 at least 1 year apart.  Alternatively, can take Prevnar 20 without needing additional dose) ?Shingrix: 2 doses from 4 weeks to 6 months apart ? ?Please check with your PCP to make sure you are up to date.  ? ?If you have signs or symptoms of an infection or start antibiotics: ?First, call your PCP for workup of your infection. ?Hold your medication through the infection, until you complete your antibiotics, and until symptoms resolve if you take the following: ?Injectable medication (Actemra, Benlysta, Cimzia, Cosentyx, Enbrel, Humira, Kevzara, Orencia, Remicade, Simponi, [place], [place], [place]) ?Methotrexate ?Leflunomide Jolee Ewing) ?Mycophenolate (Cellcept) ?Roma Kayser, or Rinvoq  ? ? ?Heart Disease Prevention  ? ?Your inflammatory disease increases your risk of heart disease which includes heart attack, stroke, atrial fibrillation (irregular heartbeats), high blood pressure, heart failure and atherosclerosis (plaque in the arteries).  It is important to reduce your risk by:  ? ?Keep blood pressure, cholesterol, and blood sugar at healthy levels  ? ?Smoking Cessation  ? ?Maintain a healthy weight  ?BMI 20-25  ? ?Eat a healthy diet  ?Plenty of fresh fruit, vegetables, and whole grains  ?Limit saturated fats, foods high in sodium, and added sugars  ?DASH and Mediterranean diet  ? ?Increase physical activity  ?Recommend moderate physically activity for 150 minutes per week/ 30 minutes a day for five days a week These can be broken up into three separate ten-minute sessions during the day.  ? ?Reduce Stress  ?Meditation, slow breathing exercises, yoga, coloring books  ?Dental visits twice a year   ?

## 2021-06-16 DIAGNOSIS — Z20822 Contact with and (suspected) exposure to covid-19: Secondary | ICD-10-CM | POA: Diagnosis not present

## 2021-06-21 DIAGNOSIS — I10 Essential (primary) hypertension: Secondary | ICD-10-CM | POA: Diagnosis not present

## 2021-06-21 DIAGNOSIS — E785 Hyperlipidemia, unspecified: Secondary | ICD-10-CM | POA: Diagnosis not present

## 2021-06-21 DIAGNOSIS — R600 Localized edema: Secondary | ICD-10-CM | POA: Diagnosis not present

## 2021-06-21 DIAGNOSIS — R739 Hyperglycemia, unspecified: Secondary | ICD-10-CM | POA: Diagnosis not present

## 2021-06-21 DIAGNOSIS — M069 Rheumatoid arthritis, unspecified: Secondary | ICD-10-CM | POA: Diagnosis not present

## 2021-06-21 DIAGNOSIS — Z792 Long term (current) use of antibiotics: Secondary | ICD-10-CM | POA: Diagnosis not present

## 2021-06-21 DIAGNOSIS — Z20828 Contact with and (suspected) exposure to other viral communicable diseases: Secondary | ICD-10-CM | POA: Diagnosis not present

## 2021-06-21 DIAGNOSIS — I872 Venous insufficiency (chronic) (peripheral): Secondary | ICD-10-CM | POA: Diagnosis not present

## 2021-06-21 DIAGNOSIS — N182 Chronic kidney disease, stage 2 (mild): Secondary | ICD-10-CM | POA: Diagnosis not present

## 2021-06-25 DIAGNOSIS — Z20822 Contact with and (suspected) exposure to covid-19: Secondary | ICD-10-CM | POA: Diagnosis not present

## 2021-06-27 DIAGNOSIS — D510 Vitamin B12 deficiency anemia due to intrinsic factor deficiency: Secondary | ICD-10-CM | POA: Diagnosis not present

## 2021-06-28 DIAGNOSIS — U071 COVID-19: Secondary | ICD-10-CM | POA: Diagnosis not present

## 2021-06-29 DIAGNOSIS — Z1231 Encounter for screening mammogram for malignant neoplasm of breast: Secondary | ICD-10-CM | POA: Diagnosis not present

## 2021-06-29 DIAGNOSIS — Z23 Encounter for immunization: Secondary | ICD-10-CM | POA: Diagnosis not present

## 2021-06-29 DIAGNOSIS — R928 Other abnormal and inconclusive findings on diagnostic imaging of breast: Secondary | ICD-10-CM | POA: Diagnosis not present

## 2021-07-03 ENCOUNTER — Ambulatory Visit (HOSPITAL_COMMUNITY)
Admission: RE | Admit: 2021-07-03 | Discharge: 2021-07-03 | Disposition: A | Discharge status: Home / Self Care | Payer: Medicare Other | Source: Ambulatory Visit | Attending: Rheumatology | Admitting: Rheumatology

## 2021-07-03 DIAGNOSIS — M0579 Rheumatoid arthritis with rheumatoid factor of multiple sites without organ or systems involvement: Secondary | ICD-10-CM | POA: Diagnosis not present

## 2021-07-03 DIAGNOSIS — Z79899 Other long term (current) drug therapy: Secondary | ICD-10-CM | POA: Insufficient documentation

## 2021-07-03 LAB — CBC WITH DIFFERENTIAL/PLATELET
Abs Immature Granulocytes: 0.04 10*3/uL (ref 0.00–0.07)
Basophils Absolute: 0 10*3/uL (ref 0.0–0.1)
Basophils Relative: 0 %
Eosinophils Absolute: 0.5 10*3/uL (ref 0.0–0.5)
Eosinophils Relative: 6 %
HCT: 37.4 % (ref 36.0–46.0)
Hemoglobin: 12.6 g/dL (ref 12.0–15.0)
Immature Granulocytes: 1 %
Lymphocytes Relative: 18 %
Lymphs Abs: 1.4 10*3/uL (ref 0.7–4.0)
MCH: 33.9 pg (ref 26.0–34.0)
MCHC: 33.7 g/dL (ref 30.0–36.0)
MCV: 100.5 fL — ABNORMAL HIGH (ref 80.0–100.0)
Monocytes Absolute: 0.6 10*3/uL (ref 0.1–1.0)
Monocytes Relative: 7 %
Neutro Abs: 5.5 10*3/uL (ref 1.7–7.7)
Neutrophils Relative %: 68 %
Platelets: 242 10*3/uL (ref 150–400)
RBC: 3.72 MIL/uL — ABNORMAL LOW (ref 3.87–5.11)
RDW: 13.1 % (ref 11.5–15.5)
WBC: 8 10*3/uL (ref 4.0–10.5)
nRBC: 0 % (ref 0.0–0.2)

## 2021-07-03 LAB — COMPREHENSIVE METABOLIC PANEL
ALT: 24 U/L (ref 0–44)
AST: 28 U/L (ref 15–41)
Albumin: 3.5 g/dL (ref 3.5–5.0)
Alkaline Phosphatase: 43 U/L (ref 38–126)
Anion gap: 9 (ref 5–15)
BUN: 43 mg/dL — ABNORMAL HIGH (ref 8–23)
CO2: 23 mmol/L (ref 22–32)
Calcium: 9.5 mg/dL (ref 8.9–10.3)
Chloride: 109 mmol/L (ref 98–111)
Creatinine, Ser: 1.94 mg/dL — ABNORMAL HIGH (ref 0.44–1.00)
GFR, Estimated: 27 mL/min — ABNORMAL LOW (ref >60)
Glucose, Bld: 112 mg/dL — ABNORMAL HIGH (ref 70–99)
Potassium: 4.5 mmol/L (ref 3.5–5.1)
Sodium: 141 mmol/L (ref 135–145)
Total Bilirubin: 1.4 mg/dL — ABNORMAL HIGH (ref 0.3–1.2)
Total Protein: 5.8 g/dL — ABNORMAL LOW (ref 6.5–8.1)

## 2021-07-03 MED ORDER — TOCILIZUMAB 400 MG/20ML IV SOLN
1020.0000 mg | INTRAVENOUS | Status: DC
  Administered 2021-07-03: 1020 mg via INTRAVENOUS
  Filled 2021-07-03: qty 51

## 2021-07-03 MED ORDER — ACETAMINOPHEN 325 MG PO TABS: 650.0000 mg | ORAL_TABLET | ORAL | Status: DC

## 2021-07-03 MED ORDER — DIPHENHYDRAMINE HCL 25 MG PO CAPS: 25.0000 mg | ORAL_CAPSULE | ORAL | Status: DC

## 2021-07-03 NOTE — Progress Notes (Signed)
Creatinine is elevated and has trended up-1.94 and GFR is low-27.  Please clarify if she has been taking any NSAIDs or has had any other medication changes recently?  Please forward results to her nephrologist.  ?Total bilirubin remains borderline elevated but is trending down.  ?RBC count is borderline low.  We will continue to monitor.

## 2021-07-11 ENCOUNTER — Telehealth: Payer: Self-pay | Admitting: Rheumatology
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
## 2021-07-11 NOTE — Telephone Encounter (Signed)
We will discuss at the follow-up visit.

## 2021-07-11 NOTE — Telephone Encounter (Signed)
Patient called the office stating she was seen by her kidney doctor, Dr Hollie Salk, who believes she has Ankylosing Spondylitis based on the symptoms she is experiencing. Patient scheduled an appointment for 07/13/21 and states she would like to be tested to confirm.   ?

## 2021-07-11 NOTE — Telephone Encounter (Signed)
error 

## 2021-07-13 ENCOUNTER — Ambulatory Visit (INDEPENDENT_AMBULATORY_CARE_PROVIDER_SITE_OTHER): Payer: Medicare Other

## 2021-07-13 ENCOUNTER — Ambulatory Visit (INDEPENDENT_AMBULATORY_CARE_PROVIDER_SITE_OTHER): Payer: Medicare Other | Admitting: Rheumatology

## 2021-07-13 ENCOUNTER — Encounter: Payer: Self-pay | Admitting: Rheumatology

## 2021-07-13 VITALS — BP 100/61 | HR 66 | Ht 62.0 in | Wt 278.0 lb

## 2021-07-13 DIAGNOSIS — N1831 Chronic kidney disease, stage 3a: Secondary | ICD-10-CM

## 2021-07-13 DIAGNOSIS — Z8719 Personal history of other diseases of the digestive system: Secondary | ICD-10-CM | POA: Diagnosis not present

## 2021-07-13 DIAGNOSIS — M0579 Rheumatoid arthritis with rheumatoid factor of multiple sites without organ or systems involvement: Secondary | ICD-10-CM

## 2021-07-13 DIAGNOSIS — M5459 Other low back pain: Secondary | ICD-10-CM | POA: Diagnosis not present

## 2021-07-13 DIAGNOSIS — Z79899 Other long term (current) drug therapy: Secondary | ICD-10-CM | POA: Diagnosis not present

## 2021-07-13 DIAGNOSIS — M545 Low back pain, unspecified: Secondary | ICD-10-CM | POA: Diagnosis not present

## 2021-07-13 DIAGNOSIS — Z8639 Personal history of other endocrine, nutritional and metabolic disease: Secondary | ICD-10-CM

## 2021-07-13 DIAGNOSIS — G8929 Other chronic pain: Secondary | ICD-10-CM | POA: Diagnosis not present

## 2021-07-13 DIAGNOSIS — Z96641 Presence of right artificial hip joint: Secondary | ICD-10-CM | POA: Diagnosis not present

## 2021-07-13 DIAGNOSIS — M546 Pain in thoracic spine: Secondary | ICD-10-CM | POA: Diagnosis not present

## 2021-07-13 DIAGNOSIS — Z862 Personal history of diseases of the blood and blood-forming organs and certain disorders involving the immune mechanism: Secondary | ICD-10-CM

## 2021-07-13 DIAGNOSIS — R7611 Nonspecific reaction to tuberculin skin test without active tuberculosis: Secondary | ICD-10-CM | POA: Diagnosis not present

## 2021-07-13 DIAGNOSIS — Z8261 Family history of arthritis: Secondary | ICD-10-CM

## 2021-07-13 DIAGNOSIS — I10 Essential (primary) hypertension: Secondary | ICD-10-CM

## 2021-07-13 DIAGNOSIS — M170 Bilateral primary osteoarthritis of knee: Secondary | ICD-10-CM | POA: Diagnosis not present

## 2021-07-13 DIAGNOSIS — Z87891 Personal history of nicotine dependence: Secondary | ICD-10-CM

## 2021-07-13 DIAGNOSIS — R7989 Other specified abnormal findings of blood chemistry: Secondary | ICD-10-CM

## 2021-07-13 DIAGNOSIS — R918 Other nonspecific abnormal finding of lung field: Secondary | ICD-10-CM

## 2021-07-13 NOTE — Patient Instructions (Signed)

## 2021-07-16 ENCOUNTER — Encounter: Payer: Self-pay | Admitting: Rheumatology

## 2021-07-19 ENCOUNTER — Telehealth: Payer: Self-pay | Admitting: Rheumatology

## 2021-07-19 DIAGNOSIS — R928 Other abnormal and inconclusive findings on diagnostic imaging of breast: Secondary | ICD-10-CM | POA: Diagnosis not present

## 2021-07-19 DIAGNOSIS — N6489 Other specified disorders of breast: Secondary | ICD-10-CM | POA: Diagnosis not present

## 2021-07-19 NOTE — Telephone Encounter (Signed)
I called patient, chest x-ray faxed

## 2021-07-19 NOTE — Telephone Encounter (Signed)
Patient called requesting her chest x-ray orders be faxed to [facility] so she can schedule her appointment.  Fax [phone]

## 2021-07-20 ENCOUNTER — Encounter: Payer: Self-pay | Admitting: Rheumatology

## 2021-07-20 DIAGNOSIS — M546 Pain in thoracic spine: Secondary | ICD-10-CM | POA: Diagnosis not present

## 2021-07-20 DIAGNOSIS — R918 Other nonspecific abnormal finding of lung field: Secondary | ICD-10-CM | POA: Diagnosis not present

## 2021-07-20 DIAGNOSIS — M47814 Spondylosis without myelopathy or radiculopathy, thoracic region: Secondary | ICD-10-CM | POA: Diagnosis not present

## 2021-07-30 ENCOUNTER — Telehealth: Payer: Self-pay | Admitting: Rheumatology

## 2021-07-30 DIAGNOSIS — D510 Vitamin B12 deficiency anemia due to intrinsic factor deficiency: Secondary | ICD-10-CM | POA: Diagnosis not present

## 2021-07-30 NOTE — Telephone Encounter (Signed)
I called patient, patient will call [facility] to have x-ray report faxed

## 2021-07-30 NOTE — Telephone Encounter (Signed)
Patient called stating she had her chest x-ray on 07/20/21 at [facility] and was told she would receive a call from Dr. Estanislado Pandy to discuss her results.  Patient requested a return call.

## 2021-07-31 ENCOUNTER — Ambulatory Visit (HOSPITAL_COMMUNITY)
Admission: RE | Admit: 2021-07-31 | Discharge: 2021-07-31 | Disposition: A | Discharge status: Home / Self Care | Payer: Medicare Other | Source: Ambulatory Visit | Attending: Rheumatology | Admitting: Rheumatology

## 2021-07-31 ENCOUNTER — Other Ambulatory Visit: Payer: Self-pay | Admitting: Pharmacist

## 2021-07-31 DIAGNOSIS — Z79899 Other long term (current) drug therapy: Secondary | ICD-10-CM

## 2021-07-31 DIAGNOSIS — M0579 Rheumatoid arthritis with rheumatoid factor of multiple sites without organ or systems involvement: Secondary | ICD-10-CM

## 2021-07-31 MED ORDER — DIPHENHYDRAMINE HCL 25 MG PO CAPS: 25.0000 mg | ORAL_CAPSULE | ORAL | Status: DC

## 2021-07-31 MED ORDER — TOCILIZUMAB 400 MG/20ML IV SOLN
1020.0000 mg | INTRAVENOUS | Status: DC
  Administered 2021-07-31: 1020 mg via INTRAVENOUS
  Filled 2021-07-31: qty 41

## 2021-07-31 MED ORDER — ACETAMINOPHEN 325 MG PO TABS: 650.0000 mg | ORAL_TABLET | ORAL | Status: DC

## 2021-07-31 NOTE — Progress Notes (Signed)
Next infusion scheduled for Actemra IV on 08/29/21 and due for updated orders. Diagnosis: RA  Dose: '8mg'$ /kg every 4 weeks  Last Clinic Visit: 07/13/21 Next Clinic Visit: 11/12/21  Last infusion: 07/31/21  Labs: 07/03/21 Creatinine is elevated and has trended up-1.94 and GFR is low-27.  Patient states she was given a fluid pill for a few days. Total bilirubin remains borderline elevated but is trending down.  RBC count is borderline low.    TB Gold: negative on 05/08/21   Orders placed for Actemra IV x 3 doses along with premedication of acetaminophen and diphenhydramine to be administered 30 minutes before medication infusion.  Standing CBC with diff/platelet and CMP with GFR orders placed to be drawn every 2 months.  Next TB gold due 05/09/22  Knox Saliva, PharmD, MPH, BCPS, CPP Clinical Pharmacist (Rheumatology and Pulmonology)

## 2021-07-31 NOTE — Telephone Encounter (Signed)
Patient advised we received her chest x-ray and the results are normal. Patient expressed understanding.

## 2021-08-03 DIAGNOSIS — M5451 Vertebrogenic low back pain: Secondary | ICD-10-CM | POA: Diagnosis not present

## 2021-08-13 ENCOUNTER — Encounter: Payer: Self-pay | Admitting: Allergy and Immunology

## 2021-08-13 ENCOUNTER — Ambulatory Visit (INDEPENDENT_AMBULATORY_CARE_PROVIDER_SITE_OTHER): Payer: Medicare Other | Admitting: Allergy and Immunology

## 2021-08-13 VITALS — BP 114/68 | HR 72 | Resp 18

## 2021-08-13 DIAGNOSIS — J3089 Other allergic rhinitis: Secondary | ICD-10-CM

## 2021-08-13 DIAGNOSIS — J4540 Moderate persistent asthma, uncomplicated: Secondary | ICD-10-CM

## 2021-08-13 DIAGNOSIS — M069 Rheumatoid arthritis, unspecified: Secondary | ICD-10-CM

## 2021-08-13 NOTE — Patient Instructions (Signed)
  1.  Continue to Treat and prevent inflammation:   A.  Flonase 1 spray each nostril 3-7 times per week  B.  Arnuity 200 1 inhalation 3-7 times per week  2.  If needed:   A.  Pro-air HFA 1-2 puffs every 4-6 hours if needed  B.  OTC antihistamine  3.  Return to clinic in 12 months or earlier if problem   4. Anti-CD 20 antibody for RA???

## 2021-08-13 NOTE — Progress Notes (Unsigned)
[place] [facility] [place] [facility] [place]   Follow-up Note  Referring Provider: Lowella Dandy, NP Primary Provider: Lowella Dandy, NP Date of Office Visit: 08/13/2021  Subjective:   [name] [name] (DOB: [birth year]) is a 71 y.o. female who returns to the [facility] [place] on 08/13/2021 in re-evaluation of the following:  HPI: [name] [name] returns to this clinic in evaluation of asthma and allergic rhinitis.  I have not seen her in this clinic since 10 August 2020.  Her airway issue has been under excellent control with the use of Flonase and Arnuity 3 times per week.  Rarely does she require short acting bronchodilator and she has not required a systemic steroid or antibiotic for any type of airway issue.  She does not really exercise secondary to musculoskeletal issues.  She is really been having a lot of problems with her rheumatoid arthritis especially her hands even though she is on an immunosuppressive agent.  Allergies as of 08/13/2021       Reactions   Allopurinol Shortness Of Breath   Asmanex (120 Metered Doses) [mometasone Furoate] Shortness Of Breath   Guaifenesin & Derivatives Shortness Of Breath   MUCINEX   Methotrexate Derivatives Shortness Of Breath   Methylprednisolone Shortness Of Breath   Monosodium Glutamate Shortness Of Breath   Plaquenil [hydroxychloroquine Sulfate] Shortness Of Breath   Prednisone Shortness Of Breath   Prevnar 13 [pneumococcal 13-val Conj Vacc] Shortness Of Breath   Shingrix [zoster Vac Recomb Adjuvanted] Shortness Of Breath   Tetanus Toxoids Shortness Of Breath   Baclofen    Confusion, muscle weakness, forgetfulness, hallucinations, loss of appetite, headaches    Ciprofloxacin Other (See Comments)   Breathing and swallowing after two doses of medications   Epiceram    Finasteride    Humira [adalimumab]    SOB. Tolerates Actemra   Keratin    Keratin oil   Leflunomide    SOB   Other    AQUAPHOR   Polysporin  [bacitracin-polymyxin B]    Rosuvastatin    Vitamin E    Vitamin e oil   Neosporin [neomycin-bacitracin Zn-polymyx] Rash   Sulfa Antibiotics Itching, Rash        Medication List    acetaminophen 500 MG tablet Commonly known as: TYLENOL Take 500 mg by mouth as needed.   ACTEMRA IV Inject into the vein every 28 (twenty-eight) days. '8mg'$ /kg every 28 days   albuterol 108 (90 Base) MCG/ACT inhaler Commonly known as: VENTOLIN HFA Can inhale two puffs every four to six hours as needed for cough or wheeze.   Arnuity Ellipta 200 MCG/ACT Aepb Generic drug: Fluticasone Furoate Inhale one puff once daily. Rinse mouth after use.   ASPERCREME LIDOCAINE EX Apply topically.   atenolol 50 MG tablet Commonly known as: TENORMIN Take 50 mg by mouth every evening.   B-12 COMPLIANCE INJECTION IJ Inject 1 Dose as directed every 30 (thirty) days.   BIOFREEZE EX Apply topically.   cephALEXin 500 MG capsule Commonly known as: KEFLEX Take 500 mg by mouth 4 (four) times daily. 1 hour prior to procedure (dental)   diphenhydrAMINE 25 MG tablet Commonly known as: BENADRYL Take 25 mg by mouth every 6 (six) hours as needed for itching or allergies.   ezetimibe 10 MG tablet Commonly known as: ZETIA Take 10 mg by mouth daily.   fluticasone 50 MCG/ACT nasal spray Commonly known as: Flonase Use 1 spray in each nostril once daily   GINGER PO Take  550 mg by mouth every morning.   Iron-Vitamin C 65-125 MG Tabs Take by mouth every morning.   Lecithin 1200 MG Caps Take 1,200 mg by mouth every morning.   levocetirizine 5 MG tablet Commonly known as: XYZAL Take 5 mg by mouth daily as needed for allergies.   lisinopril-hydrochlorothiazide 20-12.5 MG tablet Commonly known as: ZESTORETIC Take 1 tablet by mouth every morning.   MINOXIDIL EX Apply topically in the morning and at bedtime.   multivitamin with minerals Tabs tablet Take 1 tablet by mouth daily.   OVER THE COUNTER  MEDICATION 2 (two) times daily. Lubricating eye drops for dry eyes from BP meds   Red Yeast Rice 600 MG Caps Take 600 mg by mouth 2 (two) times daily.   YJEHUDJSH RELIEF EX Apply topically.   Turmeric Curcumin 500 MG Caps Take 1,000 mg by mouth 2 (two) times daily.   Vascepa 1 g capsule Generic drug: icosapent Ethyl 2 (TWO) CAPSULE TWO TIMES DAILY   white petrolatum Gel Commonly known as: VASELINE Apply 1 application. topically as needed for lip care.    Past Medical History:  Diagnosis Date   Deviated nasal septum    right side, cannot breathe out of right septum   Episcleritis periodica fugax of both eyes    GERD (gastroesophageal reflux disease)    Hypertension    Kidney disease    Pinguecula of left eye    Plantar fasciitis    Rheumatoid arthritis ([place])    oa and ra in arms, shane anderson   Spondylitis ([place])    Stasis dermatitis    UTI (lower urinary tract infection) 03/02/2013   treated with keflex for hip surgery on 03/09/13    Past Surgical History:  Procedure Laterality Date   BIOPSY BREAST Left    [place] Right 11/16/2020   COLON BIOPSY  04/2020   COLONOSCOPY N/A 04/21/2015   DIAGNOSTIC MAMMOGRAM Bilateral 04/05/2016   ingrown toenail removed  5-6 yrs ago   toenail removal Left    TOTAL HIP ARTHROPLASTY Right 03/09/2013   Procedure: RIGHT TOTAL HIP ARTHROPLASTY ANTERIOR APPROACH;  Surgeon: Mauri Pole, MD;  Location: [facility];  Service: Orthopedics;  Laterality: Right;    Review of systems negative except as noted in HPI / PMHx or noted below:  Review of Systems  Constitutional: Negative.   HENT: Negative.    Eyes: Negative.   Respiratory: Negative.    Cardiovascular: Negative.   Gastrointestinal: Negative.   Genitourinary: Negative.   Musculoskeletal: Negative.   Skin: Negative.   Neurological: Negative.   Endo/Heme/Allergies: Negative.   Psychiatric/Behavioral: Negative.       Objective:   Vitals:    08/13/21 1107  BP: 114/68  Pulse: 72  Resp: 18  SpO2: 97%          Physical Exam Constitutional:      Appearance: She is not diaphoretic.  HENT:     Head: Normocephalic.     Right Ear: Tympanic membrane, ear canal and external ear normal.     Left Ear: Tympanic membrane, ear canal and external ear normal.     Nose: Nose normal. No mucosal edema or rhinorrhea.     Mouth/Throat:     Pharynx: Uvula midline. No oropharyngeal exudate.  Eyes:     Conjunctiva/sclera: Conjunctivae normal.  Neck:     Thyroid: No thyromegaly.     Trachea: Trachea normal. No tracheal tenderness or tracheal deviation.  Cardiovascular:  Rate and Rhythm: Normal rate and regular rhythm.     Heart sounds: Normal heart sounds, S1 normal and S2 normal. No murmur heard. Pulmonary:     Effort: No respiratory distress.     Breath sounds: Normal breath sounds. No stridor. No wheezing or rales.  Lymphadenopathy:     Head:     Right side of head: No tonsillar adenopathy.     Left side of head: No tonsillar adenopathy.     Cervical: No cervical adenopathy.  Skin:    Findings: No erythema or rash.     Nails: There is no clubbing.  Neurological:     Mental Status: She is alert.     Diagnostics: none  Assessment and Plan:   1. Asthma, moderate persistent, well-controlled   2. Perennial allergic rhinitis   3. Rheumatoid arthritis involving multiple sites, unspecified whether rheumatoid factor present ([place])     1.  Continue to Treat and prevent inflammation:   A.  Flonase 1 spray each nostril 3-7 times per week  B.  Arnuity 200 1 inhalation 3-7 times per week  2.  If needed:   A.  Pro-air HFA 1-2 puffs every 4-6 hours if needed  B.  OTC antihistamine  3.  Return to clinic in 12 months or earlier if problem   4. Anti-CD 20 antibody for RA???  [name] [name] is doing well from a respiratory standpoint on her current therapy which includes anti-inflammatory agents for both her upper and lower airway.  Her  rheumatoid arthritis is not doing as well and she has failed immunosuppression with her current agents and has failed other immunosuppressive treatment in the past.  I did give her the name of an anti-CD20 antibody for her to get educated on this form of therapy and discussed this form of therapy with her rheumatologist.  Assuming she does well with her airway issue I will see her back in this clinic in 1 year or earlier if there is a problem.  Allena Katz, MD Allergy / [facility] [place]

## 2021-08-14 ENCOUNTER — Encounter: Payer: Self-pay | Admitting: Allergy and Immunology

## 2021-08-17 DIAGNOSIS — Z9181 History of falling: Secondary | ICD-10-CM | POA: Diagnosis not present

## 2021-08-17 DIAGNOSIS — E669 Obesity, unspecified: Secondary | ICD-10-CM | POA: Diagnosis not present

## 2021-08-17 DIAGNOSIS — Z139 Encounter for screening, unspecified: Secondary | ICD-10-CM | POA: Diagnosis not present

## 2021-08-17 DIAGNOSIS — Z0000 Encounter for general adult medical examination without abnormal findings: Secondary | ICD-10-CM | POA: Diagnosis not present

## 2021-08-17 DIAGNOSIS — E785 Hyperlipidemia, unspecified: Secondary | ICD-10-CM | POA: Diagnosis not present

## 2021-08-17 DIAGNOSIS — Z1331 Encounter for screening for depression: Secondary | ICD-10-CM | POA: Diagnosis not present

## 2021-08-21 ENCOUNTER — Other Ambulatory Visit: Payer: Self-pay | Admitting: Rheumatology

## 2021-08-21 NOTE — Telephone Encounter (Signed)
Patient called stating her right hand index and pinky finger are swollen and painful.  Patient requested a prescription of Prednisone to be sent to [facility] in [place].

## 2021-08-22 MED ORDER — PREDNISONE 5 MG PO TABS: ORAL_TABLET | ORAL | 0 refills | Status: DC

## 2021-08-22 NOTE — Telephone Encounter (Signed)
Okay to send a prednisone taper starting at 20 mg and taper by 5 mg every 4 days.  Please discussed that prednisone can cause hypertension, hyperglycemia, weight gain, osteoporosis and increased risk of infection.  If she continues to have a flare after this needs on taper then she should schedule an earlier appointment.

## 2021-08-22 NOTE — Telephone Encounter (Signed)
Patient advised we will send a prednisone taper starting at 20 mg and taper by 5 mg every 4 days.  Patient that prednisone can cause hypertension, hyperglycemia, weight gain, osteoporosis and increased risk of infection.  If she continues to have a flare after this needs on taper then she should schedule an earlier appointment. Patient expressed understanding.

## 2021-08-23 ENCOUNTER — Telehealth: Payer: Self-pay | Admitting: Rheumatology

## 2021-08-23 NOTE — Telephone Encounter (Signed)
Patient called the office does with a few questions for Dr. Estanislado Pandy. Patient states she would like to know if taking prednisone will affect when she is to take Actemra? Patient asks if its ok to use CBD oil since she cant take NSAIDs? Patient states she also know why her right hand was swollen, patient states she stressed it while pulling a cart with heavy objects.

## 2021-08-23 NOTE — Telephone Encounter (Signed)
Patient advised per Dr. Estanislado Pandy, she does not have any recommendations about CBD oil. [employer] of rheumatology do not support use of CBD oil.  It would be patient's personal choice.  Prednisone does not affect Actemra. Patient expressed understanding.

## 2021-08-23 NOTE — Telephone Encounter (Signed)
I have no recommendations about CBD oil.  [employer] of rheumatology do not support use of CBD oil.  It would be patient's personal choice.  Prednisone does not affect Actemra.

## 2021-08-29 ENCOUNTER — Ambulatory Visit (HOSPITAL_COMMUNITY)
Admission: RE | Admit: 2021-08-29 | Discharge: 2021-08-29 | Disposition: A | Discharge status: Home / Self Care | Payer: Medicare Other | Source: Ambulatory Visit | Attending: Rheumatology | Admitting: Rheumatology

## 2021-08-29 DIAGNOSIS — M0579 Rheumatoid arthritis with rheumatoid factor of multiple sites without organ or systems involvement: Secondary | ICD-10-CM | POA: Insufficient documentation

## 2021-08-29 DIAGNOSIS — Z79899 Other long term (current) drug therapy: Secondary | ICD-10-CM | POA: Diagnosis not present

## 2021-08-29 LAB — CBC WITH DIFFERENTIAL/PLATELET
Abs Immature Granulocytes: 0.16 10*3/uL — ABNORMAL HIGH (ref 0.00–0.07)
Basophils Absolute: 0 10*3/uL (ref 0.0–0.1)
Basophils Relative: 0 %
Eosinophils Absolute: 0.2 10*3/uL (ref 0.0–0.5)
Eosinophils Relative: 1 %
HCT: 39.4 % (ref 36.0–46.0)
Hemoglobin: 13.7 g/dL (ref 12.0–15.0)
Immature Granulocytes: 1 %
Lymphocytes Relative: 19 %
Lymphs Abs: 2.5 10*3/uL (ref 0.7–4.0)
MCH: 34.2 pg — ABNORMAL HIGH (ref 26.0–34.0)
MCHC: 34.8 g/dL (ref 30.0–36.0)
MCV: 98.3 fL (ref 80.0–100.0)
Monocytes Absolute: 0.9 10*3/uL (ref 0.1–1.0)
Monocytes Relative: 7 %
Neutro Abs: 9.4 10*3/uL — ABNORMAL HIGH (ref 1.7–7.7)
Neutrophils Relative %: 72 %
Platelets: 330 10*3/uL (ref 150–400)
RBC: 4.01 MIL/uL (ref 3.87–5.11)
RDW: 12.6 % (ref 11.5–15.5)
WBC: 13.1 10*3/uL — ABNORMAL HIGH (ref 4.0–10.5)
nRBC: 0 % (ref 0.0–0.2)

## 2021-08-29 LAB — COMPREHENSIVE METABOLIC PANEL
ALT: 23 U/L (ref 0–44)
AST: 27 U/L (ref 15–41)
Albumin: 3.7 g/dL (ref 3.5–5.0)
Alkaline Phosphatase: 54 U/L (ref 38–126)
Anion gap: 8 (ref 5–15)
BUN: 33 mg/dL — ABNORMAL HIGH (ref 8–23)
CO2: 23 mmol/L (ref 22–32)
Calcium: 9 mg/dL (ref 8.9–10.3)
Chloride: 109 mmol/L (ref 98–111)
Creatinine, Ser: 1.66 mg/dL — ABNORMAL HIGH (ref 0.44–1.00)
GFR, Estimated: 33 mL/min — ABNORMAL LOW (ref >60)
Glucose, Bld: 121 mg/dL — ABNORMAL HIGH (ref 70–99)
Potassium: 4.4 mmol/L (ref 3.5–5.1)
Sodium: 140 mmol/L (ref 135–145)
Total Bilirubin: 1.5 mg/dL — ABNORMAL HIGH (ref 0.3–1.2)
Total Protein: 6.2 g/dL — ABNORMAL LOW (ref 6.5–8.1)

## 2021-08-29 MED ORDER — ACETAMINOPHEN 325 MG PO TABS: 650.0000 mg | ORAL_TABLET | ORAL | Status: DC

## 2021-08-29 MED ORDER — TOCILIZUMAB 400 MG/20ML IV SOLN
8.0000 mg/kg | INTRAVENOUS | Status: DC
  Administered 2021-08-29: 1024 mg via INTRAVENOUS
  Filled 2021-08-29: qty 51.2

## 2021-08-29 MED ORDER — DIPHENHYDRAMINE HCL 25 MG PO CAPS: 25.0000 mg | ORAL_CAPSULE | ORAL | Status: DC

## 2021-08-29 NOTE — Progress Notes (Signed)
CBC and CMP stable.

## 2021-09-05 DIAGNOSIS — D510 Vitamin B12 deficiency anemia due to intrinsic factor deficiency: Secondary | ICD-10-CM | POA: Diagnosis not present

## 2021-09-21 DIAGNOSIS — R739 Hyperglycemia, unspecified: Secondary | ICD-10-CM | POA: Diagnosis not present

## 2021-09-21 DIAGNOSIS — N182 Chronic kidney disease, stage 2 (mild): Secondary | ICD-10-CM | POA: Diagnosis not present

## 2021-09-21 DIAGNOSIS — Z792 Long term (current) use of antibiotics: Secondary | ICD-10-CM | POA: Diagnosis not present

## 2021-09-21 DIAGNOSIS — M069 Rheumatoid arthritis, unspecified: Secondary | ICD-10-CM | POA: Diagnosis not present

## 2021-09-21 DIAGNOSIS — R600 Localized edema: Secondary | ICD-10-CM | POA: Diagnosis not present

## 2021-09-21 DIAGNOSIS — E785 Hyperlipidemia, unspecified: Secondary | ICD-10-CM | POA: Diagnosis not present

## 2021-09-21 DIAGNOSIS — I872 Venous insufficiency (chronic) (peripheral): Secondary | ICD-10-CM | POA: Diagnosis not present

## 2021-09-21 DIAGNOSIS — I10 Essential (primary) hypertension: Secondary | ICD-10-CM | POA: Diagnosis not present

## 2021-09-25 ENCOUNTER — Encounter (HOSPITAL_COMMUNITY)
Admission: RE | Admit: 2021-09-25 | Discharge: 2021-09-25 | Disposition: A | Discharge status: Home / Self Care | Payer: Medicare Other | Source: Ambulatory Visit | Attending: Rheumatology | Admitting: Rheumatology

## 2021-09-25 DIAGNOSIS — M0579 Rheumatoid arthritis with rheumatoid factor of multiple sites without organ or systems involvement: Secondary | ICD-10-CM | POA: Insufficient documentation

## 2021-09-25 MED ORDER — DIPHENHYDRAMINE HCL 25 MG PO CAPS: 25.0000 mg | ORAL_CAPSULE | ORAL | Status: DC

## 2021-09-25 MED ORDER — ACETAMINOPHEN 325 MG PO TABS: 650.0000 mg | ORAL_TABLET | ORAL | Status: DC

## 2021-09-25 MED ORDER — TOCILIZUMAB 400 MG/20ML IV SOLN
8.0000 mg/kg | INTRAVENOUS | Status: DC
  Administered 2021-09-25: 1024 mg via INTRAVENOUS
  Filled 2021-09-25: qty 40

## 2021-10-03 DIAGNOSIS — Z13820 Encounter for screening for osteoporosis: Secondary | ICD-10-CM | POA: Diagnosis not present

## 2021-10-03 DIAGNOSIS — N959 Unspecified menopausal and perimenopausal disorder: Secondary | ICD-10-CM | POA: Diagnosis not present

## 2021-10-10 DIAGNOSIS — D510 Vitamin B12 deficiency anemia due to intrinsic factor deficiency: Secondary | ICD-10-CM | POA: Diagnosis not present

## 2021-10-23 ENCOUNTER — Encounter (HOSPITAL_COMMUNITY)
Admission: RE | Admit: 2021-10-23 | Discharge: 2021-10-23 | Disposition: A | Discharge status: Home / Self Care | Payer: Medicare Other | Source: Ambulatory Visit | Attending: Rheumatology | Admitting: Rheumatology

## 2021-10-23 ENCOUNTER — Other Ambulatory Visit: Payer: Self-pay | Admitting: Pharmacist

## 2021-10-23 DIAGNOSIS — M0579 Rheumatoid arthritis with rheumatoid factor of multiple sites without organ or systems involvement: Secondary | ICD-10-CM

## 2021-10-23 DIAGNOSIS — Z79899 Other long term (current) drug therapy: Secondary | ICD-10-CM

## 2021-10-23 MED ORDER — TOCILIZUMAB 400 MG/20ML IV SOLN
1000.0000 mg | INTRAVENOUS | Status: DC
  Administered 2021-10-23: 1000 mg via INTRAVENOUS
  Filled 2021-10-23: qty 10

## 2021-10-23 MED ORDER — ACETAMINOPHEN 325 MG PO TABS: 650.0000 mg | ORAL_TABLET | ORAL | Status: DC

## 2021-10-23 MED ORDER — DIPHENHYDRAMINE HCL 25 MG PO CAPS: 25.0000 mg | ORAL_CAPSULE | ORAL | Status: DC

## 2021-10-23 NOTE — Progress Notes (Signed)
Next infusion scheduled for Actemra IV on 11/20/21 and due for updated orders. Diagnosis:  Dose: '8mg'$ /kg every 4 weeks (1002 mg based on last recorded weight of 125.2kg)  Last Clinic Visit: 07/13/21 Next Clinic Visit: 11/12/21  Last infusion: 10/23/21  Labs: CBC and CMP on 08/29/21 TB Gold: negative on 05/08/21   Orders placed for Actemra IV x 3 doses along with premedication of acetaminophen and diphenhydramine to be administered 30 minutes before medication infusion.  Standing CBC with diff/platelet and CMP with GFR orders placed to be drawn every 2 months.  Next TB gold due 05/09/22  Knox Saliva, PharmD, MPH, BCPS, CPP Clinical Pharmacist (Rheumatology and Pulmonology)

## 2021-10-30 NOTE — Progress Notes (Unsigned)
Office Visit Note  Patient: Phenix Grein             Date of Birth: Feb 21, 1951           MRN: [id number]             PCP: Lowella Dandy, NP Referring: Lowella Dandy, NP Visit Date: 11/12/2021 Occupation: @GUAROCC @  Subjective:  Medication monitoring   History of Present Illness: Yasmen Cortner is a 71 y.o. female with history of seropositive rheumatoid arthritis and osteoarthritis.  She remains on Actemra 8 mg/kg IV infusions q4wk. Her last infusion was on 10/23/21.  She continues to tolerate actemra without any side effects.  She denies any signs or symptoms of a rheumatoid arthritis flare.  She continues to experience intermittent pain and stiffness in both hands and typically applies ice for symptomatic relief.  She continues to use a cane to assist with ambulation and occasionally a walker especially at night in her home.  She denies any recent falls.  She has a follow-up visit scheduled with her podiatrist on Thursday for further evaluation due to some increased instability in her left ankle and foot.  She has also noticed increased edema which has made it difficult to wear certain shoes.  She has had difficulty going to her exercise classes due to being unable to wear shoes some days.  She denies any recent or recurrent infections.   She denies any new medical conditions.  She had an updated DEXA ordered by PCP on 10/03/21.     Activities of Daily Living:  Patient reports morning stiffness for a few minutes.   Patient Denies nocturnal pain.  Difficulty dressing/grooming: Denies Difficulty climbing stairs: Reports Difficulty getting out of chair: Reports Difficulty using hands for taps, buttons, cutlery, and/or writing: Denies  Review of Systems  Constitutional:  Negative for fatigue.  HENT:  Negative for mouth sores and mouth dryness.   Eyes:  Positive for dryness.  Respiratory:  Negative for shortness of breath.   Cardiovascular:  Negative for chest pain and palpitations.   Gastrointestinal:  Negative for blood in stool, constipation and diarrhea.  Endocrine: Negative for increased urination.  Genitourinary:  Negative for involuntary urination.  Musculoskeletal:  Positive for joint pain, joint pain, joint swelling, myalgias, morning stiffness, muscle tenderness and myalgias. Negative for gait problem and muscle weakness.  Skin:  Negative for color change, rash, hair loss and sensitivity to sunlight.  Allergic/Immunologic: Negative for susceptible to infections.  Neurological:  Negative for dizziness and headaches.  Hematological:  Negative for swollen glands.  Psychiatric/Behavioral:  Negative for depressed mood and sleep disturbance. The patient is not nervous/anxious.     PMFS History:  Patient Active Problem List   Diagnosis Date Noted   Dyspnea 11/05/2013   Rheumatoid arthritis ([place])    Hypertension    GERD (gastroesophageal reflux disease)    Morbid obesity ([place]) 03/10/2013   S/P right THA, AA 03/09/2013    Past Medical History:  Diagnosis Date   Deviated nasal septum    right side, cannot breathe out of right septum   Episcleritis periodica fugax of both eyes    GERD (gastroesophageal reflux disease)    Hypertension    Kidney disease    Pinguecula of left eye    Plantar fasciitis    Rheumatoid arthritis ([place])    oa and ra in arms, shane anderson   Spondylitis ([place])    Stasis dermatitis    UTI (lower urinary tract infection) 03/02/2013  treated with keflex for hip surgery on 03/09/13    Family History  Problem Relation Age of Onset   Heart disease Father    Kidney disease Father    Heart attack Father    Heart disease Brother    Cancer Brother        agent orange    Rheum arthritis Maternal Aunt    Hypertension Mother    Alzheimer's disease Mother    Heart attack Brother    Vascular Disease Brother    Kidney disease Brother    Past Surgical History:  Procedure Laterality Date   BIOPSY BREAST Left    [facility]    BREAST BIOPSY Right 11/16/2020   COLON BIOPSY  04/2020   COLONOSCOPY N/A 04/21/2015   DIAGNOSTIC MAMMOGRAM Bilateral 04/05/2016   ingrown toenail removed  5-6 yrs ago   toenail removal Left    TOTAL HIP ARTHROPLASTY Right 03/09/2013   Procedure: RIGHT TOTAL HIP ARTHROPLASTY ANTERIOR APPROACH;  Surgeon: Mauri Pole, MD;  Location: [facility];  Service: Orthopedics;  Laterality: Right;   Social History   Social History Narrative   Not on file   Immunization History  Administered Date(s) Administered   Influenza Split 12/26/2012   Influenza-Unspecified 11/29/2013, 11/07/2020   Moderna Sars-Covid-2 Vaccination 04/01/2019, 04/27/2019, 10/12/2019, 06/22/2020, 11/07/2020   Pneumococcal Polysaccharide-23 03/11/2013   Zoster Recombinat (Shingrix) 07/08/2017, 12/30/2017     Objective: Vital Signs: BP 131/83 (BP Location: Left Wrist, Patient Position: Sitting, Cuff Size: Normal)   Pulse 66   Resp 17   Ht $R'5\' 2"'ed$  (1.575 m)   Wt 274 lb 12.8 oz (124.6 kg)   BMI 50.26 kg/m    Physical Exam Vitals and nursing note reviewed.  Constitutional:      Appearance: She is well-developed.  HENT:     Head: Normocephalic and atraumatic.  Eyes:     Conjunctiva/sclera: Conjunctivae normal.  Cardiovascular:     Rate and Rhythm: Normal rate and regular rhythm.     Heart sounds: Normal heart sounds.  Pulmonary:     Effort: Pulmonary effort is normal.     Breath sounds: Normal breath sounds.  Abdominal:     General: Bowel sounds are normal.     Palpations: Abdomen is soft.  Musculoskeletal:     Cervical back: Normal range of motion.  Skin:    General: Skin is warm and dry.     Capillary Refill: Capillary refill takes less than 2 seconds.  Neurological:     Mental Status: She is alert and oriented to person, place, and time.  Psychiatric:        Behavior: Behavior normal.      Musculoskeletal Exam: Patient remained seated during the examination.  C-spine has good ROM.  No midline spinal  tenderness.  She has some paraspinal muscle tenderness especially in the thoracic region on examination today.  Shoulder joints have good ROM.  Elbow joints have good ROM with no tenderness or joint swelling.  Wrist joints have good range of motion with some synovial thickening but no tenderness or synovitis.  Some synovial thickening over MCP joints but no synovitis noted.  Hip joints difficult to assess in seated position.  Both knee joints have good range of motion with no warmth or effusion.  Pedal edema noted bilaterally.  CDAI Exam: CDAI Score: 0.2  Patient Global: 1 mm; Provider Global: 1 mm Swollen: 0 ; Tender: 0  Joint Exam 11/12/2021   No joint exam has been documented for this  visit   There is currently no information documented on the homunculus. Go to the Rheumatology activity and complete the homunculus joint exam.  Investigation: No additional findings.  Imaging: No results found.  Recent Labs: Lab Results  Component Value Date   WBC 13.1 (H) 08/29/2021   HGB 13.7 08/29/2021   PLT 330 08/29/2021   NA 140 08/29/2021   K 4.4 08/29/2021   CL 109 08/29/2021   CO2 23 08/29/2021   GLUCOSE 121 (H) 08/29/2021   BUN 33 (H) 08/29/2021   CREATININE 1.66 (H) 08/29/2021   BILITOT 1.5 (H) 08/29/2021   ALKPHOS 54 08/29/2021   AST 27 08/29/2021   ALT 23 08/29/2021   PROT 6.2 (L) 08/29/2021   ALBUMIN 3.7 08/29/2021   CALCIUM 9.0 08/29/2021   GFRAA 48 (L) 10/28/2019   QFTBGOLDPLUS Negative 05/08/2021    Speciality Comments: HCQ-SOB, MTX-SOB, Arava-SOB, Humira-SOB, forgetfullness, Enbrel-Painful injection Actemra-03/2014  Procedures:  No procedures performed Allergies: Allopurinol, Asmanex (120 metered doses) [mometasone furoate], Guaifenesin & derivatives, Methotrexate derivatives, Methylprednisolone, Monosodium glutamate, Plaquenil [hydroxychloroquine sulfate], Prednisone, Prevnar 13 [pneumococcal 13-val conj vacc], Shingrix [zoster vac recomb adjuvanted], Tetanus  toxoids, Baclofen, Ciprofloxacin, Epiceram, Finasteride, Humira [adalimumab], Keratin, Leflunomide, Other, Polysporin [bacitracin-polymyxin b], Rosuvastatin, Vitamin e, Neosporin [neomycin-bacitracin zn-polymyx], and Sulfa antibiotics  10/03/21, PCP for DEXA     Assessment / Plan:     Visit Diagnoses: Rheumatoid arthritis with rheumatoid factor of multiple sites without organ or systems involvement ([place]) - Dx 20 years ago, Followed by Dr. Dossie Der. 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 ESR 18, CRP<1: She has no synovitis on examination today.  Overall her rheumatoid arthritis remains well controlled on Actemra 8 mg/kg IV infusions every 4 weeks.  Her most recent infusion was on 10/23/2021.  She continues to tolerate Actemra without any side effects or recurrent infections.  She experiences intermittent pain and stiffness in both hands and applies ice topically as needed.  No medication changes will be made at this time.  She was advised to notify us if she develops signs or symptoms of a flare.  She will follow-up in the office in 5 months or sooner if needed.  High risk medication use - Actemra 8 mg/kg IV infusions q4wk (started in 2016).  Previous treatment include:HCQ-SOB, MTX-SOB, Arava-SOB, Humira-SOB, forgetfullness, Enbrel-Painful inj CBC and CMP updated on 08/29/21. She will continue to have updated lab work with infusions.  TB gold negative on 05/08/21.  She has not had any recent or recurrent infections.  Discussed the importance of holding actemra if she develops signs or symptoms of an infection to resume once the infection has completely cleared.   Elevated serum creatinine - Followed by Dr. Hollie Salk at [facility].  She is aware that she should avoid the use of NSAIDs. Creatinine was 1.66 and GFR was 33 on 08/29/2021.  Positive TB test: TB Gold negative on 05/08/2021 and will continue to be monitored yearly.  Status post total hip replacement, right: Doing well.  Good range of motion with no discomfort at this  time.  Primary osteoarthritis of both knees: She has good range of motion of both knee joints on examination today.  No warmth or effusion was noted.  Pain in thoracic spine: She has no midline spinal tenderness on examination today.  She has some paraspinal muscle tenderness in the thoracic region.  Chronic bilateral low back pain without sciatica - X-rays were consistent with multilevel spondylosis and facet joint arthropathy.  Other medical conditions are listed as follows:  History of gastroesophageal  reflux (GERD)  Essential hypertension: Blood pressure was 131/83 today in the office.  History of hyperlipidemia  History of anemia  Stage 3a chronic kidney disease ([place]) - She is followed by nephrologist.  Patient is aware that she should avoid the use of oral NSAIDs.  Family history of rheumatoid arthritis  Former smoker  Orders: No orders of the defined types were placed in this encounter.  No orders of the defined types were placed in this encounter.     Follow-Up Instructions: Return in about 5 months (around 04/14/2022) for Rheumatoid arthritis, Osteoarthritis.   Ofilia Neas, PA-C  Note - This record has been created using Dragon software.  Chart creation errors have been sought, but may not always  have been located. Such creation errors do not reflect on  the standard of medical care.

## 2021-11-12 ENCOUNTER — Ambulatory Visit: Payer: Medicare Other | Attending: Physician Assistant | Admitting: Physician Assistant

## 2021-11-12 ENCOUNTER — Encounter: Payer: Self-pay | Admitting: Physician Assistant

## 2021-11-12 VITALS — BP 131/83 | HR 66 | Resp 17 | Ht 62.0 in | Wt 274.8 lb

## 2021-11-12 DIAGNOSIS — M545 Low back pain, unspecified: Secondary | ICD-10-CM | POA: Diagnosis not present

## 2021-11-12 DIAGNOSIS — M0579 Rheumatoid arthritis with rheumatoid factor of multiple sites without organ or systems involvement: Secondary | ICD-10-CM

## 2021-11-12 DIAGNOSIS — Z79899 Other long term (current) drug therapy: Secondary | ICD-10-CM

## 2021-11-12 DIAGNOSIS — Z8639 Personal history of other endocrine, nutritional and metabolic disease: Secondary | ICD-10-CM

## 2021-11-12 DIAGNOSIS — Z8719 Personal history of other diseases of the digestive system: Secondary | ICD-10-CM | POA: Diagnosis not present

## 2021-11-12 DIAGNOSIS — Z96641 Presence of right artificial hip joint: Secondary | ICD-10-CM

## 2021-11-12 DIAGNOSIS — M170 Bilateral primary osteoarthritis of knee: Secondary | ICD-10-CM

## 2021-11-12 DIAGNOSIS — R7989 Other specified abnormal findings of blood chemistry: Secondary | ICD-10-CM

## 2021-11-12 DIAGNOSIS — Z862 Personal history of diseases of the blood and blood-forming organs and certain disorders involving the immune mechanism: Secondary | ICD-10-CM | POA: Diagnosis not present

## 2021-11-12 DIAGNOSIS — Z87891 Personal history of nicotine dependence: Secondary | ICD-10-CM | POA: Diagnosis not present

## 2021-11-12 DIAGNOSIS — I10 Essential (primary) hypertension: Secondary | ICD-10-CM | POA: Diagnosis not present

## 2021-11-12 DIAGNOSIS — R7611 Nonspecific reaction to tuberculin skin test without active tuberculosis: Secondary | ICD-10-CM

## 2021-11-12 DIAGNOSIS — G8929 Other chronic pain: Secondary | ICD-10-CM

## 2021-11-12 DIAGNOSIS — N1831 Chronic kidney disease, stage 3a: Secondary | ICD-10-CM

## 2021-11-12 DIAGNOSIS — Z8261 Family history of arthritis: Secondary | ICD-10-CM | POA: Diagnosis not present

## 2021-11-12 DIAGNOSIS — M546 Pain in thoracic spine: Secondary | ICD-10-CM

## 2021-11-13 DIAGNOSIS — D510 Vitamin B12 deficiency anemia due to intrinsic factor deficiency: Secondary | ICD-10-CM | POA: Diagnosis not present

## 2021-11-13 DIAGNOSIS — Z23 Encounter for immunization: Secondary | ICD-10-CM | POA: Diagnosis not present

## 2021-11-15 DIAGNOSIS — M21172 Varus deformity, not elsewhere classified, left ankle: Secondary | ICD-10-CM | POA: Diagnosis not present

## 2021-11-20 ENCOUNTER — Ambulatory Visit (HOSPITAL_COMMUNITY)
Admission: RE | Admit: 2021-11-20 | Discharge: 2021-11-20 | Disposition: A | Discharge status: Home / Self Care | Payer: Medicare Other | Source: Ambulatory Visit | Attending: Rheumatology | Admitting: Rheumatology

## 2021-11-20 ENCOUNTER — Telehealth: Payer: Self-pay | Admitting: *Deleted

## 2021-11-20 DIAGNOSIS — Z79899 Other long term (current) drug therapy: Secondary | ICD-10-CM | POA: Insufficient documentation

## 2021-11-20 DIAGNOSIS — M0579 Rheumatoid arthritis with rheumatoid factor of multiple sites without organ or systems involvement: Secondary | ICD-10-CM | POA: Insufficient documentation

## 2021-11-20 LAB — CBC WITH DIFFERENTIAL/PLATELET
Abs Immature Granulocytes: 0.02 10*3/uL (ref 0.00–0.07)
Basophils Absolute: 0 10*3/uL (ref 0.0–0.1)
Basophils Relative: 0 %
Eosinophils Absolute: 0.2 10*3/uL (ref 0.0–0.5)
Eosinophils Relative: 3 %
HCT: 37.5 % (ref 36.0–46.0)
Hemoglobin: 12.6 g/dL (ref 12.0–15.0)
Immature Granulocytes: 0 %
Lymphocytes Relative: 18 %
Lymphs Abs: 1.4 10*3/uL (ref 0.7–4.0)
MCH: 33.3 pg (ref 26.0–34.0)
MCHC: 33.6 g/dL (ref 30.0–36.0)
MCV: 99.2 fL (ref 80.0–100.0)
Monocytes Absolute: 0.5 10*3/uL (ref 0.1–1.0)
Monocytes Relative: 7 %
Neutro Abs: 5.4 10*3/uL (ref 1.7–7.7)
Neutrophils Relative %: 72 %
Platelets: 235 10*3/uL (ref 150–400)
RBC: 3.78 MIL/uL — ABNORMAL LOW (ref 3.87–5.11)
RDW: 13.1 % (ref 11.5–15.5)
WBC: 7.6 10*3/uL (ref 4.0–10.5)
nRBC: 0 % (ref 0.0–0.2)

## 2021-11-20 LAB — COMPREHENSIVE METABOLIC PANEL
ALT: 22 U/L (ref 0–44)
AST: 27 U/L (ref 15–41)
Albumin: 3.7 g/dL (ref 3.5–5.0)
Alkaline Phosphatase: 46 U/L (ref 38–126)
Anion gap: 5 (ref 5–15)
BUN: 28 mg/dL — ABNORMAL HIGH (ref 8–23)
CO2: 26 mmol/L (ref 22–32)
Calcium: 8.9 mg/dL (ref 8.9–10.3)
Chloride: 108 mmol/L (ref 98–111)
Creatinine, Ser: 1.59 mg/dL — ABNORMAL HIGH (ref 0.44–1.00)
GFR, Estimated: 35 mL/min — ABNORMAL LOW (ref >60)
Glucose, Bld: 117 mg/dL — ABNORMAL HIGH (ref 70–99)
Potassium: 4.2 mmol/L (ref 3.5–5.1)
Sodium: 139 mmol/L (ref 135–145)
Total Bilirubin: 1.6 mg/dL — ABNORMAL HIGH (ref 0.3–1.2)
Total Protein: 5.9 g/dL — ABNORMAL LOW (ref 6.5–8.1)

## 2021-11-20 MED ORDER — ACETAMINOPHEN 325 MG PO TABS: 650.0000 mg | ORAL_TABLET | ORAL | Status: DC

## 2021-11-20 MED ORDER — DIPHENHYDRAMINE HCL 25 MG PO CAPS: 25.0000 mg | ORAL_CAPSULE | ORAL | Status: DC

## 2021-11-20 MED ORDER — TOCILIZUMAB 400 MG/20ML IV SOLN
8.0000 mg/kg | INTRAVENOUS | Status: DC
  Administered 2021-11-20: 1002 mg via INTRAVENOUS
  Filled 2021-11-20: qty 40.1

## 2021-11-20 MED ORDER — TOCILIZUMAB 400 MG/20ML IV SOLN: 1000.0000 mg | INTRAVENOUS | Status: DC

## 2021-11-20 NOTE — Telephone Encounter (Signed)
Received DEXA results from [facility].  Date of Scan: 10/03/2021  Lowest T-score:-0.6  BMD:0.827  Lowest site measured:Left Forearm  DX: WNL  Significant changes in BMD and site measured (5% and above):n/a  Current Regimen:n/a  Recommendation:WNL  Reviewed by:Dr. Bo Merino   Next Appointment:  04/17/2022

## 2021-11-20 NOTE — Progress Notes (Signed)
CBC stable.  Glucose is 117. Creatinine is elevated 1.59-trending down and GFR is low at 35-stable. avoid NSAIDs.   Please forward results to nephrologist. Total potassium remains low and has trended down slightly.

## 2021-11-26 ENCOUNTER — Other Ambulatory Visit: Payer: Self-pay | Admitting: Rheumatology

## 2021-11-26 MED ORDER — PREDNISONE 5 MG PO TABS: ORAL_TABLET | ORAL | 0 refills | Status: DC

## 2021-11-26 NOTE — Telephone Encounter (Signed)
Ok to send in prednisone 20 mg tapering by 5 mg every 4 days. Avoid the use of NSAIDs while taking prednisone.   Take prednisone in the morning with food. Please advise the patient to notify us if her symptoms persist or worsen after completing the prednisone taper.

## 2021-11-26 NOTE — Telephone Encounter (Signed)
Patient called stating her left arm from her shoulder to her fingertips is swollen and painful.  Patient states she is having difficulty straightening her elbow.  Patient states she has tried ice, voltaren gel, and lidocain without any relief.  Patient states she cannot schedule an appointment because she cannot drive and is not sure what else to do.  Patient requested a return call.

## 2021-11-26 NOTE — Telephone Encounter (Signed)
Patient advised a prescription for prednisone 20 mg tapering by 5 mg every 4 days. Avoid the use of NSAIDs while taking prednisone.   Take prednisone in the morning with food. Patient advised to notify us if her symptoms persist or worsen after completing the prednisone taper.

## 2021-12-12 DIAGNOSIS — Z23 Encounter for immunization: Secondary | ICD-10-CM | POA: Diagnosis not present

## 2021-12-17 ENCOUNTER — Other Ambulatory Visit: Payer: Self-pay | Admitting: Rheumatology

## 2021-12-17 MED ORDER — PREDNISONE 5 MG PO TABS: ORAL_TABLET | ORAL | 0 refills | Status: DC

## 2021-12-17 NOTE — Telephone Encounter (Signed)
Email sent to infusion center pharmacy team for advisement on an infusion center located in [place]. Infusion center with [facility] does not take external provider infusion orders. Will f/u  Knox Saliva, PharmD, MPH, BCPS, CPP Clinical Pharmacist (Rheumatology and Pulmonology)

## 2021-12-17 NOTE — Telephone Encounter (Signed)
Please clarify if she has missed an infusion recently? I am unsure if there is an infusion center in [place] but please clarify with [facility].    Arthritis compression close may be difficult for her to use while she is having a flare since they may be difficult to get on and off.  If she continues to have recurrent flares we will need to discuss other treatment options.  Her last prednisone taper was sent to the pharmacy on 11/26/2021.  We do not recommend frequent or long-term use of prednisone due to potential adverse effects. Okay to send in prednisone 20 mg tapering by 5 mg every 4 days.  Take prednisone in the morning with food and avoid the use of NSAIDs.  If her symptoms persist or worsen I would recommend an appointment with Dr. Estanislado Pandy to discuss treatment options or a referral for a second opinion.

## 2021-12-17 NOTE — Telephone Encounter (Signed)
Patient states she has not recently missed an infusion. Patient states it was due tomorrow but has it rescheduled for next week, 12/25/2021.   Patient advised we are unsure if there is an infusion center in [place] but will clarify with [facility].     Patient advised arthritis compression close may be difficult for her to use while she is having a flare since they may be difficult to get on and off.   Patient advised if she continues to have recurrent flares we will need to discuss other treatment options.  Her last prednisone taper was sent to the pharmacy on 11/26/2021.  We do not recommend frequent or long-term use of prednisone due to potential adverse effects. Okay to send in prednisone 20 mg tapering by 5 mg every 4 days.  Take prednisone in the morning with food and avoid the use of NSAIDs.  If her symptoms persist or worsen I would recommend an appointment with Dr. Estanislado Pandy to discuss treatment options or a referral for a second opinion.  Patient expressed understanding.   Patient would like to know once she gets the flare in her hands under control would you recommend compression gloves and how would you recommend she wear them.

## 2021-12-17 NOTE — Telephone Encounter (Signed)
Patient called the office stating left hand is back to normal but now her right hand is swollen and painful. Patient states she has tried ice and Voltaren gel with no relief. Patient requests prednisone.  Patient sates she has compression gloves and wants to know how to use them.  Patient states she is having a hard time coming out to [facility] to get her infusions and wants to get them in [place].

## 2021-12-18 ENCOUNTER — Inpatient Hospital Stay (HOSPITAL_COMMUNITY): Admission: RE | Admit: 2021-12-18 | Payer: Medicare Other | Source: Ambulatory Visit

## 2021-12-19 NOTE — Telephone Encounter (Signed)
Received confirmation from [facility] this morning that they do not take infusion referrals from non-oncology providers. [facility] in [place] also does not take orders from external providers. Unfortunately, she will have to continue to receive at [facility], PharmD, MPH, BCPS, CPP Clinical Pharmacist (Rheumatology and Pulmonology)

## 2021-12-19 NOTE — Telephone Encounter (Signed)
Patient advised ok to use compression gloves if she thinks she will be able to get them on and off. She can wear them at night or during the day. Patient advised she will have to continue to received her infusions at [facility]. Patient expressed understanding.

## 2021-12-19 NOTE — Telephone Encounter (Signed)
Ok to use compression gloves if she thinks she will be able to get them on and off. She can wear them at night or during the day

## 2021-12-24 DIAGNOSIS — M069 Rheumatoid arthritis, unspecified: Secondary | ICD-10-CM | POA: Diagnosis not present

## 2021-12-24 DIAGNOSIS — Z792 Long term (current) use of antibiotics: Secondary | ICD-10-CM | POA: Diagnosis not present

## 2021-12-24 DIAGNOSIS — E785 Hyperlipidemia, unspecified: Secondary | ICD-10-CM | POA: Diagnosis not present

## 2021-12-24 DIAGNOSIS — I10 Essential (primary) hypertension: Secondary | ICD-10-CM | POA: Diagnosis not present

## 2021-12-24 DIAGNOSIS — N182 Chronic kidney disease, stage 2 (mild): Secondary | ICD-10-CM | POA: Diagnosis not present

## 2021-12-24 DIAGNOSIS — D510 Vitamin B12 deficiency anemia due to intrinsic factor deficiency: Secondary | ICD-10-CM | POA: Diagnosis not present

## 2021-12-24 DIAGNOSIS — I872 Venous insufficiency (chronic) (peripheral): Secondary | ICD-10-CM | POA: Diagnosis not present

## 2021-12-24 DIAGNOSIS — R739 Hyperglycemia, unspecified: Secondary | ICD-10-CM | POA: Diagnosis not present

## 2021-12-25 ENCOUNTER — Ambulatory Visit (HOSPITAL_COMMUNITY)
Admission: RE | Admit: 2021-12-25 | Discharge: 2021-12-25 | Disposition: A | Discharge status: Home / Self Care | Payer: Medicare Other | Source: Ambulatory Visit | Attending: Rheumatology | Admitting: Rheumatology

## 2021-12-25 DIAGNOSIS — M0579 Rheumatoid arthritis with rheumatoid factor of multiple sites without organ or systems involvement: Secondary | ICD-10-CM | POA: Insufficient documentation

## 2021-12-25 MED ORDER — DIPHENHYDRAMINE HCL 25 MG PO CAPS: 25.0000 mg | ORAL_CAPSULE | ORAL | Status: DC

## 2021-12-25 MED ORDER — TOCILIZUMAB 400 MG/20ML IV SOLN
1000.0000 mg | INTRAVENOUS | Status: DC
  Administered 2021-12-25: 1000 mg via INTRAVENOUS
  Filled 2021-12-25: qty 40

## 2021-12-25 MED ORDER — ACETAMINOPHEN 325 MG PO TABS: 650.0000 mg | ORAL_TABLET | ORAL | Status: DC

## 2022-01-15 ENCOUNTER — Encounter (HOSPITAL_COMMUNITY): Payer: Medicare Other

## 2022-01-22 ENCOUNTER — Ambulatory Visit (HOSPITAL_COMMUNITY)
Admission: RE | Admit: 2022-01-22 | Discharge: 2022-01-22 | Disposition: A | Discharge status: Home / Self Care | Payer: Medicare Other | Source: Ambulatory Visit | Attending: Rheumatology | Admitting: Rheumatology

## 2022-01-22 ENCOUNTER — Other Ambulatory Visit: Payer: Self-pay | Admitting: Pharmacist

## 2022-01-22 DIAGNOSIS — M0579 Rheumatoid arthritis with rheumatoid factor of multiple sites without organ or systems involvement: Secondary | ICD-10-CM | POA: Diagnosis not present

## 2022-01-22 DIAGNOSIS — Z79899 Other long term (current) drug therapy: Secondary | ICD-10-CM | POA: Diagnosis not present

## 2022-01-22 LAB — COMPREHENSIVE METABOLIC PANEL
ALT: 21 U/L (ref 0–44)
AST: 29 U/L (ref 15–41)
Albumin: 3.6 g/dL (ref 3.5–5.0)
Alkaline Phosphatase: 49 U/L (ref 38–126)
Anion gap: 9 (ref 5–15)
BUN: 26 mg/dL — ABNORMAL HIGH (ref 8–23)
CO2: 24 mmol/L (ref 22–32)
Calcium: 9.5 mg/dL (ref 8.9–10.3)
Chloride: 107 mmol/L (ref 98–111)
Creatinine, Ser: 1.57 mg/dL — ABNORMAL HIGH (ref 0.44–1.00)
GFR, Estimated: 35 mL/min — ABNORMAL LOW (ref >60)
Glucose, Bld: 109 mg/dL — ABNORMAL HIGH (ref 70–99)
Potassium: 4.3 mmol/L (ref 3.5–5.1)
Sodium: 140 mmol/L (ref 135–145)
Total Bilirubin: 1.2 mg/dL (ref 0.3–1.2)
Total Protein: 6.1 g/dL — ABNORMAL LOW (ref 6.5–8.1)

## 2022-01-22 LAB — CBC WITH DIFFERENTIAL/PLATELET
Abs Immature Granulocytes: 0.07 10*3/uL (ref 0.00–0.07)
Basophils Absolute: 0 10*3/uL (ref 0.0–0.1)
Basophils Relative: 0 %
Eosinophils Absolute: 0.2 10*3/uL (ref 0.0–0.5)
Eosinophils Relative: 2 %
HCT: 37.2 % (ref 36.0–46.0)
Hemoglobin: 12.3 g/dL (ref 12.0–15.0)
Immature Granulocytes: 1 %
Lymphocytes Relative: 19 %
Lymphs Abs: 1.5 10*3/uL (ref 0.7–4.0)
MCH: 33 pg (ref 26.0–34.0)
MCHC: 33.1 g/dL (ref 30.0–36.0)
MCV: 99.7 fL (ref 80.0–100.0)
Monocytes Absolute: 0.6 10*3/uL (ref 0.1–1.0)
Monocytes Relative: 7 %
Neutro Abs: 5.6 10*3/uL (ref 1.7–7.7)
Neutrophils Relative %: 71 %
Platelets: 297 10*3/uL (ref 150–400)
RBC: 3.73 MIL/uL — ABNORMAL LOW (ref 3.87–5.11)
RDW: 13 % (ref 11.5–15.5)
WBC: 8 10*3/uL (ref 4.0–10.5)
nRBC: 0 % (ref 0.0–0.2)

## 2022-01-22 MED ORDER — ACETAMINOPHEN 325 MG PO TABS: 650.0000 mg | ORAL_TABLET | ORAL | Status: DC

## 2022-01-22 MED ORDER — TOCILIZUMAB 400 MG/20ML IV SOLN
1000.0000 mg | INTRAVENOUS | Status: DC
  Administered 2022-01-22: 1000 mg via INTRAVENOUS
  Filled 2022-01-22: qty 10

## 2022-01-22 MED ORDER — DIPHENHYDRAMINE HCL 25 MG PO CAPS: 25.0000 mg | ORAL_CAPSULE | ORAL | Status: DC

## 2022-01-22 NOTE — Progress Notes (Addendum)
Next infusion scheduled for Actemra IV on 02/19/2022 and due for updated orders. Diagnosis: RA  Dose: '8mg'$ /kg every 4 weeks (984 mg based on last recorded weight of 122.9kg)  Last Clinic Visit: 02/07/2022 Next Clinic Visit: 04/17/2022  Last infusion: 01/22/2022  Labs: CBC and CMP on 01/22/22  - stable TB Gold: 05/08/2021 negative   Orders placed for Actemra IV x 3 doses along with premedication of acetaminophen and diphenhydramine to be administered 30 minutes before medication infusion.  Standing CBC with diff/platelet and CMP with GFR orders placed to be drawn every 2 months.  Next TB gold due 05/09/2022. Order for lipid panel also placed.  Knox Saliva, PharmD, MPH, BCPS, CPP Clinical Pharmacist (Rheumatology and Pulmonology)

## 2022-01-22 NOTE — Progress Notes (Signed)
Creatinine is elevated and stable.  CBC is normal.  Please forward results to her PCP.

## 2022-01-25 NOTE — Progress Notes (Deleted)
Office Visit Note  Patient: [name] [name]             Date of Birth: 11-07-1950           MRN: [id number]             PCP: Lowella Dandy, NP Referring: Lowella Dandy, NP Visit Date: 02/08/2022 Occupation: '@GUAROCC'$ @  Subjective:  No chief complaint on file.   History of Present Illness: [name] [name] is a 71 y.o. female ***   Activities of Daily Living:  Patient reports morning stiffness for *** {minute/hour:19697}.   Patient {ACTIONS;DENIES/REPORTS:21021675::"Denies"} nocturnal pain.  Difficulty dressing/grooming: {ACTIONS;DENIES/REPORTS:21021675::"Denies"} Difficulty climbing stairs: {ACTIONS;DENIES/REPORTS:21021675::"Denies"} Difficulty getting out of chair: {ACTIONS;DENIES/REPORTS:21021675::"Denies"} Difficulty using hands for taps, buttons, cutlery, and/or writing: {ACTIONS;DENIES/REPORTS:21021675::"Denies"}  No Rheumatology ROS completed.   PMFS History:  Patient Active Problem List   Diagnosis Date Noted   Dyspnea 11/05/2013   Rheumatoid arthritis ([place])    Hypertension    GERD (gastroesophageal reflux disease)    Morbid obesity ([place]) 03/10/2013   S/P right THA, AA 03/09/2013    Past Medical History:  Diagnosis Date   Deviated nasal septum    right side, cannot breathe out of right septum   Episcleritis periodica fugax of both eyes    GERD (gastroesophageal reflux disease)    Hypertension    Kidney disease    Pinguecula of left eye    Plantar fasciitis    Rheumatoid arthritis ([place])    oa and ra in arms, shane anderson   Spondylitis ([place])    Stasis dermatitis    UTI (lower urinary tract infection) 03/02/2013   treated with keflex for hip surgery on 03/09/13    Family History  Problem Relation Age of Onset   Heart disease Father    Kidney disease Father    Heart attack Father    Heart disease Brother    Cancer Brother        agent orange    Rheum arthritis Maternal Aunt    Hypertension Mother    Alzheimer's disease Mother    Heart attack Brother     Vascular Disease Brother    Kidney disease Brother    Past Surgical History:  Procedure Laterality Date   BIOPSY BREAST Left    [facility]   BREAST BIOPSY Right 11/16/2020   COLON BIOPSY  04/2020   COLONOSCOPY N/A 04/21/2015   DIAGNOSTIC MAMMOGRAM Bilateral 04/05/2016   ingrown toenail removed  5-6 yrs ago   toenail removal Left    TOTAL HIP ARTHROPLASTY Right 03/09/2013   Procedure: RIGHT TOTAL HIP ARTHROPLASTY ANTERIOR APPROACH;  Surgeon: Mauri Pole, MD;  Location: [facility];  Service: Orthopedics;  Laterality: Right;   Social History   Social History Narrative   Not on file   Immunization History  Administered Date(s) Administered   Influenza Split 12/26/2012   Influenza-Unspecified 11/29/2013, 11/07/2020   Moderna Sars-Covid-2 Vaccination 04/01/2019, 04/27/2019, 10/12/2019, 06/22/2020, 11/07/2020   Pneumococcal Polysaccharide-23 03/11/2013   Zoster Recombinat (Shingrix) 07/08/2017, 12/30/2017     Objective: Vital Signs: There were no vitals taken for this visit.   Physical Exam   Musculoskeletal Exam: ***  CDAI Exam: CDAI Score: -- Patient Global: --; Provider Global: -- Swollen: --; Tender: -- Joint Exam 02/08/2022   No joint exam has been documented for this visit   There is currently no information documented on the homunculus. Go to the Rheumatology activity and complete the homunculus joint exam.  Investigation: No additional findings.  Imaging: No  results found.  Recent Labs: Lab Results  Component Value Date   WBC 8.0 01/22/2022   HGB 12.3 01/22/2022   PLT 297 01/22/2022   NA 140 01/22/2022   K 4.3 01/22/2022   CL 107 01/22/2022   CO2 24 01/22/2022   GLUCOSE 109 (H) 01/22/2022   BUN 26 (H) 01/22/2022   CREATININE 1.57 (H) 01/22/2022   BILITOT 1.2 01/22/2022   ALKPHOS 49 01/22/2022   AST 29 01/22/2022   ALT 21 01/22/2022   PROT 6.1 (L) 01/22/2022   ALBUMIN 3.6 01/22/2022   CALCIUM 9.5 01/22/2022   GFRAA 48 (L) 10/28/2019    QFTBGOLDPLUS Negative 05/08/2021    Speciality Comments: HCQ-SOB, MTX-SOB, Arava-SOB, Humira-SOB, forgetfullness, Enbrel-Painful injection Actemra-03/2014  Procedures:  No procedures performed Allergies: Allopurinol, Asmanex (120 metered doses) [mometasone furoate], Guaifenesin & derivatives, Methotrexate derivatives, Methylprednisolone, Monosodium glutamate, Plaquenil [hydroxychloroquine sulfate], Prednisone, Prevnar 13 [pneumococcal 13-val conj vacc], Shingrix [zoster vac recomb adjuvanted], Tetanus toxoids, Baclofen, Ciprofloxacin, Epiceram, Finasteride, Humira [adalimumab], Keratin, Leflunomide, Other, Polysporin [bacitracin-polymyxin b], Rosuvastatin, Vitamin e, Neosporin [neomycin-bacitracin zn-polymyx], and Sulfa antibiotics   Assessment / Plan:     Visit Diagnoses: No diagnosis found.  Orders: No orders of the defined types were placed in this encounter.  No orders of the defined types were placed in this encounter.   Face-to-face time spent with patient was *** minutes. Greater than 50% of time was spent in counseling and coordination of care.  Follow-Up Instructions: No follow-ups on file.   Earnestine Mealing, CMA  Note - This record has been created using Editor, commissioning.  Chart creation errors have been sought, but may not always  have been located. Such creation errors do not reflect on  the standard of medical care.

## 2022-01-29 DIAGNOSIS — D510 Vitamin B12 deficiency anemia due to intrinsic factor deficiency: Secondary | ICD-10-CM | POA: Diagnosis not present

## 2022-02-07 ENCOUNTER — Encounter: Payer: Self-pay | Admitting: Rheumatology

## 2022-02-07 ENCOUNTER — Ambulatory Visit: Payer: Medicare Other

## 2022-02-07 ENCOUNTER — Ambulatory Visit: Payer: Medicare Other | Attending: Rheumatology | Admitting: Rheumatology

## 2022-02-07 ENCOUNTER — Ambulatory Visit (INDEPENDENT_AMBULATORY_CARE_PROVIDER_SITE_OTHER): Payer: Medicare Other

## 2022-02-07 VITALS — BP 112/69 | HR 64 | Ht 62.0 in | Wt 271.0 lb

## 2022-02-07 DIAGNOSIS — Z8639 Personal history of other endocrine, nutritional and metabolic disease: Secondary | ICD-10-CM

## 2022-02-07 DIAGNOSIS — M545 Low back pain, unspecified: Secondary | ICD-10-CM

## 2022-02-07 DIAGNOSIS — M546 Pain in thoracic spine: Secondary | ICD-10-CM | POA: Diagnosis not present

## 2022-02-07 DIAGNOSIS — I10 Essential (primary) hypertension: Secondary | ICD-10-CM

## 2022-02-07 DIAGNOSIS — G8929 Other chronic pain: Secondary | ICD-10-CM

## 2022-02-07 DIAGNOSIS — R7989 Other specified abnormal findings of blood chemistry: Secondary | ICD-10-CM

## 2022-02-07 DIAGNOSIS — Z8719 Personal history of other diseases of the digestive system: Secondary | ICD-10-CM | POA: Diagnosis not present

## 2022-02-07 DIAGNOSIS — Z79899 Other long term (current) drug therapy: Secondary | ICD-10-CM

## 2022-02-07 DIAGNOSIS — M79641 Pain in right hand: Secondary | ICD-10-CM | POA: Diagnosis not present

## 2022-02-07 DIAGNOSIS — M170 Bilateral primary osteoarthritis of knee: Secondary | ICD-10-CM

## 2022-02-07 DIAGNOSIS — M79642 Pain in left hand: Secondary | ICD-10-CM

## 2022-02-07 DIAGNOSIS — Z8261 Family history of arthritis: Secondary | ICD-10-CM

## 2022-02-07 DIAGNOSIS — Z96641 Presence of right artificial hip joint: Secondary | ICD-10-CM | POA: Diagnosis not present

## 2022-02-07 DIAGNOSIS — N1831 Chronic kidney disease, stage 3a: Secondary | ICD-10-CM | POA: Diagnosis not present

## 2022-02-07 DIAGNOSIS — R7611 Nonspecific reaction to tuberculin skin test without active tuberculosis: Secondary | ICD-10-CM | POA: Diagnosis not present

## 2022-02-07 DIAGNOSIS — Z862 Personal history of diseases of the blood and blood-forming organs and certain disorders involving the immune mechanism: Secondary | ICD-10-CM | POA: Diagnosis not present

## 2022-02-07 DIAGNOSIS — M0579 Rheumatoid arthritis with rheumatoid factor of multiple sites without organ or systems involvement: Secondary | ICD-10-CM

## 2022-02-07 DIAGNOSIS — Z87891 Personal history of nicotine dependence: Secondary | ICD-10-CM

## 2022-02-07 NOTE — Progress Notes (Signed)
Office Visit Note  Patient: [name] [name]             Date of Birth: 06-15-1950           MRN: [id number]             PCP: Lowella Dandy, NP Referring: Lowella Dandy, NP Visit Date: 02/07/2022 Occupation: _0 @  Subjective:  Medication management  History of Present Illness: [name] [name] is a 71 y.o. female with history of seropositive erosive rheumatoid arthritis.  She states she continues to have frequent flares of rheumatoid arthritis.  She has been taking prednisone taper as needed.  She states since she got new compression arthritis close she has had less frequent swelling.  She does not notice pain and swelling in any other joints.  She has been getting Actemra infusions on a regular basis.  She states she recently sprained her back and hamstrings so she has been using a wheelchair.  Activities of Daily Living:  Patient reports morning stiffness for 0 minutes.   Patient Reports nocturnal pain.  Difficulty dressing/grooming: Denies Difficulty climbing stairs: Reports Difficulty getting out of chair: Reports Difficulty using hands for taps, buttons, cutlery, and/or writing: Reports  Review of Systems  Constitutional:  Negative for fatigue.  HENT:  Negative for mouth sores and mouth dryness.   Eyes:  Positive for dryness.  Respiratory:  Negative for shortness of breath.   Cardiovascular:  Positive for swelling in legs/feet. Negative for chest pain and palpitations.  Gastrointestinal:  Negative for blood in stool, constipation and diarrhea.  Endocrine: Negative for increased urination.  Genitourinary:  Positive for involuntary urination.  Musculoskeletal:  Positive for joint pain, gait problem, joint pain, joint swelling, myalgias, morning stiffness and myalgias. Negative for muscle weakness and muscle tenderness.  Skin:  Positive for hair loss. Negative for color change, rash and sensitivity to sunlight.  Allergic/Immunologic: Negative for susceptible to infections.   Neurological:  Negative for dizziness and headaches.  Hematological:  Negative for swollen glands.  Psychiatric/Behavioral:  Negative for depressed mood and sleep disturbance. The patient is not nervous/anxious.     PMFS History:  Patient Active Problem List   Diagnosis Date Noted   Dyspnea 11/05/2013   Rheumatoid arthritis ([place])    Hypertension    GERD (gastroesophageal reflux disease)    Morbid obesity ([place]) 03/10/2013   S/P right THA, AA 03/09/2013    Past Medical History:  Diagnosis Date   Deviated nasal septum    right side, cannot breathe out of right septum   Episcleritis periodica fugax of both eyes    GERD (gastroesophageal reflux disease)    Hypertension    Kidney disease    Pinguecula of left eye    Plantar fasciitis    Rheumatoid arthritis ([place])    oa and [facility] in [facility], [facility]   Spondylitis ([place])    Stasis dermatitis    UTI (lower urinary tract infection) 03/02/2013   treated with keflex for hip surgery on 03/09/13    Family History  Problem Relation Age of Onset   Heart disease Father    Kidney disease Father    Heart attack Father    Heart disease Brother    Cancer Brother        agent orange    Rheum arthritis Maternal Aunt    Hypertension Mother    Alzheimer's disease Mother    Heart attack Brother    Vascular Disease Brother    Kidney disease Brother  Past Surgical History:  Procedure Laterality Date   BIOPSY BREAST Left    [facility]   BREAST BIOPSY Right 11/16/2020   COLON BIOPSY  04/2020   COLONOSCOPY N/A 04/21/2015   DIAGNOSTIC MAMMOGRAM Bilateral 04/05/2016   ingrown toenail removed  5-6 yrs ago   toenail removal Left    TOTAL HIP ARTHROPLASTY Right 03/09/2013   Procedure: RIGHT TOTAL HIP ARTHROPLASTY ANTERIOR APPROACH;  Surgeon: Mauri Pole, MD;  Location: [facility];  Service: Orthopedics;  Laterality: Right;   Social History   Social History Narrative   Not on file   Immunization History  Administered Date(s)  Administered   Influenza Split 12/26/2012   Influenza-Unspecified 11/29/2013, 11/07/2020, 11/13/2021   Moderna Covid-19 Vaccine Bivalent Booster 43yr & up 06/29/2021, 12/12/2021   Moderna Sars-Covid-2 Vaccination 04/01/2019, 04/27/2019, 10/12/2019, 06/22/2020, 11/07/2020   Pneumococcal Polysaccharide-23 03/11/2013   Rsv, Bivalent, Protein Subunit Rsvpref,pf (Evans Lance 11/13/2021   Zoster Recombinat (Shingrix) 07/08/2017, 12/30/2017     Objective: Vital Signs: BP 112/69 (BP Location: Left Wrist, Patient Position: Sitting, Cuff Size: Normal)   Pulse 64   Ht _0  (1.575 m)   Wt 271 lb (122.9 kg)   BMI 49.57 kg/m    Physical Exam Vitals and nursing note reviewed.  Constitutional:      Appearance: She is well-developed.  HENT:     Head: Normocephalic and atraumatic.  Eyes:     Conjunctiva/sclera: Conjunctivae normal.  Cardiovascular:     Rate and Rhythm: Normal rate and regular rhythm.     Heart sounds: Normal heart sounds.  Pulmonary:     Effort: Pulmonary effort is normal.     Breath sounds: Normal breath sounds.  Abdominal:     General: Bowel sounds are normal.     Palpations: Abdomen is soft.  Musculoskeletal:     Cervical back: Normal range of motion.  Lymphadenopathy:     Cervical: No cervical adenopathy.  Skin:    General: Skin is warm and dry.     Capillary Refill: Capillary refill takes less than 2 seconds.  Neurological:     Mental Status: She is alert and oriented to person, place, and time.  Psychiatric:        Behavior: Behavior normal.      Musculoskeletal Exam: Patient was in a wheelchair today.  Cervical spine was in good range of motion.  Thoracic kyphosis was noted.  She had no tenderness over thoracic region.  Shoulder joints and elbow joints were in good range of motion.  She had good range of motion of bilateral wrist joints.  No MCP synovitis was noted.  PIP and DIP thickening with limited range of motion of her PIP and DIP joints was noted.  I could  not assess hip joints and the sitting position.  Knee joints were in good range of motion.  There was no tenderness over ankles or MTPs.  Bilateral pedal edema was noted.  CDAI Exam: CDAI Score: -- Patient Global: 3 mm; Provider Global: 3 mm Swollen: --; Tender: -- Joint Exam 02/07/2022   No joint exam has been documented for this visit   There is currently no information documented on the homunculus. Go to the Rheumatology activity and complete the homunculus joint exam.  Investigation: No additional findings.  Imaging: No results found.  Recent Labs: Lab Results  Component Value Date   WBC 8.0 01/22/2022   HGB 12.3 01/22/2022   PLT 297 01/22/2022   NA 140 01/22/2022   K 4.3 01/22/2022  CL 107 01/22/2022   CO2 24 01/22/2022   GLUCOSE 109 (H) 01/22/2022   BUN 26 (H) 01/22/2022   CREATININE 1.57 (H) 01/22/2022   BILITOT 1.2 01/22/2022   ALKPHOS 49 01/22/2022   AST 29 01/22/2022   ALT 21 01/22/2022   PROT 6.1 (L) 01/22/2022   ALBUMIN 3.6 01/22/2022   CALCIUM 9.5 01/22/2022   GFRAA 48 (L) 10/28/2019   QFTBGOLDPLUS Negative 05/08/2021    Speciality Comments: HCQ-SOB, MTX-SOB, Arava-SOB, Humira-SOB, forgetfullness, Enbrel-Painful injection Actemra-03/2014  Procedures:  No procedures performed Allergies: Allopurinol, Asmanex (120 metered doses) [mometasone furoate], Guaifenesin & derivatives, Methotrexate derivatives, Methylprednisolone, Monosodium glutamate, Plaquenil [hydroxychloroquine sulfate], Prednisone, Prevnar 13 [pneumococcal 13-val conj vacc], Shingrix [zoster vac recomb adjuvanted], Tetanus toxoids, Baclofen, Ciprofloxacin, Epiceram, Finasteride, Humira [adalimumab], Keratin, Leflunomide, Other, Polysporin [bacitracin-polymyxin b], Rosuvastatin, Vitamin e, Neosporin [neomycin-bacitracin zn-polymyx], and Sulfa antibiotics   Assessment / Plan:     Visit Diagnoses: Rheumatoid arthritis with rheumatoid factor of multiple sites without organ or systems involvement  (HCC) - Dx 20 years ago, Followed by Dr. Dossie Der. 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 ESR 18, CRP<1: Patient reports having frequent flares of rheumatoid arthritis despite being on Actemra right milligrams IV infusions every 4 weeks.  Last prednisone taper was in October 2023.  Patient states that since she bought new compression glove she is not having as many flares.  I had a detailed discussion with the patient.  Since she had side effects from hydroxychloroquine, methotrexate and Arava she does not have very many joints this.  She had intolerance to anti-TNF's.  I discussed the option of either adding low-dose prednisone to Actemra or switching her to JAK inhibitors.  Side effects of JAK inhibitors were discussed.  Patient does not want to change the medication at this point.  She also does not want to add low-dose prednisone to her regimen.  High risk medication use - Actemra 8 mg/kg IV infusions q4wk (started in 2016).  Previous treatment include:HCQ-SOB, MTX-SOB, Arava-SOB, Humira-SOB, forgetfullness, Enbrel-Painful inj.  Labs obtained on January 22, 2022 CBC was normal.  CMP showed creatinine 1.57 which has been stable.  She has been followed by nephrologist.  She gets labs with her infusions.  Information on immunization was placed in the wrist.  She was advised to hold Actemra if she develops an infection.  Elevated serum creatinine - Followed by Dr. Hollie Salk at [facility].  Positive TB test - TB Gold negative on 05/08/2021 and will continue to be monitored yearly.  Pain in both hands -she complains of pain and discomfort in her bilateral hands.  She has intermittent pain and swelling.  X-rays were obtained today for comparison.  She has radiographic progression with increased joint space narrowing and erosive changes.  X-ray findings were discussed with the patient.  Patient did not want to modify treatment at this point.  Plan: XR Hand 2 View Right, XR Hand 2 View Left  Status post total hip replacement,  right-difficult to assess as she was in the wheelchair.  Primary osteoarthritis of both knees-she had good range of motion of the bilateral knee joints.  Pain in thoracic spine-she continues to have some thoracic discomfort.  Chronic bilateral low back pain without sciatica -she has chronic lower back pain.  X-rays were consistent with multilevel spondylosis and facet joint arthropathy.  History of gastroesophageal reflux (GERD)  History of hyperlipidemia  Essential hypertension-blood pressure was 112/69 today.  Stage 3a chronic kidney disease ([place]) - She is followed by nephrologist.  History of anemia-most likely  due to kidney disease.  Family history of rheumatoid arthritis  Former smoker    Orders: Orders Placed This Encounter  Procedures   XR Hand 2 View Right   XR Hand 2 View Left   No orders of the defined types were placed in this encounter.    Follow-Up Instructions: Return in about 5 months (around 07/09/2022) for Rheumatoid arthritis.   Bo Merino, MD  Note - This record has been created using Editor, commissioning.  Chart creation errors have been sought, but may not always  have been located. Such creation errors do not reflect on  the standard of medical care.

## 2022-02-07 NOTE — Patient Instructions (Signed)

## 2022-02-08 ENCOUNTER — Ambulatory Visit: Payer: Medicare Other | Admitting: Rheumatology

## 2022-02-08 DIAGNOSIS — M546 Pain in thoracic spine: Secondary | ICD-10-CM

## 2022-02-08 DIAGNOSIS — R7611 Nonspecific reaction to tuberculin skin test without active tuberculosis: Secondary | ICD-10-CM

## 2022-02-08 DIAGNOSIS — M0579 Rheumatoid arthritis with rheumatoid factor of multiple sites without organ or systems involvement: Secondary | ICD-10-CM

## 2022-02-08 DIAGNOSIS — R7989 Other specified abnormal findings of blood chemistry: Secondary | ICD-10-CM

## 2022-02-08 DIAGNOSIS — Z87891 Personal history of nicotine dependence: Secondary | ICD-10-CM

## 2022-02-08 DIAGNOSIS — Z79899 Other long term (current) drug therapy: Secondary | ICD-10-CM

## 2022-02-08 DIAGNOSIS — Z96641 Presence of right artificial hip joint: Secondary | ICD-10-CM

## 2022-02-08 DIAGNOSIS — G8929 Other chronic pain: Secondary | ICD-10-CM

## 2022-02-08 DIAGNOSIS — Z8639 Personal history of other endocrine, nutritional and metabolic disease: Secondary | ICD-10-CM

## 2022-02-08 DIAGNOSIS — Z8719 Personal history of other diseases of the digestive system: Secondary | ICD-10-CM

## 2022-02-08 DIAGNOSIS — N1831 Chronic kidney disease, stage 3a: Secondary | ICD-10-CM

## 2022-02-08 DIAGNOSIS — I10 Essential (primary) hypertension: Secondary | ICD-10-CM

## 2022-02-08 DIAGNOSIS — Z8261 Family history of arthritis: Secondary | ICD-10-CM

## 2022-02-08 DIAGNOSIS — M170 Bilateral primary osteoarthritis of knee: Secondary | ICD-10-CM

## 2022-02-08 DIAGNOSIS — Z862 Personal history of diseases of the blood and blood-forming organs and certain disorders involving the immune mechanism: Secondary | ICD-10-CM

## 2022-02-12 ENCOUNTER — Telehealth: Payer: Self-pay | Admitting: Pharmacist

## 2022-02-12 NOTE — Telephone Encounter (Signed)
Patient due for next ACTEMRA infusion on 02/19/22.  She confirmed that she will have Medicare A/B + BCBS supplement in 2024.  Medicare covers 80% of the infusion and no authorization is required, and the Plan G supplement would cover the 20% of the cost that was not paid for by Medicare as long as Medicare covered the medication after patient pays her Medicare B deductible. Supplement plan follows Medicare guidelines. The Plan G supplement covers Part A deductible and coinsurance as well as Part B coinsurance after deductible is met. Active as of 12/27/2014. No annual out-of-pocket max.  Phone: [phone] Call reference # Bernadene Person 02/12/2022  Knox Saliva, PharmD, MPH, BCPS, CPP Clinical Pharmacist (Rheumatology and Pulmonology)

## 2022-02-15 NOTE — Addendum Note (Signed)
Addended by: Cassandria Anger on: 02/15/2022 08:37 AM   Modules accepted: Orders

## 2022-02-19 ENCOUNTER — Inpatient Hospital Stay (HOSPITAL_COMMUNITY): Admission: RE | Admit: 2022-02-19 | Payer: Medicare Other | Source: Ambulatory Visit

## 2022-02-21 DIAGNOSIS — L609 Nail disorder, unspecified: Secondary | ICD-10-CM | POA: Diagnosis not present

## 2022-02-21 DIAGNOSIS — I890 Lymphedema, not elsewhere classified: Secondary | ICD-10-CM | POA: Diagnosis not present

## 2022-02-26 ENCOUNTER — Other Ambulatory Visit: Payer: Self-pay | Admitting: *Deleted

## 2022-02-26 MED ORDER — PREDNISONE 5 MG PO TABS: 5.0000 mg | ORAL_TABLET | Freq: Every day | ORAL | 1 refills | Status: DC

## 2022-02-26 NOTE — Telephone Encounter (Signed)
Patient contacted the office stating she is having another flare up. Patient states she is tired on not being able to function. Patient states she is now agreeable to the low dose prednisone. She request prescription be sent to [facility].

## 2022-02-26 NOTE — Addendum Note (Signed)
Addended by: Carole Binning on: 02/26/2022 01:41 PM   Modules accepted: Orders

## 2022-02-26 NOTE — Telephone Encounter (Signed)
Prednisone 5 mg p.o. daily total 30-day supply with 1 refill.

## 2022-03-04 ENCOUNTER — Ambulatory Visit (HOSPITAL_COMMUNITY)
Admission: RE | Admit: 2022-03-04 | Discharge: 2022-03-04 | Disposition: A | Discharge status: Home / Self Care | Payer: Medicare Other | Source: Ambulatory Visit | Attending: Rheumatology | Admitting: Rheumatology

## 2022-03-04 DIAGNOSIS — Z79899 Other long term (current) drug therapy: Secondary | ICD-10-CM | POA: Diagnosis not present

## 2022-03-04 DIAGNOSIS — M0579 Rheumatoid arthritis with rheumatoid factor of multiple sites without organ or systems involvement: Secondary | ICD-10-CM

## 2022-03-04 LAB — LIPID PANEL
Cholesterol: 164 mg/dL (ref 0–200)
HDL: 44 mg/dL (ref >40)
LDL Cholesterol: 85 mg/dL (ref 0–99)
Total CHOL/HDL Ratio: 3.7 RATIO
Triglycerides: 175 mg/dL — ABNORMAL HIGH (ref <150)
VLDL: 35 mg/dL (ref 0–40)

## 2022-03-04 LAB — CBC WITH DIFFERENTIAL/PLATELET
Abs Immature Granulocytes: 0.04 10*3/uL (ref 0.00–0.07)
Basophils Absolute: 0 10*3/uL (ref 0.0–0.1)
Basophils Relative: 0 %
Eosinophils Absolute: 0.2 10*3/uL (ref 0.0–0.5)
Eosinophils Relative: 2 %
HCT: 37.8 % (ref 36.0–46.0)
Hemoglobin: 12.8 g/dL (ref 12.0–15.0)
Immature Granulocytes: 0 %
Lymphocytes Relative: 14 %
Lymphs Abs: 1.2 10*3/uL (ref 0.7–4.0)
MCH: 33.3 pg (ref 26.0–34.0)
MCHC: 33.9 g/dL (ref 30.0–36.0)
MCV: 98.4 fL (ref 80.0–100.0)
Monocytes Absolute: 0.5 10*3/uL (ref 0.1–1.0)
Monocytes Relative: 6 %
Neutro Abs: 7 10*3/uL (ref 1.7–7.7)
Neutrophils Relative %: 78 %
Platelets: 325 10*3/uL (ref 150–400)
RBC: 3.84 MIL/uL — ABNORMAL LOW (ref 3.87–5.11)
RDW: 12.6 % (ref 11.5–15.5)
WBC: 9 10*3/uL (ref 4.0–10.5)
nRBC: 0 % (ref 0.0–0.2)

## 2022-03-04 LAB — COMPREHENSIVE METABOLIC PANEL
ALT: 22 U/L (ref 0–44)
AST: 24 U/L (ref 15–41)
Albumin: 3.6 g/dL (ref 3.5–5.0)
Alkaline Phosphatase: 52 U/L (ref 38–126)
Anion gap: 9 (ref 5–15)
BUN: 28 mg/dL — ABNORMAL HIGH (ref 8–23)
CO2: 24 mmol/L (ref 22–32)
Calcium: 9.2 mg/dL (ref 8.9–10.3)
Chloride: 104 mmol/L (ref 98–111)
Creatinine, Ser: 1.51 mg/dL — ABNORMAL HIGH (ref 0.44–1.00)
GFR, Estimated: 37 mL/min — ABNORMAL LOW (ref >60)
Glucose, Bld: 113 mg/dL — ABNORMAL HIGH (ref 70–99)
Potassium: 4.1 mmol/L (ref 3.5–5.1)
Sodium: 137 mmol/L (ref 135–145)
Total Bilirubin: 1.5 mg/dL — ABNORMAL HIGH (ref 0.3–1.2)
Total Protein: 6.3 g/dL — ABNORMAL LOW (ref 6.5–8.1)

## 2022-03-04 MED ORDER — TOCILIZUMAB 400 MG/20ML IV SOLN
8.0000 mg/kg | INTRAVENOUS | Status: DC
  Administered 2022-03-04: 988 mg via INTRAVENOUS
  Filled 2022-03-04: qty 10

## 2022-03-04 MED ORDER — ACETAMINOPHEN 325 MG PO TABS: 650.0000 mg | ORAL_TABLET | ORAL | Status: DC

## 2022-03-04 MED ORDER — DIPHENHYDRAMINE HCL 25 MG PO CAPS: 25.0000 mg | ORAL_CAPSULE | ORAL | Status: DC

## 2022-03-04 NOTE — Progress Notes (Signed)
Triglycerides are elevated-175. Rest of lipid panel WNL.  Creatinine remains elevated but stable. GFR is low but stable-37.   Glucose is 113.   Total protein is borderline low but is trending up.

## 2022-03-04 NOTE — Progress Notes (Signed)
RBC count is borderline low but has improved. Rest of CBC WNL.

## 2022-03-05 DIAGNOSIS — D510 Vitamin B12 deficiency anemia due to intrinsic factor deficiency: Secondary | ICD-10-CM | POA: Diagnosis not present

## 2022-03-07 DIAGNOSIS — Z713 Dietary counseling and surveillance: Secondary | ICD-10-CM | POA: Diagnosis not present

## 2022-03-07 DIAGNOSIS — N1831 Chronic kidney disease, stage 3a: Secondary | ICD-10-CM | POA: Diagnosis not present

## 2022-03-19 ENCOUNTER — Encounter (HOSPITAL_COMMUNITY): Payer: Medicare Other

## 2022-03-26 DIAGNOSIS — E559 Vitamin D deficiency, unspecified: Secondary | ICD-10-CM | POA: Diagnosis not present

## 2022-03-26 DIAGNOSIS — Z792 Long term (current) use of antibiotics: Secondary | ICD-10-CM | POA: Diagnosis not present

## 2022-03-26 DIAGNOSIS — M069 Rheumatoid arthritis, unspecified: Secondary | ICD-10-CM | POA: Diagnosis not present

## 2022-03-26 DIAGNOSIS — I10 Essential (primary) hypertension: Secondary | ICD-10-CM | POA: Diagnosis not present

## 2022-03-26 DIAGNOSIS — R739 Hyperglycemia, unspecified: Secondary | ICD-10-CM | POA: Diagnosis not present

## 2022-03-26 DIAGNOSIS — R600 Localized edema: Secondary | ICD-10-CM | POA: Diagnosis not present

## 2022-03-26 DIAGNOSIS — N1831 Chronic kidney disease, stage 3a: Secondary | ICD-10-CM | POA: Diagnosis not present

## 2022-03-26 DIAGNOSIS — D510 Vitamin B12 deficiency anemia due to intrinsic factor deficiency: Secondary | ICD-10-CM | POA: Diagnosis not present

## 2022-03-26 DIAGNOSIS — I872 Venous insufficiency (chronic) (peripheral): Secondary | ICD-10-CM | POA: Diagnosis not present

## 2022-03-26 DIAGNOSIS — E785 Hyperlipidemia, unspecified: Secondary | ICD-10-CM | POA: Diagnosis not present

## 2022-04-01 ENCOUNTER — Encounter (HOSPITAL_COMMUNITY): Payer: Medicare Other

## 2022-04-02 ENCOUNTER — Ambulatory Visit (HOSPITAL_COMMUNITY)
Admission: RE | Admit: 2022-04-02 | Discharge: 2022-04-02 | Disposition: A | Discharge status: Home / Self Care | Payer: Medicare Other | Source: Ambulatory Visit | Attending: Rheumatology | Admitting: Rheumatology

## 2022-04-02 DIAGNOSIS — M0579 Rheumatoid arthritis with rheumatoid factor of multiple sites without organ or systems involvement: Secondary | ICD-10-CM | POA: Diagnosis not present

## 2022-04-02 MED ORDER — ACETAMINOPHEN 325 MG PO TABS: 650.0000 mg | ORAL_TABLET | ORAL | Status: DC

## 2022-04-02 MED ORDER — DIPHENHYDRAMINE HCL 25 MG PO CAPS: 25.0000 mg | ORAL_CAPSULE | ORAL | Status: DC

## 2022-04-02 MED ORDER — TOCILIZUMAB 400 MG/20ML IV SOLN
1000.0000 mg | INTRAVENOUS | Status: DC
  Administered 2022-04-02: 1000 mg via INTRAVENOUS
  Filled 2022-04-02: qty 40

## 2022-04-03 NOTE — Progress Notes (Signed)
Office Visit Note  Patient: [name] [name]             Date of Birth: September 14, 1950           MRN: [id number]             PCP: Lowella Dandy, NP Referring: Lowella Dandy, NP Visit Date: 04/17/2022 Occupation: @GUAROCC$ @  Subjective:  Pain in both hands  History of Present Illness: [name] [name] is a 72 y.o. female with history of seropositive rheumatoid arthritis.  She states she has done better on the combination of IV Actemra and prednisone 5 mg p.o. daily.  She had several tapers of prednisone in the past due to flares.  She was placed on prednisone 5 mg p.o. daily on February 28, 2022 she has had very mild flares not requiring any major prednisone tapers.  Last Actemra infusion was on April 02, 2022.  She continues to have some discomfort in her hands but she has not noticed any joint swelling.  Ambulates with the help of a cane or a walker.    Activities of Daily Living:  Patient reports morning stiffness for 5 minutes.   Patient Reports nocturnal pain.  Difficulty dressing/grooming: Denies Difficulty climbing stairs: Reports Difficulty getting out of chair: Reports Difficulty using hands for taps, buttons, cutlery, and/or writing: Denies  Review of Systems  Constitutional:  Positive for fatigue.  HENT:  Positive for mouth dryness. Negative for mouth sores.   Eyes:  Positive for dryness.  Respiratory:  Negative for difficulty breathing.   Cardiovascular: Negative.  Negative for chest pain and palpitations.  Gastrointestinal: Negative.  Negative for blood in stool, constipation and diarrhea.  Endocrine: Negative.  Negative for increased urination.  Genitourinary: Negative.  Negative for involuntary urination.  Musculoskeletal:  Positive for joint pain, gait problem, joint pain, joint swelling and morning stiffness. Negative for myalgias, muscle weakness, muscle tenderness and myalgias.  Skin:  Negative for color change, rash, hair loss and sensitivity to sunlight.   Allergic/Immunologic: Negative.  Negative for susceptible to infections.  Neurological:  Negative for dizziness and headaches.  Hematological: Negative.  Negative for swollen glands.  Psychiatric/Behavioral: Negative.  Negative for depressed mood and sleep disturbance. The patient is not nervous/anxious.     PMFS History:  Patient Active Problem List   Diagnosis Date Noted   Primary osteoarthritis of both knees 04/17/2022   History of hyperlipidemia 04/17/2022   Stage 3a chronic kidney disease ([place]) 04/17/2022   Dyspnea 11/05/2013   Rheumatoid arthritis ([place])    Hypertension    GERD (gastroesophageal reflux disease)    Morbid obesity ([place]) 03/10/2013   S/P right THA, AA 03/09/2013    Past Medical History:  Diagnosis Date   Deviated nasal septum    right side, cannot breathe out of right septum   Episcleritis periodica fugax of both eyes    GERD (gastroesophageal reflux disease)    Hypertension    Kidney disease    Pinguecula of left eye    Plantar fasciitis    Rheumatoid arthritis ([place])    oa and ra in [facility], shane anderson   Spondylitis ([place])    Stasis dermatitis    UTI (lower urinary tract infection) 03/02/2013   treated with keflex for hip surgery on 03/09/13    Family History  Problem Relation Age of Onset   Heart disease Father    Kidney disease Father    Heart attack Father    Heart disease Brother    Cancer  Brother        agent orange    Rheum arthritis Maternal Aunt    Hypertension Mother    Alzheimer's disease Mother    Heart attack Brother    Vascular Disease Brother    Kidney disease Brother    Past Surgical History:  Procedure Laterality Date   BIOPSY BREAST Left    [facility]   BREAST BIOPSY Right 11/16/2020   COLON BIOPSY  04/2020   COLONOSCOPY N/A 04/21/2015   DIAGNOSTIC MAMMOGRAM Bilateral 04/05/2016   ingrown toenail removed  5-6 yrs ago   toenail removal Left    TOTAL HIP ARTHROPLASTY Right 03/09/2013   Procedure: RIGHT TOTAL HIP  ARTHROPLASTY ANTERIOR APPROACH;  Surgeon: Mauri Pole, MD;  Location: [facility];  Service: Orthopedics;  Laterality: Right;   Social History   Social History Narrative   Not on file   Immunization History  Administered Date(s) Administered   Influenza Split 12/26/2012   Influenza-Unspecified 11/29/2013, 11/07/2020, 11/13/2021   Moderna Covid-19 Vaccine Bivalent Booster 64yr & up 06/29/2021, 12/12/2021   Moderna Sars-Covid-2 Vaccination 04/01/2019, 04/27/2019, 10/12/2019, 06/22/2020, 11/07/2020   Pneumococcal Polysaccharide-23 03/11/2013   Rsv, Bivalent, Protein Subunit Rsvpref,pf (Evans Lance 11/13/2021   Zoster Recombinat (Shingrix) 07/08/2017, 12/30/2017     Objective: Vital Signs: BP 117/75 (BP Location: Left Arm, Patient Position: Sitting, Cuff Size: Large)   Pulse 73   Resp 14   Ht 5' 2"$  (1.575 m)   Wt 252 lb 9.6 oz (114.6 kg)   BMI 46.20 kg/m    Physical Exam Vitals and nursing note reviewed.  Constitutional:      Appearance: She is well-developed.  HENT:     Head: Normocephalic and atraumatic.  Eyes:     Conjunctiva/sclera: Conjunctivae normal.  Cardiovascular:     Rate and Rhythm: Normal rate and regular rhythm.     Heart sounds: Normal heart sounds.  Pulmonary:     Effort: Pulmonary effort is normal.     Breath sounds: Normal breath sounds.  Abdominal:     General: Bowel sounds are normal.     Palpations: Abdomen is soft.  Musculoskeletal:     Cervical back: Normal range of motion.     Right lower leg: Edema present.     Left lower leg: Edema present.  Lymphadenopathy:     Cervical: No cervical adenopathy.  Skin:    General: Skin is warm and dry.     Capillary Refill: Capillary refill takes less than 2 seconds.  Neurological:     Mental Status: She is alert and oriented to person, place, and time.  Psychiatric:        Behavior: Behavior normal.      Musculoskeletal Exam: Patient is ambulating with the help of a cane today.  Cervical spine was in good  range of motion.  Thoracic kyphosis was noted.  Lumbar spine was difficult to assess in the sitting position.  Shoulder joint abduction was 120 degrees without discomfort.  She has contracture in her left elbow joint without any synovitis.  Right elbow joint was in good range of motion.  There was no synovitis of her wrist joints.  There was no synovitis over MCP joints.  MCP thickening was noted.  PIP and DIP thickening was noted.  Rheumatoid nodules were noted over right third PIP joint.  Right first DIP appears to have chalky appearance consistent with possible tophus.  Hip joints were difficult to assess in the sitting position.  Knee joints were in good  range of motion.  There was no tenderness over ankles or MTPs.  She had bilateral pedal edema.  CDAI Exam: CDAI Score: -- Patient Global: 6 mm; Provider Global: -- Swollen: --; Tender: -- Joint Exam 04/17/2022   No joint exam has been documented for this visit   There is currently no information documented on the homunculus. Go to the Rheumatology activity and complete the homunculus joint exam.  Investigation: No additional findings.  Imaging: No results found.  Recent Labs: Lab Results  Component Value Date   WBC 9.0 03/04/2022   HGB 12.8 03/04/2022   PLT 325 03/04/2022   NA 137 03/04/2022   K 4.1 03/04/2022   CL 104 03/04/2022   CO2 24 03/04/2022   GLUCOSE 113 (H) 03/04/2022   BUN 28 (H) 03/04/2022   CREATININE 1.51 (H) 03/04/2022   BILITOT 1.5 (H) 03/04/2022   ALKPHOS 52 03/04/2022   AST 24 03/04/2022   ALT 22 03/04/2022   PROT 6.3 (L) 03/04/2022   ALBUMIN 3.6 03/04/2022   CALCIUM 9.2 03/04/2022   GFRAA 48 (L) 10/28/2019   QFTBGOLDPLUS Negative 05/08/2021    Speciality Comments: HCQ-SOB, MTX-SOB, Arava-SOB, Humira-SOB, forgetfullness, Enbrel-Painful injection Actemra-03/2014  Procedures:  No procedures performed Allergies: Allopurinol, Asmanex (120 metered doses) [mometasone furoate], Guaifenesin & derivatives,  Methotrexate derivatives, Methylprednisolone, Monosodium glutamate, Plaquenil [hydroxychloroquine sulfate], Prednisone, Prevnar 13 [pneumococcal 13-val conj vacc], Shingrix [zoster vac recomb adjuvanted], Tetanus toxoids, Baclofen, Ciprofloxacin, Epiceram, Finasteride, Humira [adalimumab], Keratin, Leflunomide, Other, Polysporin [bacitracin-polymyxin b], Rosuvastatin, Vitamin e, Neosporin [neomycin-bacitracin zn-polymyx], and Sulfa antibiotics   Assessment / Plan:     Visit Diagnoses: Rheumatoid arthritis with rheumatoid factor of multiple sites without organ or systems involvement (HCC) - Dx 20 years ago, Followed by Dr. Dossie Der. 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 ESR 18, CRP<1: She continues to have pain and discomfort in her bilateral hands.  She has noticed improvement in her symptoms since she has been on prednisone 5 mg p.o. daily along with Actemra infusions.  Prednisone 5 mg p.o. daily was added on February 28, 2021 due to frequent prednisone taper per request.  No synovitis was noted on the examination.  She has synovial thickening over bilateral MCP joints.  Rheumatoid nodules noted over right third PIP joint.  Possible tophus was noted over first DIP joint.  She had intolerance to multiple medications.  Patient wants to continue prednisone.  Side effects of long-term prednisone use were discussed at length.  Prescription refill for prednisone 5 mg p.o. daily for next 3 months was given.  I will check uric acid with her next labs.  High risk medication use - Actemra 8 mg/kg IV infusions q4wk (started in 2016).  Previous treatment include:HCQ-SOB, MTX-SOB, Arava-SOB, Humira-SOB, forgetfullness, Enbrel-Painful inj. -TB Gold was negative on May 08, 2021.  Plan: QuantiFERON-TB Gold Plus with her next Actemra infusion.  Information on immunization was placed in the AVS.  She was advised to hold Actemra if she develops an infection and resume after the infection resolves.  Current chronic use of systemic steroids-she  is on prednisone 5 mg p.o. daily.  She wants to continue on prednisone.  Prescription refill was sent.  Side effects of prednisone including weight gain, hypertension, diabetes, cataracts, osteoporosis, increased risk of heart disease were discussed.  Patient states she has lost weight since she has been on prednisone as she has been more active.  Elevated serum creatinine -creatinine was elevated at 1.51 on March 04, 2022.  Followed by [facility] Hollie Salk at [facility].  Positive TB  test - TB Gold negative on 05/08/2021 and will continue to be monitored yearly.  She will get TB Gold with her next labs.  Pain in both hands - X-rays were obtained on 02/07/2022 for comparison.  She has radiographic progression with increased joint space narrowing and erosive changes. -Questionable tophus was noted over right second finger DIP joint.  I will check uric acid with her next labs.  Patient did not want to have labs drawn in the office today.  Plan: Uric acid  Status post total hip replacement, right-she denies any discomfort.  Hip joint range of motion could not be assessed in the sitting position.  Primary osteoarthritis of both knees-she had good range of motion of bilateral knee joints without any warmth swelling or effusion.  Pain in thoracic spine-she has thoracic kyphosis and chronic pain.  Chronic bilateral low back pain without sciatica -chronic pain.  X-rays were consistent with multilevel spondylosis and facet joint arthropathy.  Essential hypertension-blood pressure was 117/75 today.  History of hyperlipidemia-she is on Zetia.  Stage 3a chronic kidney disease ([place]) - She is followed by nephrologist.  Family history of rheumatoid arthritis  Former smoker  Osteoporosis screening - Patient reports having a DEXA scan in 2023 by her PCP.  She will have DEXA report forwarded to [place].  Patient is on long-term low-dose prednisone now, an  increased risk of osteoporosis and fracture was discussed with  the patient.  Orders: Orders Placed This Encounter  Procedures   Uric acid   QuantiFERON-TB Gold Plus   Meds ordered this encounter  Medications   predniSONE (DELTASONE) 5 MG tablet    Sig: Take 1 tablet (5 mg total) by mouth daily with breakfast.    Dispense:  30 tablet    Refill:  2     Follow-Up Instructions: Return in about 3 months (around 07/16/2022) for Rheumatoid arthritis, Osteoarthritis.   Bo Merino, MD  Note - This record has been created using Editor, commissioning.  Chart creation errors have been sought, but may not always  have been located. Such creation errors do not reflect on  the standard of medical care.

## 2022-04-04 DIAGNOSIS — D510 Vitamin B12 deficiency anemia due to intrinsic factor deficiency: Secondary | ICD-10-CM | POA: Diagnosis not present

## 2022-04-04 DIAGNOSIS — N1831 Chronic kidney disease, stage 3a: Secondary | ICD-10-CM | POA: Diagnosis not present

## 2022-04-04 DIAGNOSIS — Z713 Dietary counseling and surveillance: Secondary | ICD-10-CM | POA: Diagnosis not present

## 2022-04-09 ENCOUNTER — Other Ambulatory Visit: Payer: Self-pay | Admitting: Physician Assistant

## 2022-04-17 ENCOUNTER — Telehealth: Payer: Self-pay | Admitting: Pharmacist

## 2022-04-17 ENCOUNTER — Ambulatory Visit: Payer: Medicare Other | Attending: Rheumatology | Admitting: Rheumatology

## 2022-04-17 ENCOUNTER — Encounter: Payer: Self-pay | Admitting: Rheumatology

## 2022-04-17 ENCOUNTER — Other Ambulatory Visit: Payer: Self-pay | Admitting: Pharmacist

## 2022-04-17 VITALS — BP 117/75 | HR 73 | Resp 14 | Ht 62.0 in | Wt 252.6 lb

## 2022-04-17 DIAGNOSIS — M545 Low back pain, unspecified: Secondary | ICD-10-CM | POA: Diagnosis not present

## 2022-04-17 DIAGNOSIS — M170 Bilateral primary osteoarthritis of knee: Secondary | ICD-10-CM | POA: Insufficient documentation

## 2022-04-17 DIAGNOSIS — Z87891 Personal history of nicotine dependence: Secondary | ICD-10-CM | POA: Insufficient documentation

## 2022-04-17 DIAGNOSIS — Z8261 Family history of arthritis: Secondary | ICD-10-CM | POA: Insufficient documentation

## 2022-04-17 DIAGNOSIS — M79641 Pain in right hand: Secondary | ICD-10-CM | POA: Diagnosis not present

## 2022-04-17 DIAGNOSIS — Z96641 Presence of right artificial hip joint: Secondary | ICD-10-CM | POA: Insufficient documentation

## 2022-04-17 DIAGNOSIS — N1831 Chronic kidney disease, stage 3a: Secondary | ICD-10-CM | POA: Insufficient documentation

## 2022-04-17 DIAGNOSIS — I10 Essential (primary) hypertension: Secondary | ICD-10-CM

## 2022-04-17 DIAGNOSIS — G8929 Other chronic pain: Secondary | ICD-10-CM | POA: Diagnosis not present

## 2022-04-17 DIAGNOSIS — Z7952 Long term (current) use of systemic steroids: Secondary | ICD-10-CM | POA: Diagnosis not present

## 2022-04-17 DIAGNOSIS — M0579 Rheumatoid arthritis with rheumatoid factor of multiple sites without organ or systems involvement: Secondary | ICD-10-CM | POA: Insufficient documentation

## 2022-04-17 DIAGNOSIS — M546 Pain in thoracic spine: Secondary | ICD-10-CM | POA: Insufficient documentation

## 2022-04-17 DIAGNOSIS — Z79899 Other long term (current) drug therapy: Secondary | ICD-10-CM | POA: Insufficient documentation

## 2022-04-17 DIAGNOSIS — R7611 Nonspecific reaction to tuberculin skin test without active tuberculosis: Secondary | ICD-10-CM | POA: Diagnosis not present

## 2022-04-17 DIAGNOSIS — Z111 Encounter for screening for respiratory tuberculosis: Secondary | ICD-10-CM

## 2022-04-17 DIAGNOSIS — Z8639 Personal history of other endocrine, nutritional and metabolic disease: Secondary | ICD-10-CM | POA: Insufficient documentation

## 2022-04-17 DIAGNOSIS — M79642 Pain in left hand: Secondary | ICD-10-CM | POA: Diagnosis not present

## 2022-04-17 DIAGNOSIS — Z13820 Encounter for screening for osteoporosis: Secondary | ICD-10-CM | POA: Diagnosis not present

## 2022-04-17 DIAGNOSIS — R7989 Other specified abnormal findings of blood chemistry: Secondary | ICD-10-CM | POA: Insufficient documentation

## 2022-04-17 DIAGNOSIS — Z862 Personal history of diseases of the blood and blood-forming organs and certain disorders involving the immune mechanism: Secondary | ICD-10-CM

## 2022-04-17 DIAGNOSIS — Z8719 Personal history of other diseases of the digestive system: Secondary | ICD-10-CM

## 2022-04-17 MED ORDER — PREDNISONE 5 MG PO TABS: 5.0000 mg | ORAL_TABLET | Freq: Every day | ORAL | 2 refills | Status: DC

## 2022-04-17 NOTE — Progress Notes (Signed)
Order placed for uric and TB gold to be drawn with upcoming infusion on 04/30/2022  Knox Saliva, PharmD, MPH, BCPS, CPP Clinical Pharmacist (Rheumatology and Pulmonology)

## 2022-04-17 NOTE — Patient Instructions (Signed)
Vaccines You are taking a medication(s) that can suppress your immune system.  The following immunizations are recommended: Flu annually Covid-19  RSV Td/Tdap (tetanus, diphtheria, pertussis) every 10 years Pneumonia (Prevnar 15 then Pneumovax 23 at least 1 year apart.  Alternatively, can take Prevnar 20 without needing additional dose) Shingrix: 2 doses from 4 weeks to 6 months apart  Please check with your PCP to make sure you are up to date.   If you have signs or symptoms of an infection or start antibiotics: First, call your PCP for workup of your infection. Hold your medication through the infection, until you complete your antibiotics, and until symptoms resolve if you take the following: Injectable medication (Actemra, Benlysta, Cimzia, Cosentyx, Enbrel, Humira, Kevzara, Orencia, Remicade, Simponi, Stelara, Taltz, Tremfya) Methotrexate Leflunomide (Arava) Mycophenolate (Cellcept) Morrie Sheldon, Olumiant, or Rinvoq

## 2022-04-17 NOTE — Telephone Encounter (Signed)
-----   Message from [facility] [place] sent at 04/17/2022  1:46 PM EST -----  ----- Message ----- From: Carole Binning, LPN Sent: [id number]   1:35 PM EST To: Rx Rheum/Pulm  Patient is due for next Actemra on April 30, 2022. Per Dr. Estanislado Pandy, patient needs a TB Gold and uric acid drawn with her infusion. Thanks!

## 2022-04-19 ENCOUNTER — Telehealth: Payer: Self-pay | Admitting: *Deleted

## 2022-04-19 NOTE — Telephone Encounter (Signed)
Received DEXA results from [facility].  Date of Scan: 10/03/2021  Lowest T-score:-0.6  BMD:0.827  Lowest site measured:Forearm Radius  DX: WNL  Significant changes in BMD and site measured (5% and above):n/a  Current Regimen:n/a  Recommendation:Repeat in 5 years.   Reviewed by:Dr. Bo Merino   Next Appointment:  07/17/2022

## 2022-04-30 ENCOUNTER — Other Ambulatory Visit: Payer: Self-pay | Admitting: Pharmacist

## 2022-04-30 ENCOUNTER — Ambulatory Visit (HOSPITAL_COMMUNITY)
Admission: RE | Admit: 2022-04-30 | Discharge: 2022-04-30 | Disposition: A | Discharge status: Home / Self Care | Payer: Medicare Other | Source: Ambulatory Visit | Attending: Rheumatology | Admitting: Rheumatology

## 2022-04-30 VITALS — BP 128/63 | HR 62 | Temp 98.7°F | Resp 18 | Wt 252.0 lb

## 2022-04-30 DIAGNOSIS — Z111 Encounter for screening for respiratory tuberculosis: Secondary | ICD-10-CM | POA: Diagnosis not present

## 2022-04-30 DIAGNOSIS — Z79899 Other long term (current) drug therapy: Secondary | ICD-10-CM

## 2022-04-30 DIAGNOSIS — M0579 Rheumatoid arthritis with rheumatoid factor of multiple sites without organ or systems involvement: Secondary | ICD-10-CM | POA: Diagnosis not present

## 2022-04-30 DIAGNOSIS — M05731 Rheumatoid arthritis with rheumatoid factor of right wrist without organ or systems involvement: Secondary | ICD-10-CM | POA: Insufficient documentation

## 2022-04-30 LAB — CBC WITH DIFFERENTIAL/PLATELET
Abs Immature Granulocytes: 0.04 10*3/uL (ref 0.00–0.07)
Basophils Absolute: 0 10*3/uL (ref 0.0–0.1)
Basophils Relative: 0 %
Eosinophils Absolute: 0.2 10*3/uL (ref 0.0–0.5)
Eosinophils Relative: 2 %
HCT: 37 % (ref 36.0–46.0)
Hemoglobin: 12.8 g/dL (ref 12.0–15.0)
Immature Granulocytes: 0 %
Lymphocytes Relative: 14 %
Lymphs Abs: 1.5 10*3/uL (ref 0.7–4.0)
MCH: 33.5 pg (ref 26.0–34.0)
MCHC: 34.6 g/dL (ref 30.0–36.0)
MCV: 96.9 fL (ref 80.0–100.0)
Monocytes Absolute: 0.7 10*3/uL (ref 0.1–1.0)
Monocytes Relative: 7 %
Neutro Abs: 8.1 10*3/uL — ABNORMAL HIGH (ref 1.7–7.7)
Neutrophils Relative %: 77 %
Platelets: 258 10*3/uL (ref 150–400)
RBC: 3.82 MIL/uL — ABNORMAL LOW (ref 3.87–5.11)
RDW: 13.1 % (ref 11.5–15.5)
WBC: 10.6 10*3/uL — ABNORMAL HIGH (ref 4.0–10.5)
nRBC: 0 % (ref 0.0–0.2)

## 2022-04-30 LAB — COMPREHENSIVE METABOLIC PANEL
ALT: 23 U/L (ref 0–44)
AST: 21 U/L (ref 15–41)
Albumin: 3.8 g/dL (ref 3.5–5.0)
Alkaline Phosphatase: 56 U/L (ref 38–126)
Anion gap: 9 (ref 5–15)
BUN: 29 mg/dL — ABNORMAL HIGH (ref 8–23)
CO2: 23 mmol/L (ref 22–32)
Calcium: 9.4 mg/dL (ref 8.9–10.3)
Chloride: 107 mmol/L (ref 98–111)
Creatinine, Ser: 1.49 mg/dL — ABNORMAL HIGH (ref 0.44–1.00)
GFR, Estimated: 37 mL/min — ABNORMAL LOW (ref >60)
Glucose, Bld: 104 mg/dL — ABNORMAL HIGH (ref 70–99)
Potassium: 3.7 mmol/L (ref 3.5–5.1)
Sodium: 139 mmol/L (ref 135–145)
Total Bilirubin: 1.4 mg/dL — ABNORMAL HIGH (ref 0.3–1.2)
Total Protein: 6 g/dL — ABNORMAL LOW (ref 6.5–8.1)

## 2022-04-30 LAB — URIC ACID: Uric Acid, Serum: 10.7 mg/dL — ABNORMAL HIGH (ref 2.5–7.1)

## 2022-04-30 MED ORDER — TOCILIZUMAB 400 MG/20ML IV SOLN
1000.0000 mg | INTRAVENOUS | Status: DC
  Administered 2022-04-30: 1000 mg via INTRAVENOUS
  Filled 2022-04-30: qty 50

## 2022-04-30 MED ORDER — DIPHENHYDRAMINE HCL 25 MG PO CAPS: 25.0000 mg | ORAL_CAPSULE | ORAL | Status: DC

## 2022-04-30 MED ORDER — ACETAMINOPHEN 325 MG PO TABS: 650.0000 mg | ORAL_TABLET | ORAL | Status: DC

## 2022-04-30 NOTE — Progress Notes (Signed)
Creatinine is elevated and stable.  Hemoglobin is low but improved.  Please forward results to her PCP.

## 2022-04-30 NOTE — Progress Notes (Signed)
Next infusion scheduled for Actemra IV on 05/28/22 and due for updated orders. Diagnosis: RA  Dose: 8mg /kg every 4 weeks  Last Clinic Visit: 04/17/22 Next Clinic Visit: 07/17/22  Last infusion: 04/30/22  Labs: CBC CMP on 04/30/22  -stable TB Gold: negative on 04/30/2022   Orders placed for Actemra IV x 3 doses along with premedication of acetaminophen and diphenhydramine to be administered 30 minutes before medication infusion.  Standing CBC with diff/platelet and CMP with GFR orders placed to be drawn every 2 months.  Next TB gold due 04/30/2023  Knox Saliva, PharmD, MPH, BCPS, CPP Clinical Pharmacist (Rheumatology and Pulmonology)

## 2022-04-30 NOTE — Progress Notes (Signed)
Uric acid is elevated-10.7.  ideally her uric acid should be less than 6.  Avoid a purine rich diet.

## 2022-05-06 LAB — QUANTIFERON-TB GOLD PLUS (RQFGPL)
QuantiFERON Mitogen Value: >10 IU/mL
QuantiFERON Nil Value: 0.04 IU/mL
QuantiFERON TB1 Ag Value: 0.06 IU/mL
QuantiFERON TB2 Ag Value: 0.06 IU/mL

## 2022-05-06 LAB — QUANTIFERON-TB GOLD PLUS: QuantiFERON-TB Gold Plus: NEGATIVE

## 2022-05-06 NOTE — Progress Notes (Signed)
TB gold negative

## 2022-05-07 DIAGNOSIS — D510 Vitamin B12 deficiency anemia due to intrinsic factor deficiency: Secondary | ICD-10-CM | POA: Diagnosis not present

## 2022-05-22 DIAGNOSIS — N1832 Chronic kidney disease, stage 3b: Secondary | ICD-10-CM | POA: Diagnosis not present

## 2022-05-22 DIAGNOSIS — M059 Rheumatoid arthritis with rheumatoid factor, unspecified: Secondary | ICD-10-CM | POA: Diagnosis not present

## 2022-05-22 DIAGNOSIS — I129 Hypertensive chronic kidney disease with stage 1 through stage 4 chronic kidney disease, or unspecified chronic kidney disease: Secondary | ICD-10-CM | POA: Diagnosis not present

## 2022-05-28 ENCOUNTER — Encounter (HOSPITAL_COMMUNITY)
Admission: RE | Admit: 2022-05-28 | Discharge: 2022-05-28 | Disposition: A | Discharge status: Home / Self Care | Payer: Medicare Other | Source: Ambulatory Visit | Attending: Rheumatology | Admitting: Rheumatology

## 2022-05-28 DIAGNOSIS — Z79899 Other long term (current) drug therapy: Secondary | ICD-10-CM

## 2022-05-28 DIAGNOSIS — M0579 Rheumatoid arthritis with rheumatoid factor of multiple sites without organ or systems involvement: Secondary | ICD-10-CM | POA: Diagnosis not present

## 2022-05-28 MED ORDER — ACETAMINOPHEN 325 MG PO TABS: 650.0000 mg | ORAL_TABLET | ORAL | Status: DC

## 2022-05-28 MED ORDER — DIPHENHYDRAMINE HCL 25 MG PO CAPS: 25.0000 mg | ORAL_CAPSULE | ORAL | Status: DC

## 2022-05-28 MED ORDER — TOCILIZUMAB 400 MG/20ML IV SOLN
8.0000 mg/kg | INTRAVENOUS | Status: DC
  Administered 2022-05-28: 914 mg via INTRAVENOUS
  Filled 2022-05-28: qty 40

## 2022-05-30 DIAGNOSIS — N1831 Chronic kidney disease, stage 3a: Secondary | ICD-10-CM | POA: Diagnosis not present

## 2022-05-30 DIAGNOSIS — Z713 Dietary counseling and surveillance: Secondary | ICD-10-CM | POA: Diagnosis not present

## 2022-06-03 ENCOUNTER — Other Ambulatory Visit: Payer: Self-pay | Admitting: Allergy and Immunology

## 2022-06-04 ENCOUNTER — Telehealth: Payer: Self-pay | Admitting: Allergy and Immunology

## 2022-06-04 MED ORDER — ALBUTEROL SULFATE HFA 108 (90 BASE) MCG/ACT IN AERS: 2.0000 | INHALATION_SPRAY | RESPIRATORY_TRACT | 1 refills | Status: DC | PRN

## 2022-06-04 MED ORDER — FLUTICASONE PROPIONATE 50 MCG/ACT NA SUSP: 1.0000 | Freq: Every day | NASAL | 2 refills | Status: DC

## 2022-06-04 NOTE — Telephone Encounter (Signed)
Ventolin and flonase refilled.

## 2022-06-04 NOTE — Telephone Encounter (Signed)
Patient states Albuterol Sulfate is not being covered by [employer] but Ventolin is. She wants to know if this can be sent in if Dr. Lucie Leather is OK with it. She also needs a refill on her Flonase.

## 2022-06-06 ENCOUNTER — Other Ambulatory Visit: Payer: Self-pay | Admitting: Allergy and Immunology

## 2022-06-06 ENCOUNTER — Other Ambulatory Visit: Payer: Self-pay | Admitting: *Deleted

## 2022-06-06 MED ORDER — ALBUTEROL SULFATE HFA 108 (90 BASE) MCG/ACT IN AERS: INHALATION_SPRAY | RESPIRATORY_TRACT | 1 refills | Status: DC

## 2022-06-06 MED ORDER — FLUTICASONE PROPIONATE 50 MCG/ACT NA SUSP: 1.0000 | Freq: Every day | NASAL | 1 refills | Status: DC

## 2022-06-11 DIAGNOSIS — D510 Vitamin B12 deficiency anemia due to intrinsic factor deficiency: Secondary | ICD-10-CM | POA: Diagnosis not present

## 2022-06-25 ENCOUNTER — Encounter (HOSPITAL_COMMUNITY)
Admission: RE | Admit: 2022-06-25 | Discharge: 2022-06-25 | Disposition: A | Discharge status: Home / Self Care | Payer: Medicare Other | Source: Ambulatory Visit | Attending: Rheumatology | Admitting: Rheumatology

## 2022-06-25 DIAGNOSIS — M0579 Rheumatoid arthritis with rheumatoid factor of multiple sites without organ or systems involvement: Secondary | ICD-10-CM

## 2022-06-25 DIAGNOSIS — Z79899 Other long term (current) drug therapy: Secondary | ICD-10-CM | POA: Diagnosis not present

## 2022-06-25 MED ORDER — ACETAMINOPHEN 325 MG PO TABS: 650.0000 mg | ORAL_TABLET | ORAL | Status: DC

## 2022-06-25 MED ORDER — DIPHENHYDRAMINE HCL 25 MG PO CAPS: 25.0000 mg | ORAL_CAPSULE | ORAL | Status: DC

## 2022-06-25 MED ORDER — TOCILIZUMAB 400 MG/20ML IV SOLN
8.0000 mg/kg | INTRAVENOUS | Status: DC
  Administered 2022-06-25: 914 mg via INTRAVENOUS
  Filled 2022-06-25: qty 40

## 2022-06-28 DIAGNOSIS — E785 Hyperlipidemia, unspecified: Secondary | ICD-10-CM | POA: Diagnosis not present

## 2022-06-28 DIAGNOSIS — D510 Vitamin B12 deficiency anemia due to intrinsic factor deficiency: Secondary | ICD-10-CM | POA: Diagnosis not present

## 2022-06-28 DIAGNOSIS — M069 Rheumatoid arthritis, unspecified: Secondary | ICD-10-CM | POA: Diagnosis not present

## 2022-06-28 DIAGNOSIS — Z792 Long term (current) use of antibiotics: Secondary | ICD-10-CM | POA: Diagnosis not present

## 2022-06-28 DIAGNOSIS — E559 Vitamin D deficiency, unspecified: Secondary | ICD-10-CM | POA: Diagnosis not present

## 2022-06-28 DIAGNOSIS — R600 Localized edema: Secondary | ICD-10-CM | POA: Diagnosis not present

## 2022-06-28 DIAGNOSIS — R739 Hyperglycemia, unspecified: Secondary | ICD-10-CM | POA: Diagnosis not present

## 2022-06-28 DIAGNOSIS — I10 Essential (primary) hypertension: Secondary | ICD-10-CM | POA: Diagnosis not present

## 2022-06-28 DIAGNOSIS — N1831 Chronic kidney disease, stage 3a: Secondary | ICD-10-CM | POA: Diagnosis not present

## 2022-06-28 DIAGNOSIS — I872 Venous insufficiency (chronic) (peripheral): Secondary | ICD-10-CM | POA: Diagnosis not present

## 2022-06-28 DIAGNOSIS — D539 Nutritional anemia, unspecified: Secondary | ICD-10-CM | POA: Diagnosis not present

## 2022-06-30 ENCOUNTER — Other Ambulatory Visit: Payer: Self-pay | Admitting: Rheumatology

## 2022-07-01 NOTE — Telephone Encounter (Signed)
Last Fill: 04/17/2022  Next Visit: 07/17/2022  Last Visit: 04/17/2022  Dx: Rheumatoid arthritis with rheumatoid factor of multiple sites without organ or systems involvement   Current Dose per office note on 04/17/2022: prednisone 5 mg p.o. daily   Okay to refill Prednisone?

## 2022-07-03 ENCOUNTER — Telehealth: Payer: Self-pay

## 2022-07-03 NOTE — Patient Outreach (Signed)
  Care Coordination   07/03/2022 Name: [name] [name] MRN: [id number] DOB: 1950/08/26   Care Coordination Outreach Attempts:  An unsuccessful telephone outreach was attempted today to offer the patient information about available care coordination services.  Follow Up Plan:  Additional outreach attempts will be made to offer the patient care coordination information and services.   Encounter Outcome:  No Answer   Care Coordination Interventions:  No, not indicated    Rowe Pavy, RN, BSN, [facility] [phone]

## 2022-07-03 NOTE — Progress Notes (Signed)
Office Visit Note  Patient: [name] [name]             Date of Birth: 1951/02/17           MRN: [id number]             PCP: Hurshel Party, NP Referring: Hurshel Party, NP Visit Date: 07/17/2022 Occupation: @GUAROCC @  Subjective:  Medication monitoring   History of Present Illness: [name] [name] is a 72 y.o. female with history of seropositive rheumatoid arthritis and osteoarthritis.  Patient remains on Actemra 8 mg/kg IV infusions q4wk and prednisone 5 mg daily.  She has noticed a significant improvement in her pain level and functionality since adding on low dose prednisone daily.  She continues to have intermittent discomfort in her hands typically with overuse activities.  Inflammation has been better controlled.  The rheumatoid nodule on her right elbow has subsided in size.  She continues to use a cane to assist with ambulation.  She has significant pedal edema in bilateral LE.   She denies any recurrent or recent infections.    Activities of Daily Living:  Patient reports morning stiffness for 0  none .   Patient Denies nocturnal pain.  Difficulty dressing/grooming: Denies Difficulty climbing stairs: Reports Difficulty getting out of chair: Denies Difficulty using hands for taps, buttons, cutlery, and/or writing: Denies  Review of Systems  Constitutional:  Negative for fatigue.  HENT:  Negative for mouth sores and mouth dryness.   Eyes:  Positive for dryness.  Respiratory:  Negative for shortness of breath.   Cardiovascular:  Negative for chest pain and palpitations.  Gastrointestinal:  Negative for blood in stool, constipation and diarrhea.  Endocrine: Negative for increased urination.  Genitourinary:  Positive for involuntary urination.  Musculoskeletal:  Positive for joint pain, gait problem, joint pain, joint swelling, myalgias and myalgias. Negative for muscle weakness, morning stiffness and muscle tenderness.  Skin:  Positive for hair loss. Negative for color change,  rash and sensitivity to sunlight.  Allergic/Immunologic: Negative for susceptible to infections.  Neurological:  Negative for dizziness and headaches.  Hematological:  Positive for bruising/bleeding tendency. Negative for swollen glands.  Psychiatric/Behavioral:  Positive for sleep disturbance. Negative for depressed mood. The patient is not nervous/anxious.     PMFS History:  Patient Active Problem List   Diagnosis Date Noted   Primary osteoarthritis of both knees 04/17/2022   History of hyperlipidemia 04/17/2022   Stage 3a chronic kidney disease (HCC) 04/17/2022   Dyspnea 11/05/2013   Rheumatoid arthritis (HCC)    Hypertension    GERD (gastroesophageal reflux disease)    Morbid obesity (HCC) 03/10/2013   S/P right THA, AA 03/09/2013    Past Medical History:  Diagnosis Date   Deviated nasal septum    right side, cannot breathe out of right septum   Episcleritis periodica fugax of both eyes    GERD (gastroesophageal reflux disease)    Hypertension    Kidney disease    Pinguecula of left eye    Plantar fasciitis    Rheumatoid arthritis (HCC)    [facility], [facility]   Spondylitis (HCC)    Stasis dermatitis    UTI (lower urinary tract infection) 03/02/2013   treated with keflex for hip surgery on 03/09/13    Family History  Problem Relation Age of Onset   Heart disease Father    Kidney disease Father    Heart attack Father    Heart disease Brother  Cancer Brother        agent orange    Rheum arthritis Maternal Aunt    Hypertension Mother    Alzheimer's disease Mother    Heart attack Brother    Vascular Disease Brother    Kidney disease Brother    Past Surgical History:  Procedure Laterality Date   BIOPSY BREAST Left    [facility]   BREAST BIOPSY Right 11/16/2020   COLON BIOPSY  04/2020   COLONOSCOPY N/A 04/21/2015   DIAGNOSTIC MAMMOGRAM Bilateral 04/05/2016   ingrown toenail removed  5-6 yrs ago   toenail removal Left    TOTAL HIP  ARTHROPLASTY Right 03/09/2013   Procedure: RIGHT TOTAL HIP ARTHROPLASTY ANTERIOR APPROACH;  Surgeon: Shelda Pal, MD;  Location: [facility];  Service: Orthopedics;  Laterality: Right;   Social History   Social History Narrative   Not on file   Immunization History  Administered Date(s) Administered   Influenza Split 12/26/2012   Influenza-Unspecified 11/29/2013, 11/07/2020, 11/13/2021   Moderna Covid-19 Vaccine Bivalent Booster 66yrs & up 06/29/2021, 12/12/2021   Moderna Sars-Covid-2 Vaccination 04/01/2019, 04/27/2019, 10/12/2019, 06/22/2020, 11/07/2020   Pneumococcal Polysaccharide-23 03/11/2013   Rsv, Bivalent, Protein Subunit Rsvpref,pf Verdis Frederickson) 11/13/2021   Zoster Recombinat (Shingrix) 07/08/2017, 12/30/2017     Objective: Vital Signs: BP 110/77 (BP Location: Left Arm, Patient Position: Sitting, Cuff Size: Normal)   Pulse 66   Resp 17   Ht 5\' 2"  (1.575 m)   Wt 245 lb (111.1 kg)   BMI 44.81 kg/m    Physical Exam Vitals and nursing note reviewed.  Constitutional:      Appearance: She is well-developed.  HENT:     Head: Normocephalic and atraumatic.  Eyes:     Conjunctiva/sclera: Conjunctivae normal.  Cardiovascular:     Rate and Rhythm: Normal rate and regular rhythm.     Heart sounds: Normal heart sounds.  Pulmonary:     Effort: Pulmonary effort is normal.     Breath sounds: Normal breath sounds.  Abdominal:     General: Bowel sounds are normal.     Palpations: Abdomen is soft.  Musculoskeletal:     Cervical back: Normal range of motion.     Right lower leg: Edema present.     Left lower leg: Edema present.  Lymphadenopathy:     Cervical: No cervical adenopathy.  Skin:    General: Skin is warm and dry.     Capillary Refill: Capillary refill takes less than 2 seconds.  Neurological:     Mental Status: She is alert and oriented to person, place, and time.  Psychiatric:        Behavior: Behavior normal.      Musculoskeletal Exam: C-spine has good range of  motion.  Thoracic kyphosis noted.  Lumbar spine difficult to assess in seated position.  Shoulder joint abduction to about 110 degrees bilaterally.  Left elbow joint flexion contracture.  No tenderness or inflammation along the elbow joint line.  Small rheumatoid nodule noted on the extensor surface of the right elbow.  Wrist joints have slightly limited extension.  Thickening of all MCP joints but no tenderness or synovitis noted today.  PIP and DIP thickening noted.  Rheumatoid nodule noted over the right third PIP.  Right first DIP has a possible tophi overlying.  Hip joints difficult to assess in seated position.  Knee joints have good range of motion with no warmth or effusion.  Significant pedal edema noted in bilateral lower legs.  CDAI Exam: CDAI  Score: -- Patient Global: 3 mm; Provider Global: 3 mm Swollen: --; Tender: -- Joint Exam 07/17/2022   No joint exam has been documented for this visit   There is currently no information documented on the homunculus. Go to the Rheumatology activity and complete the homunculus joint exam.  Investigation: No additional findings.  Imaging: No results found.  Recent Labs: Lab Results  Component Value Date   WBC 10.6 (H) 04/30/2022   HGB 12.8 04/30/2022   PLT 258 04/30/2022   NA 139 04/30/2022   K 3.7 04/30/2022   CL 107 04/30/2022   CO2 23 04/30/2022   GLUCOSE 104 (H) 04/30/2022   BUN 29 (H) 04/30/2022   CREATININE 1.49 (H) 04/30/2022   BILITOT 1.4 (H) 04/30/2022   ALKPHOS 56 04/30/2022   AST 21 04/30/2022   ALT 23 04/30/2022   PROT 6.0 (L) 04/30/2022   ALBUMIN 3.8 04/30/2022   CALCIUM 9.4 04/30/2022   GFRAA 48 (L) 10/28/2019   QFTBGOLDPLUS Negative 04/30/2022    Speciality Comments: HCQ-SOB, MTX-SOB, Arava-SOB, Humira-SOB, forgetfullness, Enbrel-Painful injection Actemra-03/2014  Procedures:  No procedures performed Allergies: Allopurinol, Asmanex (120 metered doses) [mometasone furoate], Guaifenesin & derivatives,  Methotrexate derivatives, Methylprednisolone, Monosodium glutamate, Plaquenil [hydroxychloroquine sulfate], Prednisone, Prevnar 13 [pneumococcal 13-val conj vacc], Shingrix [zoster vac recomb adjuvanted], Tetanus toxoids, Baclofen, Ciprofloxacin, Epiceram, Finasteride, Humira [adalimumab], Keratin, Leflunomide, Other, Polysporin [bacitracin-polymyxin b], Rosuvastatin, Vitamin e, Neosporin [neomycin-bacitracin zn-polymyx], and Sulfa antibiotics   Assessment / Plan:     Visit Diagnoses: Rheumatoid arthritis with rheumatoid factor of multiple sites without organ or systems involvement (HCC) - - Dx 20 years ago, Followed by Dr. Kathi Ludwig. 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 ESR 18, CRP<1: She has noticed a significant improvement in her quality of life since initiating low-dose prednisone 5 mg 1 tablet daily.  She remains on Actemra 8 mg/kg IV infusions every 4 weeks which she has found to be helpful at managing her rheumatoid arthritis.  She has no active synovitis at this time.  The size of the rheumatoid nodule only since her surface of her right elbow has subsided.  She has been trying to avoid overuse or repetitive activities which has been helpful to minimize her hand pain.  No medication changes will be made at this time.  She is aware of the risks of long-term prednisone use.  She will remain on Actemra as prescribed.  She is due for her next Actemra dose on 07/23/2022.  She is advised to notify us if she develops signs or symptoms of a flare.  She will follow-up in the office in 3 months or sooner if needed.  High risk medication use - Actemra 8 mg/kg IV infusions q4wk (started in 2016). She remains on Prednisone 5 mg 1 tablet by mouth daily.  d/cHCQ-SOB, MTX-SOB, Arava-SOB, Humira-SOB, forgetfullness, Enbrel-Painful inj. Her next Actemra infusion is scheduled on 07/23/2022. CBC and CMP were updated on 04/30/2022. TB Gold negative on 04/30/2022.   No recent or recurrent infections.  Discussed the importance of holding  actemra if she develops signs or symptoms of an infection and to resume once the infection has completely cleared.  Current chronic use of systemic steroids - She remains on prednisone 5 mg 1 tablet by mouth daily.  Discussed the risks of long-term prednisone use.  Elevated serum creatinine: Under the care of nephrology.  Stable.  Creatinine was 1.49 and GFR was 37 on 04/30/2022.  She has been avoiding the use of NSAIDs.  Positive TB test: TB Gold negative on 04/30/2022.  Pain in both hands: She has PIP and DIP thickening consistent with osteoarthritis of both hands.  Thickening over MCP joints but no active synovitis noted.  Slightly limited extension of both wrist joints but no tenderness or inflammation noted on examination today.  Discussed the importance of joint protection and muscle strengthening.  Her hand pain has been more manageable since initiating low-dose prednisone 5 mg 1 tablet daily.  No medication changes will be made at this time.  Status post total hip replacement, right: Doing well.  Using a cane to assist with ambulation.  Primary osteoarthritis of both knees: Good range of motion of both knee joints on examination today.  No warmth or effusion noted.  Using a cane to assist with ambulation.  Pain in thoracic spine: Thoracic kyphosis noted.  Chronic bilateral low back pain without sciatica: She is not experiencing any increased discomfort in her lower back at this time.  No symptoms of sciatica.  Other medical conditions are listed as follows:   Essential hypertension: Blood pressure is 110/77 today in the office.  History of hyperlipidemia  Stage 3a chronic kidney disease (HCC)  Family history of rheumatoid arthritis  Former smoker  History of gastroesophageal reflux (GERD)  History of anemia  Pulmonary nodules  Orders: No orders of the defined types were placed in this encounter.  No orders of the defined types were placed in this encounter.    Follow-Up  Instructions: Return in about 3 months (around 10/17/2022) for Rheumatoid arthritis, Osteoarthritis.   Gearldine Bienenstock, PA-C  Note - This record has been created using Dragon software.  Chart creation errors have been sought, but may not always  have been located. Such creation errors do not reflect on  the standard of medical care.

## 2022-07-04 ENCOUNTER — Telehealth: Payer: Self-pay

## 2022-07-04 NOTE — Patient Outreach (Signed)
  Care Coordination   Initial Visit Note   07/04/2022 Name: [name] [name] MRN: [id number] DOB: Dec 01, 1950  [name] [name] is [name] 72 y.o. year old female who sees [name] [name], [name] A, NP for primary care. I spoke with  [name] [name] by phone today.  What matters to the patients health and wellness today?  Incoming call from patient today. I explained [facility] care coordination program with patient. Patient reports that she is self managing well. Reports she would like to have her infusions for her RA done locally.      Goals Addressed   None     SDOH assessments and interventions completed:  No     Care Coordination Interventions:  Yes, provided   Follow up plan:  awaiting [name] response from leadership    Encounter Outcome:  Pt. Visit Completed   [name] Pavy, RN, BSN, CEN [facility] Coordinator [phone]

## 2022-07-04 NOTE — Patient Outreach (Signed)
  Care Coordination   07/04/2022 Name: [name] [name] MRN: [id number] DOB: 06-19-1950   Care Coordination Outreach Attempts:  A second unsuccessful outreach was attempted today to offer the patient with information about available care coordination services.  Follow Up Plan:  Additional outreach attempts will be made to offer the patient care coordination information and services.   Encounter Outcome:  No Answer   Care Coordination Interventions:  No, not indicated    Rowe Pavy, RN, BSN, [facility] [phone]

## 2022-07-10 ENCOUNTER — Telehealth: Payer: Self-pay

## 2022-07-10 NOTE — Patient Outreach (Signed)
  Care Coordination   Follow Up Visit Note   07/10/2022 Name: [name] [name] MRN: [id number] DOB: 1950-06-02  [name] [name] is a 72 y.o. year old female who sees Moon, Amy A, NP for primary care. I spoke with  [name] by phone today.  What matters to the patients health and wellness today?  Follow up call to patient to inform her that the [facility] that will be opening in November 2024 will be able to accommodate her need for monthly infusions for RA.  Patient is thrilled.   Encouraged patient to call me back if she needs assistance with getting this set up.  Patient is very thankful./        SDOH assessments and interventions completed:  No     Care Coordination Interventions:  Yes, provided   Interventions Today    Flowsheet Row Most Recent Value  Chronic Disease   Chronic disease during today's visit Other  [RA]  General Interventions   General Interventions Discussed/Reviewed Communication with  Communication with --  [cancer center and [facility] leadership]  Education Interventions   Education Provided Provided Education  [Reviewed with patient that she would be able to get her infusions through the [facility] on [address] in November.]        Follow up plan: No further intervention required.   Encounter Outcome:  Pt. Visit Completed   Rowe Pavy, RN, BSN, CEN [facility] Coordinator [phone]

## 2022-07-11 DIAGNOSIS — D510 Vitamin B12 deficiency anemia due to intrinsic factor deficiency: Secondary | ICD-10-CM | POA: Diagnosis not present

## 2022-07-17 ENCOUNTER — Encounter: Payer: Medicare Other | Attending: Rheumatology | Admitting: Physician Assistant

## 2022-07-17 ENCOUNTER — Encounter: Payer: Self-pay | Admitting: Physician Assistant

## 2022-07-17 VITALS — BP 110/77 | HR 66 | Resp 17 | Ht 62.0 in | Wt 245.0 lb

## 2022-07-17 DIAGNOSIS — Z862 Personal history of diseases of the blood and blood-forming organs and certain disorders involving the immune mechanism: Secondary | ICD-10-CM | POA: Diagnosis not present

## 2022-07-17 DIAGNOSIS — M79642 Pain in left hand: Secondary | ICD-10-CM | POA: Diagnosis not present

## 2022-07-17 DIAGNOSIS — Z8719 Personal history of other diseases of the digestive system: Secondary | ICD-10-CM

## 2022-07-17 DIAGNOSIS — Z8261 Family history of arthritis: Secondary | ICD-10-CM | POA: Diagnosis not present

## 2022-07-17 DIAGNOSIS — Z79899 Other long term (current) drug therapy: Secondary | ICD-10-CM

## 2022-07-17 DIAGNOSIS — M546 Pain in thoracic spine: Secondary | ICD-10-CM

## 2022-07-17 DIAGNOSIS — N1831 Chronic kidney disease, stage 3a: Secondary | ICD-10-CM | POA: Diagnosis not present

## 2022-07-17 DIAGNOSIS — M545 Low back pain, unspecified: Secondary | ICD-10-CM | POA: Diagnosis not present

## 2022-07-17 DIAGNOSIS — R7989 Other specified abnormal findings of blood chemistry: Secondary | ICD-10-CM

## 2022-07-17 DIAGNOSIS — R7611 Nonspecific reaction to tuberculin skin test without active tuberculosis: Secondary | ICD-10-CM | POA: Diagnosis not present

## 2022-07-17 DIAGNOSIS — G8929 Other chronic pain: Secondary | ICD-10-CM | POA: Diagnosis not present

## 2022-07-17 DIAGNOSIS — I10 Essential (primary) hypertension: Secondary | ICD-10-CM | POA: Diagnosis not present

## 2022-07-17 DIAGNOSIS — M0579 Rheumatoid arthritis with rheumatoid factor of multiple sites without organ or systems involvement: Secondary | ICD-10-CM

## 2022-07-17 DIAGNOSIS — Z7952 Long term (current) use of systemic steroids: Secondary | ICD-10-CM

## 2022-07-17 DIAGNOSIS — M79641 Pain in right hand: Secondary | ICD-10-CM | POA: Diagnosis not present

## 2022-07-17 DIAGNOSIS — Z96641 Presence of right artificial hip joint: Secondary | ICD-10-CM | POA: Diagnosis not present

## 2022-07-17 DIAGNOSIS — R918 Other nonspecific abnormal finding of lung field: Secondary | ICD-10-CM | POA: Diagnosis not present

## 2022-07-17 DIAGNOSIS — M170 Bilateral primary osteoarthritis of knee: Secondary | ICD-10-CM

## 2022-07-17 DIAGNOSIS — Z8639 Personal history of other endocrine, nutritional and metabolic disease: Secondary | ICD-10-CM

## 2022-07-17 DIAGNOSIS — Z87891 Personal history of nicotine dependence: Secondary | ICD-10-CM | POA: Diagnosis not present

## 2022-07-23 ENCOUNTER — Other Ambulatory Visit: Payer: Self-pay | Admitting: Pharmacist

## 2022-07-23 ENCOUNTER — Ambulatory Visit (HOSPITAL_COMMUNITY)
Admission: RE | Admit: 2022-07-23 | Discharge: 2022-07-23 | Disposition: A | Discharge status: Home / Self Care | Payer: Medicare Other | Source: Ambulatory Visit | Attending: Rheumatology | Admitting: Rheumatology

## 2022-07-23 DIAGNOSIS — Z79899 Other long term (current) drug therapy: Secondary | ICD-10-CM | POA: Insufficient documentation

## 2022-07-23 DIAGNOSIS — M0579 Rheumatoid arthritis with rheumatoid factor of multiple sites without organ or systems involvement: Secondary | ICD-10-CM

## 2022-07-23 LAB — COMPREHENSIVE METABOLIC PANEL
ALT: 23 U/L (ref 0–44)
AST: 24 U/L (ref 15–41)
Albumin: 3.6 g/dL (ref 3.5–5.0)
Alkaline Phosphatase: 46 U/L (ref 38–126)
Anion gap: 6 (ref 5–15)
BUN: 35 mg/dL — ABNORMAL HIGH (ref 8–23)
CO2: 22 mmol/L (ref 22–32)
Calcium: 8.7 mg/dL — ABNORMAL LOW (ref 8.9–10.3)
Chloride: 110 mmol/L (ref 98–111)
Creatinine, Ser: 1.58 mg/dL — ABNORMAL HIGH (ref 0.44–1.00)
GFR, Estimated: 35 mL/min — ABNORMAL LOW (ref >60)
Glucose, Bld: 101 mg/dL — ABNORMAL HIGH (ref 70–99)
Potassium: 4.1 mmol/L (ref 3.5–5.1)
Sodium: 138 mmol/L (ref 135–145)
Total Bilirubin: 1.8 mg/dL — ABNORMAL HIGH (ref 0.3–1.2)
Total Protein: 5.9 g/dL — ABNORMAL LOW (ref 6.5–8.1)

## 2022-07-23 LAB — CBC WITH DIFFERENTIAL/PLATELET
Abs Immature Granulocytes: 0.03 10*3/uL (ref 0.00–0.07)
Basophils Absolute: 0 10*3/uL (ref 0.0–0.1)
Basophils Relative: 1 %
Eosinophils Absolute: 0.2 10*3/uL (ref 0.0–0.5)
Eosinophils Relative: 2 %
HCT: 38.1 % (ref 36.0–46.0)
Hemoglobin: 12.6 g/dL (ref 12.0–15.0)
Immature Granulocytes: 0 %
Lymphocytes Relative: 20 %
Lymphs Abs: 1.7 10*3/uL (ref 0.7–4.0)
MCH: 33.4 pg (ref 26.0–34.0)
MCHC: 33.1 g/dL (ref 30.0–36.0)
MCV: 101.1 fL — ABNORMAL HIGH (ref 80.0–100.0)
Monocytes Absolute: 0.7 10*3/uL (ref 0.1–1.0)
Monocytes Relative: 8 %
Neutro Abs: 5.9 10*3/uL (ref 1.7–7.7)
Neutrophils Relative %: 69 %
Platelets: 244 10*3/uL (ref 150–400)
RBC: 3.77 MIL/uL — ABNORMAL LOW (ref 3.87–5.11)
RDW: 12.7 % (ref 11.5–15.5)
WBC: 8.6 10*3/uL (ref 4.0–10.5)
nRBC: 0 % (ref 0.0–0.2)

## 2022-07-23 MED ORDER — TOCILIZUMAB 400 MG/20ML IV SOLN
8.0000 mg/kg | INTRAVENOUS | Status: DC
  Administered 2022-07-23: 914 mg via INTRAVENOUS
  Filled 2022-07-23: qty 10

## 2022-07-23 MED ORDER — ACETAMINOPHEN 325 MG PO TABS: 650.0000 mg | ORAL_TABLET | ORAL | Status: DC

## 2022-07-23 MED ORDER — DIPHENHYDRAMINE HCL 25 MG PO CAPS: 25.0000 mg | ORAL_CAPSULE | ORAL | Status: DC

## 2022-07-23 NOTE — Progress Notes (Signed)
CBC stable

## 2022-07-23 NOTE — Progress Notes (Signed)
Creatinine remains elevated-1.58 and GFR low-35. Avoid the use of NSAIDs. Forward results to nephrologist.

## 2022-07-23 NOTE — Progress Notes (Signed)
Next infusion scheduled for Actemra IV on 08/20/22 and due for updated orders. Diagnosis: RA  Dose: 8 mg/kg every 4 weeks  Last Clinic Visit: 07/17/22 Next Clinic Visit: 10/17/22  Last infusion: 07/23/22  Labs: CBC and CMP on 07/23/22 -patient does not take NSAID's,patient verbalized understanding,  Creatinine remains elevated-1.58 and GFR low-35. Avoid the use of NSAIDs. Results forwarded to [facility]. CBC table TB Gold: negative on 04/30/22   Orders placed for Actemra IV x 3 doses along with premedication of acetaminophen and diphenhydramine to be administered 30 minutes before medication infusion.  Standing CBC with diff/platelet and CMP with GFR orders placed to be drawn every 2 months.  Next TB gold due 04/30/23  Chesley Mires, PharmD, MPH, BCPS, CPP Clinical Pharmacist (Rheumatology and Pulmonology)

## 2022-07-24 MED FILL — Tocilizumab IV Inj 400 MG/20ML: INTRAVENOUS | Qty: 4.3 | Status: AC

## 2022-07-25 DIAGNOSIS — N1831 Chronic kidney disease, stage 3a: Secondary | ICD-10-CM | POA: Diagnosis not present

## 2022-07-25 DIAGNOSIS — R92323 Mammographic fibroglandular density, bilateral breasts: Secondary | ICD-10-CM | POA: Diagnosis not present

## 2022-07-25 DIAGNOSIS — Z713 Dietary counseling and surveillance: Secondary | ICD-10-CM | POA: Diagnosis not present

## 2022-07-25 DIAGNOSIS — R928 Other abnormal and inconclusive findings on diagnostic imaging of breast: Secondary | ICD-10-CM | POA: Diagnosis not present

## 2022-08-12 ENCOUNTER — Encounter: Payer: Self-pay | Admitting: Allergy and Immunology

## 2022-08-12 ENCOUNTER — Ambulatory Visit (INDEPENDENT_AMBULATORY_CARE_PROVIDER_SITE_OTHER): Payer: Medicare Other | Admitting: Allergy and Immunology

## 2022-08-12 VITALS — BP 122/60 | HR 64 | Resp 10 | Ht 60.7 in | Wt 246.4 lb

## 2022-08-12 DIAGNOSIS — M069 Rheumatoid arthritis, unspecified: Secondary | ICD-10-CM | POA: Diagnosis not present

## 2022-08-12 DIAGNOSIS — J3089 Other allergic rhinitis: Secondary | ICD-10-CM | POA: Diagnosis not present

## 2022-08-12 DIAGNOSIS — J4540 Moderate persistent asthma, uncomplicated: Secondary | ICD-10-CM | POA: Diagnosis not present

## 2022-08-12 DIAGNOSIS — M818 Other osteoporosis without current pathological fracture: Secondary | ICD-10-CM

## 2022-08-12 NOTE — Progress Notes (Signed)
[place] - [place] - [place] - [place] - [place]   Follow-up Note  Referring Provider: Hurshel Party, NP Primary Provider: Hurshel Party, NP Date of Office Visit: 08/12/2022  Subjective:   [name] [name] (DOB: 21-Feb-1951) is a 72 y.o. female who [facility] in re-evaluation of the following:  HPI: [name] [name] returns to this clinic in evaluation of asthma and allergic rhinitis.  I last saw her in this clinic 13 August 2021.  She is really doing wonderful with her airway while using Flonase and Arnuity just 3 times per week and she has not had an exacerbation of her airway disease requiring a systemic steroid or an antibiotic and has only used her short acting bronchodilator on 1 occasion over the course of the past year.  During her last visit she was having significant problems with her rheumatoid arthritis and I recommended that she discuss with her rheumatologist about possibly starting an anti-CD20 monoclonal antibody.  Currently she remains on Actemra and has had prednisone added to her treatment regime.  She does receive the flu vaccine every year and plans on doing so this year.  Allergies as of 08/12/2022       Reactions   Allopurinol Shortness Of Breath   Asmanex (120 Metered Doses) [mometasone Furoate] Shortness Of Breath   Guaifenesin & Derivatives Shortness Of Breath   MUCINEX   Methotrexate Derivatives Shortness Of Breath   Methylprednisolone Shortness Of Breath   Monosodium Glutamate Shortness Of Breath   Plaquenil [hydroxychloroquine Sulfate] Shortness Of Breath   Prednisone Shortness Of Breath   Prevnar 13 [pneumococcal 13-val Conj Vacc] Shortness Of Breath   Shingrix [zoster Vac Recomb Adjuvanted] Shortness Of Breath   Tetanus Toxoids Shortness Of Breath   Baclofen    Confusion, muscle weakness, forgetfulness, hallucinations, loss of appetite, headaches    Ciprofloxacin Other (See Comments)   Breathing and  swallowing after two doses of medications   Epiceram    Finasteride    Humira [adalimumab]    SOB. Tolerates Actemra   Keratin    Keratin oil   Leflunomide    SOB   Other    AQUAPHOR   Polysporin [bacitracin-polymyxin B]    Rosuvastatin    Vitamin E    Vitamin e oil   Neosporin [neomycin-bacitracin Zn-polymyx] Rash   Sulfa Antibiotics Itching, Rash        Medication List    acetaminophen 500 MG tablet Commonly known as: TYLENOL Take 500 mg by mouth as needed.   ACTEMRA IV Inject into the vein every 28 (twenty-eight) days. 8mg /kg every 28 days   albuterol 108 (90 Base) MCG/ACT inhaler Commonly known as: VENTOLIN HFA INHALE 2 PUFFS EVERY 4 TO 6 HOURS AS NEEDED FOR COUGH OR WHEEZE   Arnuity Ellipta 200 MCG/ACT Aepb Generic drug: Fluticasone Furoate INHALE ONE PUFF ONCE DAILY. RINSE MOUTH AFTER USE.   ASPERCREME LIDOCAINE EX Apply topically.   atenolol 50 MG tablet Commonly known as: TENORMIN Take 50 mg by mouth every evening.   B-12 COMPLIANCE INJECTION IJ Inject 1 Dose as directed every 30 (thirty) days.   BIOFREEZE EX Apply topically.   carboxymethylcellulose 0.5 % Soln Commonly known as: REFRESH PLUS Apply to eye.   cephALEXin 500 MG capsule Commonly known as: KEFLEX Take 500 mg by mouth 4 (four) times daily. 1 hour prior to procedure (dental)   diphenhydrAMINE 25 MG tablet Commonly known as: BENADRYL Take 25 mg by mouth every 6 (  six) hours as needed for itching or allergies.   ezetimibe 10 MG tablet Commonly known as: ZETIA Take 10 mg by mouth daily.   fluticasone 50 MCG/ACT nasal spray Commonly known as: FLONASE Place 1 spray into both nostrils daily.   GINGER PO Take 550 mg by mouth every morning.   L-Lysine 500 MG Caps Take by mouth daily as needed.   levocetirizine 5 MG tablet Commonly known as: XYZAL Take 5 mg by mouth daily as needed for allergies.   lisinopril-hydrochlorothiazide 20-12.5 MG tablet Commonly known as:  ZESTORETIC Take 1 tablet by mouth every morning.   multivitamin with minerals Tabs tablet Take 1 tablet by mouth daily.   predniSONE 5 MG tablet Commonly known as: DELTASONE TAKE 1 TABLET EVERY DAY WITH BREAKFAST   Red Yeast Rice 600 MG Caps Take 600 mg by mouth 2 (two) times daily.   ZOXWRUEAV RELIEF EX Apply topically.   Turmeric Curcumin 500 MG Caps Take 1,000 mg by mouth 2 (two) times daily.   Vascepa 1 g capsule Generic drug: icosapent Ethyl 2 (TWO) CAPSULE TWO TIMES DAILY   Voltaren 1 % Gel Generic drug: diclofenac Sodium Apply topically as needed.   white petrolatum Gel Commonly known as: VASELINE Apply 1 application  topically as needed for lip care or dry skin.    Past Medical History:  Diagnosis Date  . Deviated nasal septum    right side, cannot breathe out of right septum  . Episcleritis periodica fugax of both eyes   . GERD (gastroesophageal reflux disease)   . Hypertension   . Kidney disease   . Pinguecula of left eye   . Plantar fasciitis   . Rheumatoid arthritis (HCC)    oa and ra in arms, [name] [name]  . Spondylitis (HCC)   . Stasis dermatitis   . UTI (lower urinary tract infection) 03/02/2013   treated with keflex for hip surgery on 03/09/13    Past Surgical History:  Procedure Laterality Date  . BIOPSY BREAST Left    [facility]  . BREAST BIOPSY Right 11/16/2020  . COLON BIOPSY  04/2020  . COLONOSCOPY N/A 04/21/2015  . DIAGNOSTIC MAMMOGRAM Bilateral 04/05/2016  . ingrown toenail removed  5-6 yrs ago  . toenail removal Left   . TOTAL HIP ARTHROPLASTY Right 03/09/2013   Procedure: RIGHT TOTAL HIP ARTHROPLASTY ANTERIOR APPROACH;  Surgeon: Shelda Pal, MD;  Location: [facility];  Service: Orthopedics;  Laterality: Right;    Review of systems negative except as noted in HPI / PMHx or noted below:  Review of Systems  Constitutional: Negative.   HENT: Negative.    Eyes: Negative.   Respiratory: Negative.    Cardiovascular:  Negative.   Gastrointestinal: Negative.   Genitourinary: Negative.   Musculoskeletal: Negative.   Skin: Negative.   Neurological: Negative.   Endo/Heme/Allergies: Negative.   Psychiatric/Behavioral: Negative.       Objective:   Vitals:   08/12/22 1119  BP: 122/60  Pulse: 64  Resp: 10  SpO2: 98%   Height: 5' 0.7" (154.2 cm)  Weight: 246 lb 6.4 oz (111.8 kg)   Physical Exam Constitutional:      Appearance: She is not diaphoretic.  HENT:     Head: Normocephalic.     Right Ear: Tympanic membrane, ear canal and external ear normal.     Left Ear: Tympanic membrane, ear canal and external ear normal.     Nose: Nose normal. No mucosal edema or rhinorrhea.     Mouth/Throat:  Pharynx: Uvula midline. No oropharyngeal exudate.  Eyes:     Conjunctiva/sclera: Conjunctivae normal.  Neck:     Thyroid: No thyromegaly.     Trachea: Trachea normal. No tracheal tenderness or tracheal deviation.  Cardiovascular:     Rate and Rhythm: Normal rate and regular rhythm.     Heart sounds: Normal heart sounds, S1 normal and S2 normal. No murmur heard. Pulmonary:     Effort: No respiratory distress.     Breath sounds: Normal breath sounds. No stridor. No wheezing or rales.  Lymphadenopathy:     Head:     Right side of head: No tonsillar adenopathy.     Left side of head: No tonsillar adenopathy.     Cervical: No cervical adenopathy.  Skin:    Findings: No erythema or rash.     Nails: There is no clubbing.  Neurological:     Mental Status: She is alert.    Diagnostics:    Spirometry was performed and demonstrated an FEV1 of 1.77 at 93 % of predicted.  Assessment and Plan:   1. Asthma, moderate persistent, well-controlled   2. Perennial allergic rhinitis     1.  Continue to Treat and prevent inflammation:   A.  Flonase 1 spray each nostril 3-7 times per week  B.  Arnuity 200 1 inhalation 3-7 times per week  2.  If needed:   A.  Albuterol HFA 1-2 puffs every 4-6 hours if  needed  B.  OTC antihistamine  3.  Return to clinic in 12 months or earlier if problem   Kylaya appears to be doing quite well and she will remain on relatively low-dose inhaled and nasal steroids as this is working just great for her airway issue and we will see her back in this clinic in 12 months or earlier if there is a problem.  She does have a plan to obtain the flu vaccine this fall.  Laurette Schimke, MD Allergy / Immunology [facility]

## 2022-08-12 NOTE — Patient Instructions (Addendum)
  1.  Continue to Treat and prevent inflammation:   A.  Flonase 1 spray each nostril 3-7 times per week  B.  Arnuity 200 1 inhalation 3-7 times per week  2.  If needed:   A.  Albuterol HFA 1-2 puffs every 4-6 hours if needed  B.  OTC antihistamine  3.  Return to clinic in 12 months or earlier if problem   4.  Discuss with rheumatologist about using anti-CD20 monoclonal antibody for rheumatoid arthritis and starting a treatment for preexistent osteoporosis in the setting of oral steroid use.

## 2022-08-13 ENCOUNTER — Encounter: Payer: Self-pay | Admitting: Allergy and Immunology

## 2022-08-13 ENCOUNTER — Telehealth: Payer: Self-pay

## 2022-08-13 NOTE — Telephone Encounter (Signed)
Advised patient that Rituximab is typically reserved for refractory cases of RA.  It is an aggressive treatment that can significantly deplete the immune system.  Dr.  Corliss Skains and I do not currently have any patients on rituximab.   If she is interested in switching to rituximab we can always send her for second opinion [facility] rheumatology) to see if she would be a good candidate.  Patient verbalized understanding and would not like to proceed with a second opinion. Patient states she was just curious as to what the treatment was since it was mentioned to her by someone else.

## 2022-08-13 NOTE — Telephone Encounter (Signed)
Rituximab is typically reserved for refractory cases of RA.  It is an aggressive treatment that can significantly deplete the immune system.  Dr.  Corliss Skains and I do not currently have any patients on rituximab.   If she is interested in switching to rituximab we can always send her for second opinion [facility] rheumatology) to see if she would be a good candidate.

## 2022-08-13 NOTE — Telephone Encounter (Signed)
Patient called and would like to know if CD20 antibody would be an option for her rheumatoid arthritis? Patient states someone told her about this and would like to know if our office has heard of it and if it is an option. Please advise. Thanks!

## 2022-08-14 DIAGNOSIS — D510 Vitamin B12 deficiency anemia due to intrinsic factor deficiency: Secondary | ICD-10-CM | POA: Diagnosis not present

## 2022-08-20 ENCOUNTER — Ambulatory Visit (HOSPITAL_COMMUNITY)
Admission: RE | Admit: 2022-08-20 | Discharge: 2022-08-20 | Disposition: A | Discharge status: Home / Self Care | Payer: Medicare Other | Source: Ambulatory Visit | Attending: Rheumatology | Admitting: Rheumatology

## 2022-08-20 DIAGNOSIS — M0579 Rheumatoid arthritis with rheumatoid factor of multiple sites without organ or systems involvement: Secondary | ICD-10-CM | POA: Diagnosis not present

## 2022-08-20 DIAGNOSIS — Z79899 Other long term (current) drug therapy: Secondary | ICD-10-CM | POA: Diagnosis not present

## 2022-08-20 MED ORDER — ACETAMINOPHEN 325 MG PO TABS: 650.0000 mg | ORAL_TABLET | ORAL | Status: DC

## 2022-08-20 MED ORDER — DIPHENHYDRAMINE HCL 25 MG PO CAPS: 25.0000 mg | ORAL_CAPSULE | ORAL | Status: DC

## 2022-08-20 MED ORDER — TOCILIZUMAB 400 MG/20ML IV SOLN
800.0000 mg | INTRAVENOUS | Status: DC
  Administered 2022-08-20: 800 mg via INTRAVENOUS
  Filled 2022-08-20: qty 40

## 2022-09-06 ENCOUNTER — Other Ambulatory Visit: Payer: Self-pay | Admitting: Physician Assistant

## 2022-09-06 NOTE — Telephone Encounter (Signed)
Last Fill: 07/01/2022  Next Visit: 10/17/2022  Last Visit: 07/17/2022  Dx:  Pain in both hands   Current Dose per office note on 07/17/2022: Prednisone 5 mg 1 tablet by mouth daily.   Okay to refill Prednisone?

## 2022-09-17 ENCOUNTER — Ambulatory Visit (HOSPITAL_COMMUNITY)
Admission: RE | Admit: 2022-09-17 | Discharge: 2022-09-17 | Disposition: A | Discharge status: Home / Self Care | Payer: Medicare Other | Source: Ambulatory Visit | Attending: Rheumatology | Admitting: Rheumatology

## 2022-09-17 DIAGNOSIS — M0579 Rheumatoid arthritis with rheumatoid factor of multiple sites without organ or systems involvement: Secondary | ICD-10-CM | POA: Diagnosis not present

## 2022-09-17 DIAGNOSIS — Z79899 Other long term (current) drug therapy: Secondary | ICD-10-CM | POA: Insufficient documentation

## 2022-09-17 LAB — COMPREHENSIVE METABOLIC PANEL
ALT: 22 U/L (ref 0–44)
AST: 29 U/L (ref 15–41)
Albumin: 3.6 g/dL (ref 3.5–5.0)
Alkaline Phosphatase: 46 U/L (ref 38–126)
Anion gap: 9 (ref 5–15)
BUN: 28 mg/dL — ABNORMAL HIGH (ref 8–23)
CO2: 26 mmol/L (ref 22–32)
Calcium: 9.3 mg/dL (ref 8.9–10.3)
Chloride: 102 mmol/L (ref 98–111)
Creatinine, Ser: 1.45 mg/dL — ABNORMAL HIGH (ref 0.44–1.00)
GFR, Estimated: 38 mL/min — ABNORMAL LOW (ref >60)
Glucose, Bld: 109 mg/dL — ABNORMAL HIGH (ref 70–99)
Potassium: 4 mmol/L (ref 3.5–5.1)
Sodium: 137 mmol/L (ref 135–145)
Total Bilirubin: 1.5 mg/dL — ABNORMAL HIGH (ref 0.3–1.2)
Total Protein: 5.9 g/dL — ABNORMAL LOW (ref 6.5–8.1)

## 2022-09-17 LAB — CBC WITH DIFFERENTIAL/PLATELET
Abs Immature Granulocytes: 0.04 10*3/uL (ref 0.00–0.07)
Basophils Absolute: 0 10*3/uL (ref 0.0–0.1)
Basophils Relative: 1 %
Eosinophils Absolute: 0.2 10*3/uL (ref 0.0–0.5)
Eosinophils Relative: 2 %
HCT: 38 % (ref 36.0–46.0)
Hemoglobin: 13 g/dL (ref 12.0–15.0)
Immature Granulocytes: 1 %
Lymphocytes Relative: 27 %
Lymphs Abs: 2 10*3/uL (ref 0.7–4.0)
MCH: 33.2 pg (ref 26.0–34.0)
MCHC: 34.2 g/dL (ref 30.0–36.0)
MCV: 96.9 fL (ref 80.0–100.0)
Monocytes Absolute: 0.7 10*3/uL (ref 0.1–1.0)
Monocytes Relative: 9 %
Neutro Abs: 4.6 10*3/uL (ref 1.7–7.7)
Neutrophils Relative %: 60 %
Platelets: 281 10*3/uL (ref 150–400)
RBC: 3.92 MIL/uL (ref 3.87–5.11)
RDW: 12.4 % (ref 11.5–15.5)
WBC: 7.6 10*3/uL (ref 4.0–10.5)
nRBC: 0 % (ref 0.0–0.2)

## 2022-09-17 MED ORDER — TOCILIZUMAB 400 MG/20ML IV SOLN
8.0000 mg/kg | INTRAVENOUS | Status: DC
  Administered 2022-09-17: 888 mg via INTRAVENOUS
  Filled 2022-09-17: qty 44.4

## 2022-09-17 MED ORDER — ACETAMINOPHEN 325 MG PO TABS: 650.0000 mg | ORAL_TABLET | ORAL | Status: DC

## 2022-09-17 MED ORDER — DIPHENHYDRAMINE HCL 25 MG PO CAPS: 25.0000 mg | ORAL_CAPSULE | ORAL | Status: DC

## 2022-09-17 NOTE — Progress Notes (Signed)
Glucose is 109.  Creatinine remains elevated but stable-1.45 and GFR low at 38. Avoid the use of NSAIDs.  Forward results to nephrologist.  Total protein remains low but stable.

## 2022-09-17 NOTE — Progress Notes (Signed)
CBC WNL

## 2022-09-18 DIAGNOSIS — D510 Vitamin B12 deficiency anemia due to intrinsic factor deficiency: Secondary | ICD-10-CM | POA: Diagnosis not present

## 2022-10-01 DIAGNOSIS — I872 Venous insufficiency (chronic) (peripheral): Secondary | ICD-10-CM | POA: Diagnosis not present

## 2022-10-01 DIAGNOSIS — E785 Hyperlipidemia, unspecified: Secondary | ICD-10-CM | POA: Diagnosis not present

## 2022-10-01 DIAGNOSIS — D539 Nutritional anemia, unspecified: Secondary | ICD-10-CM | POA: Diagnosis not present

## 2022-10-01 DIAGNOSIS — R739 Hyperglycemia, unspecified: Secondary | ICD-10-CM | POA: Diagnosis not present

## 2022-10-01 DIAGNOSIS — N1831 Chronic kidney disease, stage 3a: Secondary | ICD-10-CM | POA: Diagnosis not present

## 2022-10-01 DIAGNOSIS — D510 Vitamin B12 deficiency anemia due to intrinsic factor deficiency: Secondary | ICD-10-CM | POA: Diagnosis not present

## 2022-10-01 DIAGNOSIS — R600 Localized edema: Secondary | ICD-10-CM | POA: Diagnosis not present

## 2022-10-01 DIAGNOSIS — I10 Essential (primary) hypertension: Secondary | ICD-10-CM | POA: Diagnosis not present

## 2022-10-01 DIAGNOSIS — M069 Rheumatoid arthritis, unspecified: Secondary | ICD-10-CM | POA: Diagnosis not present

## 2022-10-01 DIAGNOSIS — Z792 Long term (current) use of antibiotics: Secondary | ICD-10-CM | POA: Diagnosis not present

## 2022-10-01 DIAGNOSIS — Z139 Encounter for screening, unspecified: Secondary | ICD-10-CM | POA: Diagnosis not present

## 2022-10-08 ENCOUNTER — Telehealth: Payer: Self-pay | Admitting: *Deleted

## 2022-10-08 HISTORY — PX: TOOTH EXTRACTION: SUR596

## 2022-10-08 NOTE — Telephone Encounter (Signed)
Patient contacted the office stating she is on antibiotics until 10/15/2022 for prevention of dental infection. Patient had to have a tooth pulled. Patient states she is scheduled to have her Actemra infusion on 10/15/2022. Patient would like to know when she should reschedule her infusion for. Please advise.

## 2022-10-08 NOTE — Progress Notes (Unsigned)
Office Visit Note  Patient: [name] [name]             Date of Birth: [birth year]           MRN: [id number]             PCP: Hurshel Party, NP Referring: Hurshel Party, NP Visit Date: 10/17/2022 Occupation: @GUAROCC @  Subjective:  Medication monitoring   History of Present Illness: [name] [name] is a 72 y.o. female with history of seropositive rheumatoid arthritis and osteoarthritis.  She remains on Actemra 8 mg/kg IV infusions q4wk (started in 2016). She is also taking Prednisone 5 mg 1 tablet by mouth daily.  Patient denies any recent rheumatoid arthritis flares.  She has noticed improvement in her joint pain and stiffness since initiating prednisone.  She continues to have intermittent discomfort in both hands.  She has been using a cane to assist with ambulation.  Patient underwent a tooth extraction on 10/08/2022 and we received a note from her dentist today on 10/17/2022 stating there was no obvious dental pathology or infection at this time.  Patient plans on resuming Actemra infusions the first week of September.    Activities of Daily Living:  Patient reports morning stiffness for 5-10 minutes.   Patient Denies nocturnal pain.  Difficulty dressing/grooming: Denies Difficulty climbing stairs: Reports Difficulty getting out of chair: Denies Difficulty using hands for taps, buttons, cutlery, and/or writing: Reports  Review of Systems  Constitutional:  Negative for fatigue.  HENT:  Negative for mouth sores and mouth dryness.   Eyes:  Positive for redness and dryness. Negative for pain.  Respiratory:  Positive for shortness of breath. Negative for cough and wheezing.   Cardiovascular:  Negative for chest pain and palpitations.  Gastrointestinal:  Negative for blood in stool, constipation and diarrhea.  Endocrine: Negative for increased urination.  Genitourinary:  Negative for involuntary urination.  Musculoskeletal:  Positive for joint pain, gait problem, joint pain, muscle  weakness and morning stiffness. Negative for joint swelling, myalgias, muscle tenderness and myalgias.  Skin:  Positive for hair loss. Negative for color change, rash and sensitivity to sunlight.  Allergic/Immunologic: Negative for susceptible to infections.  Neurological:  Negative for dizziness and headaches.  Hematological:  Negative for swollen glands.  Psychiatric/Behavioral:  Negative for depressed mood and sleep disturbance. The patient is not nervous/anxious.     PMFS History:  Patient Active Problem List   Diagnosis Date Noted   Primary osteoarthritis of both knees 04/17/2022   History of hyperlipidemia 04/17/2022   Stage 3a chronic kidney disease (HCC) 04/17/2022   Dyspnea 11/05/2013   Rheumatoid arthritis (HCC)    Hypertension    GERD (gastroesophageal reflux disease)    Morbid obesity (HCC) 03/10/2013   S/P right THA, AA 03/09/2013    Past Medical History:  Diagnosis Date   Deviated nasal septum    right side, cannot breathe out of right septum   Episcleritis periodica fugax of both eyes    GERD (gastroesophageal reflux disease)    Hypertension    Kidney disease    Pinguecula of left eye    Plantar fasciitis    Rheumatoid arthritis (HCC)    oa and ra in arms, shane anderson   Spondylitis (HCC)    Stasis dermatitis    UTI (lower urinary tract infection) 03/02/2013   treated with keflex for hip surgery on 03/09/13    Family History  Problem Relation Age of Onset   Hypertension Mother    Alzheimer's  disease Mother    Heart disease Father    Kidney disease Father    Heart attack Father    Heart disease Brother    Cancer Brother        agent orange    Heart attack Brother    Vascular Disease Brother    Kidney disease Brother        stage 4   Rheum arthritis Maternal Aunt    Past Surgical History:  Procedure Laterality Date   BIOPSY BREAST Left    [facility]   BREAST BIOPSY Right 11/16/2020   COLON BIOPSY  04/2020   COLONOSCOPY N/A 04/21/2015    DIAGNOSTIC MAMMOGRAM Bilateral 04/05/2016   ingrown toenail removed  5-6 yrs ago   toenail removal Left    TOOTH EXTRACTION  10/08/2022   TOTAL HIP ARTHROPLASTY Right 03/09/2013   Procedure: RIGHT TOTAL HIP ARTHROPLASTY ANTERIOR APPROACH;  Surgeon: Shelda Pal, MD;  Location: [facility];  Service: Orthopedics;  Laterality: Right;   Social History   Social History Narrative   Not on file   Immunization History  Administered Date(s) Administered   Influenza Split 12/26/2012   Influenza-Unspecified 11/29/2013, 11/07/2020, 11/13/2021   Moderna Covid-19 Vaccine Bivalent Booster 44yrs & up 06/29/2021, 12/12/2021   Moderna Sars-Covid-2 Vaccination 04/01/2019, 04/27/2019, 10/12/2019, 06/22/2020, 11/07/2020   Pneumococcal Polysaccharide-23 03/11/2013   Rsv, Bivalent, Protein Subunit Rsvpref,pf Verdis Frederickson) 11/13/2021   Zoster Recombinant(Shingrix) 07/08/2017, 12/30/2017     Objective: Vital Signs: BP 123/71 (BP Location: Left Wrist, Patient Position: Sitting, Cuff Size: Normal)   Pulse (!) 59   Resp 16   Ht 5\' 1"  (1.549 m)   Wt 243 lb (110.2 kg)   BMI 45.91 kg/m    Physical Exam Vitals and nursing note reviewed.  Constitutional:      Appearance: She is well-developed.  HENT:     Head: Normocephalic and atraumatic.  Eyes:     Conjunctiva/sclera: Conjunctivae normal.  Cardiovascular:     Rate and Rhythm: Normal rate and regular rhythm.     Heart sounds: Normal heart sounds.  Pulmonary:     Effort: Pulmonary effort is normal.     Breath sounds: Normal breath sounds.  Abdominal:     General: Bowel sounds are normal.     Palpations: Abdomen is soft.  Musculoskeletal:     Cervical back: Normal range of motion.  Lymphadenopathy:     Cervical: No cervical adenopathy.  Skin:    General: Skin is warm and dry.     Capillary Refill: Capillary refill takes less than 2 seconds.  Neurological:     Mental Status: She is alert and oriented to person, place, and time.  Psychiatric:         Behavior: Behavior normal.      Musculoskeletal Exam: Patient remained seated during examination today. Stiffness with ROM of both shoulders.  Elbow joints have good ROM with no tenderness or joint swelling.  Slightly limited extension of both wrist joints.  Some tenderness and limited flexion of the right fifth PIP joint.  Synovitis noted in the left fifth PIP joint.  Both knee joints have good range of motion with no warmth or effusion. Severe pedal edema noted in bilateral lower legs.  CDAI Exam: CDAI Score: 6  Patient Global: 20 / 100; Provider Global: 20 / 100 Swollen: 1 ; Tender: 1  Joint Exam 10/17/2022      Right  Left  PIP 5 (finger)   Tender  Swollen  Investigation: No additional findings.  Imaging: No results found.  Recent Labs: Lab Results  Component Value Date   WBC 7.6 09/17/2022   HGB 13.0 09/17/2022   PLT 281 09/17/2022   NA 137 09/17/2022   K 4.0 09/17/2022   CL 102 09/17/2022   CO2 26 09/17/2022   GLUCOSE 109 (H) 09/17/2022   BUN 28 (H) 09/17/2022   CREATININE 1.45 (H) 09/17/2022   BILITOT 1.5 (H) 09/17/2022   ALKPHOS 46 09/17/2022   AST 29 09/17/2022   ALT 22 09/17/2022   PROT 5.9 (L) 09/17/2022   ALBUMIN 3.6 09/17/2022   CALCIUM 9.3 09/17/2022   GFRAA 48 (L) 10/28/2019   QFTBGOLDPLUS Negative 04/30/2022    Speciality Comments: HCQ-SOB, MTX-SOB, Arava-SOB, Humira-SOB, forgetfullness, Enbrel-Painful injection Actemra-03/2014  Procedures:  No procedures performed Allergies: Allopurinol, Asmanex (120 metered doses) [mometasone furoate], Finasteride, Guaifenesin & derivatives, Methotrexate derivatives, Methylprednisolone, Monosodium glutamate, Plaquenil [hydroxychloroquine sulfate], Prednisone, Prevnar 13 [pneumococcal 13-val conj vacc], Shingrix [zoster vac recomb adjuvanted], Tetanus toxoids, Baclofen, Ciprofloxacin, Epiceram, Humira [adalimumab], Keratin, Leflunomide, Other, Polysporin [bacitracin-polymyxin b], Rosuvastatin, Vitamin e,  Neosporin [neomycin-bacitracin zn-polymyx], and Sulfa antibiotics   Assessment / Plan:     Visit Diagnoses: Rheumatoid arthritis with rheumatoid factor of multiple sites without organ or systems involvement (HCC) - Dx 20 years ago, Followed by Dr. Kathi Ludwig. 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 ESR 18, CRP<1: Patient continues to experience intermittent pain and stiffness in both hands due to underlying rheumatoid arthritis and osteoarthritis overlap.  Mild inflammation was noted in the left fifth PIP joint but no tenderness was apparent.  Overall she has noticed clinical improvement since adding on prednisone 5 mg 1 tablet daily.  She remains on Actemra 8 mg/kg IV infusions every 4 weeks.  Her last infusion was administered on 09/17/2022.  She underwent a tooth extraction on 10/08/2022 and has been cleared to resume Actemra infusions by her dentist--patient plans on scheduling her next infusion during the first week of September 2024.  No medication changes will be made at this time.  She was advised to notify us if she develops signs or symptoms of a flare.  She will follow-up in the office in 3 months or sooner if needed.  Current chronic use of systemic steroids - Prednisone 5 mg 1 tablet by mouth daily.  Patient is aware of the risks of long-term prednisone use.  High risk medication use: Actemra 8 mg/kg IV infusions q4wk (started in 2016). She remains on Prednisone 5 mg 1 tablet by mouth daily.  d/cHCQ-SOB, MTX-SOB, Arava-SOB, Humira-SOB, forgetfullness, Enbrel-Painful inj. CBC and CMP updated on 09/17/22.  She will continue receiving lab work with infusions.  TB gold negative 04/30/22.  Discussed the importance of holding actemra if she develops signs or symptoms of an infection and to resume once the infection has completely cleared.   Elevated serum creatinine: Under care of Dr. Signe Colt.  Creatinine was 1.45 and GFR was 38 on 09/17/2022.  Patient has lab work at least every 3 months with her infusions for close  monitoring.  She has been avoiding use of NSAIDs.  Positive TB test: TB Gold negative on 04/30/2022.   Pain in both hands: She has noticed improvement in the soreness and stiffness in both hands since initiating prednisone 5 mg daily.  She continues to have stiffness in her right fifth digit to the point that she has difficulty making complete fist.  On examination today she has inflammation in the left fifth PIP joint but no tenderness upon palpation.  Discussed  the importance of joint protection and muscle strengthening.  Status post total hip replacement, right: Doing well.  Primary osteoarthritis of both knees: Patient continues to have chronic pain and stiffness in both knee joints.  On examination today she has good range of motion of both knee joints with no warmth or effusion.  Patient uses a cane to assist with ambulation.  Pain in thoracic spine: Thoracic kyphosis noted.  Chronic bilateral low back pain without sciatica: Patient is not experiencing increased discomfort in her lower back at this time.  No symptoms of sciatica currently.  Other medical conditions are listed as follows:  Essential hypertension: Blood pressure was 123/71 today in the office.  History of hyperlipidemia  Stage 3a chronic kidney disease (HCC): Followed by Dr. Signe Colt.  Family history of rheumatoid arthritis  Former smoker  History of anemia  Pulmonary nodules  Orders: No orders of the defined types were placed in this encounter.  No orders of the defined types were placed in this encounter.   Follow-Up Instructions: Return in about 3 months (around 01/17/2023) for Rheumatoid arthritis, Osteoarthritis.   Gearldine Bienenstock, PA-C  Note - This record has been created using Dragon software.  Chart creation errors have been sought, but may not always  have been located. Such creation errors do not reflect on  the standard of medical care.

## 2022-10-08 NOTE — Telephone Encounter (Signed)
Patient advised she should require clearance from dentist prior to proceeding with her next actemra infusion to ensure she has not signs of inflammation and is properly healing.

## 2022-10-08 NOTE — Telephone Encounter (Signed)
She should require clearance from dentist prior to proceeding with her next actemra infusion to ensure she has not signs of inflammation and is properly healing.

## 2022-10-15 ENCOUNTER — Encounter (HOSPITAL_COMMUNITY): Payer: Medicare Other

## 2022-10-17 ENCOUNTER — Encounter: Payer: Self-pay | Admitting: Physician Assistant

## 2022-10-17 ENCOUNTER — Ambulatory Visit: Payer: Medicare Other | Attending: Physician Assistant | Admitting: Physician Assistant

## 2022-10-17 VITALS — BP 123/71 | HR 59 | Resp 16 | Ht 61.0 in | Wt 243.0 lb

## 2022-10-17 DIAGNOSIS — Z79899 Other long term (current) drug therapy: Secondary | ICD-10-CM

## 2022-10-17 DIAGNOSIS — Z7952 Long term (current) use of systemic steroids: Secondary | ICD-10-CM | POA: Diagnosis not present

## 2022-10-17 DIAGNOSIS — M546 Pain in thoracic spine: Secondary | ICD-10-CM | POA: Diagnosis not present

## 2022-10-17 DIAGNOSIS — Z862 Personal history of diseases of the blood and blood-forming organs and certain disorders involving the immune mechanism: Secondary | ICD-10-CM

## 2022-10-17 DIAGNOSIS — I10 Essential (primary) hypertension: Secondary | ICD-10-CM

## 2022-10-17 DIAGNOSIS — R7611 Nonspecific reaction to tuberculin skin test without active tuberculosis: Secondary | ICD-10-CM | POA: Diagnosis not present

## 2022-10-17 DIAGNOSIS — R7989 Other specified abnormal findings of blood chemistry: Secondary | ICD-10-CM

## 2022-10-17 DIAGNOSIS — R918 Other nonspecific abnormal finding of lung field: Secondary | ICD-10-CM

## 2022-10-17 DIAGNOSIS — M79642 Pain in left hand: Secondary | ICD-10-CM | POA: Diagnosis not present

## 2022-10-17 DIAGNOSIS — M170 Bilateral primary osteoarthritis of knee: Secondary | ICD-10-CM | POA: Diagnosis not present

## 2022-10-17 DIAGNOSIS — Z8261 Family history of arthritis: Secondary | ICD-10-CM

## 2022-10-17 DIAGNOSIS — N1831 Chronic kidney disease, stage 3a: Secondary | ICD-10-CM | POA: Diagnosis not present

## 2022-10-17 DIAGNOSIS — Z96641 Presence of right artificial hip joint: Secondary | ICD-10-CM

## 2022-10-17 DIAGNOSIS — Z87891 Personal history of nicotine dependence: Secondary | ICD-10-CM

## 2022-10-17 DIAGNOSIS — Z8639 Personal history of other endocrine, nutritional and metabolic disease: Secondary | ICD-10-CM

## 2022-10-17 DIAGNOSIS — M0579 Rheumatoid arthritis with rheumatoid factor of multiple sites without organ or systems involvement: Secondary | ICD-10-CM | POA: Diagnosis not present

## 2022-10-17 DIAGNOSIS — M79641 Pain in right hand: Secondary | ICD-10-CM | POA: Diagnosis not present

## 2022-10-17 DIAGNOSIS — Z8719 Personal history of other diseases of the digestive system: Secondary | ICD-10-CM

## 2022-10-17 DIAGNOSIS — M545 Low back pain, unspecified: Secondary | ICD-10-CM | POA: Diagnosis not present

## 2022-10-17 DIAGNOSIS — G8929 Other chronic pain: Secondary | ICD-10-CM

## 2022-10-23 DIAGNOSIS — D510 Vitamin B12 deficiency anemia due to intrinsic factor deficiency: Secondary | ICD-10-CM | POA: Diagnosis not present

## 2022-10-31 ENCOUNTER — Ambulatory Visit (HOSPITAL_COMMUNITY)
Admission: RE | Admit: 2022-10-31 | Discharge: 2022-10-31 | Disposition: A | Discharge status: Home / Self Care | Payer: Medicare Other | Source: Ambulatory Visit | Attending: Rheumatology | Admitting: Rheumatology

## 2022-10-31 DIAGNOSIS — Z79899 Other long term (current) drug therapy: Secondary | ICD-10-CM | POA: Insufficient documentation

## 2022-10-31 DIAGNOSIS — M0579 Rheumatoid arthritis with rheumatoid factor of multiple sites without organ or systems involvement: Secondary | ICD-10-CM | POA: Insufficient documentation

## 2022-10-31 LAB — CBC WITH DIFFERENTIAL/PLATELET
Abs Immature Granulocytes: 0.04 10*3/uL (ref 0.00–0.07)
Basophils Absolute: 0 10*3/uL (ref 0.0–0.1)
Basophils Relative: 0 %
Eosinophils Absolute: 0.1 10*3/uL (ref 0.0–0.5)
Eosinophils Relative: 2 %
HCT: 37.1 % (ref 36.0–46.0)
Hemoglobin: 12.3 g/dL (ref 12.0–15.0)
Immature Granulocytes: 0 %
Lymphocytes Relative: 10 %
Lymphs Abs: 0.9 10*3/uL (ref 0.7–4.0)
MCH: 32.5 pg (ref 26.0–34.0)
MCHC: 33.2 g/dL (ref 30.0–36.0)
MCV: 98.1 fL (ref 80.0–100.0)
Monocytes Absolute: 0.5 10*3/uL (ref 0.1–1.0)
Monocytes Relative: 5 %
Neutro Abs: 7.8 10*3/uL — ABNORMAL HIGH (ref 1.7–7.7)
Neutrophils Relative %: 83 %
Platelets: 291 10*3/uL (ref 150–400)
RBC: 3.78 MIL/uL — ABNORMAL LOW (ref 3.87–5.11)
RDW: 12.7 % (ref 11.5–15.5)
WBC: 9.4 10*3/uL (ref 4.0–10.5)
nRBC: 0 % (ref 0.0–0.2)

## 2022-10-31 LAB — COMPREHENSIVE METABOLIC PANEL
ALT: 22 U/L (ref 0–44)
AST: 21 U/L (ref 15–41)
Albumin: 3.6 g/dL (ref 3.5–5.0)
Alkaline Phosphatase: 56 U/L (ref 38–126)
Anion gap: 7 (ref 5–15)
BUN: 23 mg/dL (ref 8–23)
CO2: 25 mmol/L (ref 22–32)
Calcium: 8.9 mg/dL (ref 8.9–10.3)
Chloride: 101 mmol/L (ref 98–111)
Creatinine, Ser: 1.29 mg/dL — ABNORMAL HIGH (ref 0.44–1.00)
GFR, Estimated: 44 mL/min — ABNORMAL LOW (ref >60)
Glucose, Bld: 133 mg/dL — ABNORMAL HIGH (ref 70–99)
Potassium: 4.1 mmol/L (ref 3.5–5.1)
Sodium: 133 mmol/L — ABNORMAL LOW (ref 135–145)
Total Bilirubin: 1.4 mg/dL — ABNORMAL HIGH (ref 0.3–1.2)
Total Protein: 5.8 g/dL — ABNORMAL LOW (ref 6.5–8.1)

## 2022-10-31 MED ORDER — ACETAMINOPHEN 325 MG PO TABS: 650.0000 mg | ORAL_TABLET | ORAL | Status: DC

## 2022-10-31 MED ORDER — DIPHENHYDRAMINE HCL 25 MG PO CAPS: 25.0000 mg | ORAL_CAPSULE | ORAL | Status: DC

## 2022-10-31 MED ORDER — TOCILIZUMAB 400 MG/20ML IV SOLN
8.0000 mg/kg | INTRAVENOUS | Status: DC
  Administered 2022-10-31: 888 mg via INTRAVENOUS
  Filled 2022-10-31: qty 44.4

## 2022-11-03 NOTE — Progress Notes (Signed)
Sodium is low, glucose is mildly elevated, creatinine is elevated and improved.  CBC is stable.  Please forward results to her PCP.

## 2022-11-14 DIAGNOSIS — Z23 Encounter for immunization: Secondary | ICD-10-CM | POA: Diagnosis not present

## 2022-11-21 ENCOUNTER — Other Ambulatory Visit: Payer: Self-pay | Admitting: Rheumatology

## 2022-11-21 NOTE — Telephone Encounter (Signed)
Last Fill: 09/07/2022  Next Visit: 01/20/2023  Last Visit: 10/17/2022  Dx: Rheumatoid arthritis with rheumatoid factor of multiple sites without organ or systems involvement   Current Dose per office note on 10/17/2022:  Prednisone 5 mg 1 tablet by mouth daily.   Okay to refill Prednisone?

## 2022-11-27 ENCOUNTER — Telehealth: Payer: Self-pay

## 2022-11-27 ENCOUNTER — Other Ambulatory Visit: Payer: Self-pay | Admitting: Pharmacist

## 2022-11-27 DIAGNOSIS — Z79899 Other long term (current) drug therapy: Secondary | ICD-10-CM

## 2022-11-27 DIAGNOSIS — M0579 Rheumatoid arthritis with rheumatoid factor of multiple sites without organ or systems involvement: Secondary | ICD-10-CM

## 2022-11-27 NOTE — Progress Notes (Signed)
Next infusion scheduled for Actemra IV (N8295) on 11/28/2022 and due for updated orders. Diagnosis: RA  Dose: 8mg /kg every 4 weeks  Last Clinic Visit: 10/17/22 Next Clinic Visit: 01/20/23  Last infusion: 10/31/2022  Labs: CBC and CMP on 10/31/2022 TB Gold: negative on 04/30/2022   Orders placed for Actemra IV (J3262) x 3 doses along with premedication of acetaminophen and diphenhydramine to be administered 30 minutes before medication infusion.  Standing CBC with diff/platelet and CMP with GFR orders placed to be drawn every 2 months.  Next TB gold due 04/30/2023  Chesley Mires, PharmD, MPH, BCPS, CPP Clinical Pharmacist (Rheumatology and Pulmonology)

## 2022-11-27 NOTE — Patient Outreach (Signed)
Care Coordination   Initial Visit Note   11/27/2022 Name: [name] [name] MRN: [id number] DOB: Feb 27, 1950  [name] [name] is a 72 y.o. year old female who sees Moon, Amy A, NP for primary care. I spoke with  [name] [name] by phone today.  What matters to the patients health and wellness today?  Patient call and left a message inquiring about getting her infusion at the new infusion center to open in November.       SDOH assessments and interventions completed:  No     Care Coordination Interventions:  Yes, provided   Interventions Today    Flowsheet Row Most Recent Value  Chronic Disease   Chronic disease during today's visit Other  [RA]  General Interventions   General Interventions Discussed/Reviewed Communication with  Communication with RN  [name] [name] email to [name] [name] to inquire about getting patient set up in [place] for a infusion for RA.  Awaiting a response. Patient informed.]       Follow up plan:  awaiting an email response and will call patient back and update her.     Encounter Outcome:  Patient Visit Completed   [name] Chimera, RN, BSN, CEN [facility] Coordinator [phone]

## 2022-11-28 ENCOUNTER — Encounter (HOSPITAL_COMMUNITY)
Admission: RE | Admit: 2022-11-28 | Discharge: 2022-11-28 | Disposition: A | Discharge status: Home / Self Care | Payer: Medicare Other | Source: Ambulatory Visit | Attending: Rheumatology | Admitting: Rheumatology

## 2022-11-28 DIAGNOSIS — Z79899 Other long term (current) drug therapy: Secondary | ICD-10-CM

## 2022-11-28 DIAGNOSIS — M0579 Rheumatoid arthritis with rheumatoid factor of multiple sites without organ or systems involvement: Secondary | ICD-10-CM | POA: Diagnosis not present

## 2022-11-28 MED ORDER — TOCILIZUMAB 400 MG/20ML IV SOLN
8.0000 mg/kg | INTRAVENOUS | Status: DC
  Administered 2022-11-28: 882 mg via INTRAVENOUS
  Filled 2022-11-28: qty 40

## 2022-11-28 MED ORDER — DIPHENHYDRAMINE HCL 25 MG PO CAPS: 25.0000 mg | ORAL_CAPSULE | ORAL | Status: DC

## 2022-11-28 MED ORDER — ACETAMINOPHEN 325 MG PO TABS: 650.0000 mg | ORAL_TABLET | ORAL | Status: DC

## 2022-12-04 DIAGNOSIS — D510 Vitamin B12 deficiency anemia due to intrinsic factor deficiency: Secondary | ICD-10-CM | POA: Diagnosis not present

## 2022-12-06 ENCOUNTER — Telehealth: Payer: Self-pay

## 2022-12-06 NOTE — Patient Outreach (Signed)
Care Coordination   Follow Up Visit Note   12/06/2022 Name: Jesslin Herran MRN: [id number] DOB: 1950-10-14  Margueriete Booten is a 72 y.o. year old female who sees Moon, Amy A, NP for primary care. I spoke with  [name] by phone today.  What matters to the patients health and wellness today?  Received an email from [name] who states that she continues to work on get infusion moved to the new [facility]. Placed call to patient to inform.    Follow up plan:  will call patient back when I have further information.     Encounter Outcome:  Patient Visit Completed    Lonia Chimera, RN, BSN, CEN [facility] Coordinator [phone]

## 2022-12-12 DIAGNOSIS — R252 Cramp and spasm: Secondary | ICD-10-CM | POA: Diagnosis not present

## 2022-12-12 DIAGNOSIS — I10 Essential (primary) hypertension: Secondary | ICD-10-CM | POA: Diagnosis not present

## 2022-12-12 DIAGNOSIS — L89890 Pressure ulcer of other site, unstageable: Secondary | ICD-10-CM | POA: Diagnosis not present

## 2022-12-12 DIAGNOSIS — L03119 Cellulitis of unspecified part of limb: Secondary | ICD-10-CM | POA: Diagnosis not present

## 2022-12-12 DIAGNOSIS — R600 Localized edema: Secondary | ICD-10-CM | POA: Diagnosis not present

## 2022-12-13 DIAGNOSIS — S91302A Unspecified open wound, left foot, initial encounter: Secondary | ICD-10-CM | POA: Diagnosis not present

## 2022-12-13 DIAGNOSIS — M7732 Calcaneal spur, left foot: Secondary | ICD-10-CM | POA: Diagnosis not present

## 2022-12-13 DIAGNOSIS — I872 Venous insufficiency (chronic) (peripheral): Secondary | ICD-10-CM | POA: Diagnosis not present

## 2022-12-13 DIAGNOSIS — L89890 Pressure ulcer of other site, unstageable: Secondary | ICD-10-CM | POA: Diagnosis not present

## 2022-12-13 DIAGNOSIS — M7989 Other specified soft tissue disorders: Secondary | ICD-10-CM | POA: Diagnosis not present

## 2022-12-13 DIAGNOSIS — I890 Lymphedema, not elsewhere classified: Secondary | ICD-10-CM | POA: Diagnosis not present

## 2022-12-17 ENCOUNTER — Telehealth: Payer: Self-pay

## 2022-12-17 NOTE — Telephone Encounter (Signed)
Patient contacted the office and inquires how close to her infusion can she get her COVID shot. Please advise.   Patient states she had a left foot injury and she went to the wound center and they told the patient she had a little bit of infection, patient states she is currently on keflex until 12/22/2022. Patient states her next infusion is on 12/26/2022. Patient states she will see the wound center again Friday. Patient inquires if she should get another culture. Patient will tell wound center to fax [place] culture results. Please advise.

## 2022-12-17 NOTE — Telephone Encounter (Signed)
She should follow the recommendations of the wound center.  She would not be able to get the infusions until the infection resolves.  She will need a clearance from the wound center.

## 2022-12-18 NOTE — Telephone Encounter (Signed)
Patient advised She should follow the recommendations of the wound center. She would not be able to get the infusions until the infection resolves. She will need a clearance from the wound center. Patient verbalized understanding.   Patient inquires how close to her infusion can she get her COVID shot. Please advise.

## 2022-12-18 NOTE — Telephone Encounter (Signed)
COVID-19 vaccine recommendations:   COVID-19 vaccine is recommended for everyone (unless you are allergic to a vaccine component), even if you are on a medication that suppresses your immune system.   There are no recommendations to hold COVID-19 vaccine for Actemra infusions.  If you are on Methotrexate, Cellcept (mycophenolate), Rinvoq, Harriette Ohara, and Olumiant- hold the medication for 1 week after each vaccine. Hold Methotrexate for 2 weeks after the single dose COVID-19 vaccine.   If you are on Orencia subcutaneous injection - hold medication one week prior to and one week after the first COVID-19 vaccine dose (only).   If you are on Orencia IV infusions- time vaccination administration so that the first COVID-19 vaccination will occur four weeks after the infusion and postpone the subsequent infusion by one week.   If you are on Cyclophosphamide or Rituxan infusions please contact your doctor prior to receiving the COVID-19 vaccine.   Do not take Tylenol or any anti-inflammatory medications (NSAIDs) 24 hours prior to the COVID-19 vaccination.   There is no direct evidence about the efficacy of the COVID-19 vaccine in individuals who are on medications that suppress the immune system.   Even if you are fully vaccinated, and you are on any medications that suppress your immune system, please continue to wear a mask, maintain at least six feet social distance and practice hand hygiene.   If you develop a COVID-19 infection, please contact your PCP or our office to determine if you need monoclonal antibody infusion.  The booster vaccine is now available for immunocompromised patients.   Please see the following web sites for updated information.   [email or web address]

## 2022-12-18 NOTE — Telephone Encounter (Signed)
Patient advised there are no recommendations to hold COVID-19 vaccine for Actemra infusions.

## 2022-12-20 DIAGNOSIS — L89890 Pressure ulcer of other site, unstageable: Secondary | ICD-10-CM | POA: Diagnosis not present

## 2022-12-20 DIAGNOSIS — I872 Venous insufficiency (chronic) (peripheral): Secondary | ICD-10-CM | POA: Diagnosis not present

## 2022-12-20 DIAGNOSIS — I890 Lymphedema, not elsewhere classified: Secondary | ICD-10-CM | POA: Diagnosis not present

## 2022-12-24 DIAGNOSIS — L89890 Pressure ulcer of other site, unstageable: Secondary | ICD-10-CM | POA: Diagnosis not present

## 2022-12-24 DIAGNOSIS — I890 Lymphedema, not elsewhere classified: Secondary | ICD-10-CM | POA: Diagnosis not present

## 2022-12-24 DIAGNOSIS — I872 Venous insufficiency (chronic) (peripheral): Secondary | ICD-10-CM | POA: Diagnosis not present

## 2022-12-26 ENCOUNTER — Ambulatory Visit (HOSPITAL_COMMUNITY)
Admission: RE | Admit: 2022-12-26 | Discharge: 2022-12-26 | Disposition: A | Discharge status: Home / Self Care | Payer: Medicare Other | Source: Ambulatory Visit | Attending: Rheumatology | Admitting: Rheumatology

## 2022-12-26 DIAGNOSIS — Z79899 Other long term (current) drug therapy: Secondary | ICD-10-CM | POA: Diagnosis not present

## 2022-12-26 DIAGNOSIS — M0579 Rheumatoid arthritis with rheumatoid factor of multiple sites without organ or systems involvement: Secondary | ICD-10-CM | POA: Diagnosis not present

## 2022-12-26 MED ORDER — TOCILIZUMAB 400 MG/20ML IV SOLN
8.0000 mg/kg | INTRAVENOUS | Status: DC
  Administered 2022-12-26: 882 mg via INTRAVENOUS
  Filled 2022-12-26: qty 40

## 2022-12-26 MED ORDER — DIPHENHYDRAMINE HCL 25 MG PO CAPS: 25.0000 mg | ORAL_CAPSULE | ORAL | Status: DC

## 2022-12-26 MED ORDER — ACETAMINOPHEN 325 MG PO TABS: 650.0000 mg | ORAL_TABLET | ORAL | Status: DC

## 2022-12-27 DIAGNOSIS — L89890 Pressure ulcer of other site, unstageable: Secondary | ICD-10-CM | POA: Diagnosis not present

## 2022-12-27 DIAGNOSIS — I872 Venous insufficiency (chronic) (peripheral): Secondary | ICD-10-CM | POA: Diagnosis not present

## 2022-12-27 DIAGNOSIS — I890 Lymphedema, not elsewhere classified: Secondary | ICD-10-CM | POA: Diagnosis not present

## 2022-12-27 DIAGNOSIS — L89894 Pressure ulcer of other site, stage 4: Secondary | ICD-10-CM | POA: Diagnosis not present

## 2022-12-31 DIAGNOSIS — Z0000 Encounter for general adult medical examination without abnormal findings: Secondary | ICD-10-CM | POA: Diagnosis not present

## 2022-12-31 DIAGNOSIS — Z9181 History of falling: Secondary | ICD-10-CM | POA: Diagnosis not present

## 2022-12-31 DIAGNOSIS — M21172 Varus deformity, not elsewhere classified, left ankle: Secondary | ICD-10-CM | POA: Diagnosis not present

## 2023-01-01 DIAGNOSIS — L89890 Pressure ulcer of other site, unstageable: Secondary | ICD-10-CM | POA: Diagnosis not present

## 2023-01-01 DIAGNOSIS — L89894 Pressure ulcer of other site, stage 4: Secondary | ICD-10-CM | POA: Diagnosis not present

## 2023-01-01 DIAGNOSIS — I872 Venous insufficiency (chronic) (peripheral): Secondary | ICD-10-CM | POA: Diagnosis not present

## 2023-01-01 DIAGNOSIS — I890 Lymphedema, not elsewhere classified: Secondary | ICD-10-CM | POA: Diagnosis not present

## 2023-01-02 DIAGNOSIS — M069 Rheumatoid arthritis, unspecified: Secondary | ICD-10-CM | POA: Diagnosis not present

## 2023-01-02 DIAGNOSIS — R600 Localized edema: Secondary | ICD-10-CM | POA: Diagnosis not present

## 2023-01-02 DIAGNOSIS — I872 Venous insufficiency (chronic) (peripheral): Secondary | ICD-10-CM | POA: Diagnosis not present

## 2023-01-02 DIAGNOSIS — M1990 Unspecified osteoarthritis, unspecified site: Secondary | ICD-10-CM | POA: Diagnosis not present

## 2023-01-02 DIAGNOSIS — E785 Hyperlipidemia, unspecified: Secondary | ICD-10-CM | POA: Diagnosis not present

## 2023-01-02 DIAGNOSIS — D510 Vitamin B12 deficiency anemia due to intrinsic factor deficiency: Secondary | ICD-10-CM | POA: Diagnosis not present

## 2023-01-02 DIAGNOSIS — L03119 Cellulitis of unspecified part of limb: Secondary | ICD-10-CM | POA: Diagnosis not present

## 2023-01-02 DIAGNOSIS — L89890 Pressure ulcer of other site, unstageable: Secondary | ICD-10-CM | POA: Diagnosis not present

## 2023-01-06 DIAGNOSIS — I890 Lymphedema, not elsewhere classified: Secondary | ICD-10-CM | POA: Diagnosis not present

## 2023-01-06 DIAGNOSIS — L89890 Pressure ulcer of other site, unstageable: Secondary | ICD-10-CM | POA: Diagnosis not present

## 2023-01-06 DIAGNOSIS — I872 Venous insufficiency (chronic) (peripheral): Secondary | ICD-10-CM | POA: Diagnosis not present

## 2023-01-06 DIAGNOSIS — L89894 Pressure ulcer of other site, stage 4: Secondary | ICD-10-CM | POA: Diagnosis not present

## 2023-01-06 NOTE — Progress Notes (Signed)
Office Visit Note  Patient: [name] [name]             Date of Birth: [birth year]           MRN: [id number]             PCP: Hurshel Party, NP Referring: Hurshel Party, NP Visit Date: 01/20/2023 Occupation: @GUAROCC @  Subjective:  Ulcer on left foot   History of Present Illness: Adama Corea is a 72 y.o. female with history of seropositive rheumatoid arthritis and osteoarthritis.  Patient remains on prednisone 5 mg 1 tablet by mouth daily.  Her last dose of IV Actemra was administered on 11/28/2022.  She is currently under the care of wound care for an ulcer on her left foot.  She currently is using a wound VAC and will be getting lymphedema pumps next week delivered to her home. She is scheduled for her next Actemra infusion next week but has been advised to call to cancel the infusion until the ulcer has healed. She is using a wheelchair currently to avoid any pressure on the ulcer.  She has noticed some increased soreness in both hands and both wrists which she attributes to using the wheelchair.  She has been taking Tylenol as needed for pain relief.    Activities of Daily Living:  Patient reports morning stiffness for 0 minute.   Patient Denies nocturnal pain.  Difficulty dressing/grooming: Denies Difficulty climbing stairs: Reports Difficulty getting out of chair: Reports Difficulty using hands for taps, buttons, cutlery, and/or writing: Reports  Review of Systems  Constitutional:  Negative for fatigue.  HENT:  Negative for mouth sores and mouth dryness.   Eyes:  Positive for dryness.  Respiratory:  Negative for shortness of breath.   Cardiovascular:  Negative for chest pain and palpitations.  Gastrointestinal:  Negative for blood in stool, constipation and diarrhea.  Endocrine: Negative for increased urination.  Genitourinary:  Negative for involuntary urination.  Musculoskeletal:  Positive for joint pain, gait problem, joint pain, myalgias and myalgias. Negative for joint  swelling, muscle weakness, morning stiffness and muscle tenderness.  Skin:  Negative for color change, rash, hair loss and sensitivity to sunlight.  Allergic/Immunologic: Negative for susceptible to infections.  Neurological:  Negative for dizziness and headaches.  Hematological:  Negative for swollen glands.  Psychiatric/Behavioral:  Negative for depressed mood and sleep disturbance. The patient is not nervous/anxious.     PMFS History:  Patient Active Problem List   Diagnosis Date Noted   Primary osteoarthritis of both knees 04/17/2022   History of hyperlipidemia 04/17/2022   Stage 3a chronic kidney disease (HCC) 04/17/2022   Dyspnea 11/05/2013   Rheumatoid arthritis (HCC)    Hypertension    GERD (gastroesophageal reflux disease)    Morbid obesity (HCC) 03/10/2013   S/P right THA, AA 03/09/2013    Past Medical History:  Diagnosis Date   Deviated nasal septum    right side, cannot breathe out of right septum   Episcleritis periodica fugax of both eyes    GERD (gastroesophageal reflux disease)    Hypertension    Kidney disease    Pinguecula of left eye    Plantar fasciitis    Pressure sore    Left foot   Rheumatoid arthritis (HCC)    oa and ra in [facility], [facility]   Spondylitis (HCC)    Stasis dermatitis    UTI (lower urinary tract infection) 03/02/2013   treated with keflex for hip surgery on 03/09/13  Family History  Problem Relation Age of Onset   Hypertension Mother    Alzheimer's disease Mother    Heart disease Father    Kidney disease Father    Heart attack Father    Heart disease Brother    Cancer Brother        agent orange    Heart attack Brother    Vascular Disease Brother    Kidney disease Brother        stage 4   Rheum arthritis Maternal Aunt    Past Surgical History:  Procedure Laterality Date   BIOPSY BREAST Left    [facility]   BREAST BIOPSY Right 11/16/2020   COLON BIOPSY  04/2020   COLONOSCOPY N/A 04/21/2015   DIAGNOSTIC  MAMMOGRAM Bilateral 04/05/2016   ingrown toenail removed  5-6 yrs ago   toenail removal Left    TOOTH EXTRACTION  10/08/2022   TOTAL HIP ARTHROPLASTY Right 03/09/2013   Procedure: RIGHT TOTAL HIP ARTHROPLASTY ANTERIOR APPROACH;  Surgeon: Shelda Pal, MD;  Location: [facility];  Service: Orthopedics;  Laterality: Right;   Social History   Social History Narrative   Not on file   Immunization History  Administered Date(s) Administered   Influenza Split 12/26/2012   Influenza-Unspecified 11/29/2013, 11/07/2020, 11/13/2021, 11/14/2022   Moderna Covid-19 Vaccine Bivalent Booster 83yrs & up 06/29/2021, 12/12/2021   Moderna Sars-Covid-2 Vaccination 04/01/2019, 04/27/2019, 10/12/2019, 06/22/2020, 11/07/2020   Pneumococcal Polysaccharide-23 03/11/2013   Rsv, Bivalent, Protein Subunit Rsvpref,pf Verdis Frederickson) 11/13/2021   Zoster Recombinant(Shingrix) 07/08/2017, 12/30/2017     Objective: Vital Signs: BP 110/72 (BP Location: Left Arm, Patient Position: Sitting, Cuff Size: Normal)   Pulse 68   Resp 14   Ht 5\' 1"  (1.549 m)   Wt 241 lb (109.3 kg) Comment: patient in a wheelchair  BMI 45.54 kg/m    Physical Exam Vitals and nursing note reviewed.  Constitutional:      Appearance: She is well-developed.  HENT:     Head: Normocephalic and atraumatic.  Eyes:     Conjunctiva/sclera: Conjunctivae normal.  Cardiovascular:     Rate and Rhythm: Normal rate and regular rhythm.     Heart sounds: Normal heart sounds.  Pulmonary:     Effort: Pulmonary effort is normal.     Breath sounds: Normal breath sounds.  Abdominal:     General: Bowel sounds are normal.     Palpations: Abdomen is soft.  Musculoskeletal:     Cervical back: Normal range of motion.  Lymphadenopathy:     Cervical: No cervical adenopathy.  Skin:    General: Skin is warm and dry.     Capillary Refill: Capillary refill takes less than 2 seconds.  Neurological:     Mental Status: She is alert and oriented to person, place, and  time.  Psychiatric:        Behavior: Behavior normal.      Musculoskeletal Exam: Patient remained seated in the wheelchair during examination today.  C-spine has good ROM.  Thoracic kyphosis noted.  Shoulder joints have good range of motion.  Elbow joints have good ROM.  Limited extension of both wrists.  Possible mild extensor tenosynovitis of the left wrist.  PIP and DIP thickening.  Hip joints difficult to assess in seated position.  Knee joints have good range of motion with some discomfort.  Severe edema in bilateral lower extremities noted. Left foot is in a postop shoe.   CDAI Exam: CDAI Score: -- Patient Global: --; Provider Global: -- Swollen: --;  Tender: -- Joint Exam 01/20/2023   No joint exam has been documented for this visit   There is currently no information documented on the homunculus. Go to the Rheumatology activity and complete the homunculus joint exam.  Investigation: No additional findings.  Imaging: No results found.  Recent Labs: Lab Results  Component Value Date   WBC 9.4 10/31/2022   HGB 12.3 10/31/2022   PLT 291 10/31/2022   NA 133 (L) 10/31/2022   K 4.1 10/31/2022   CL 101 10/31/2022   CO2 25 10/31/2022   GLUCOSE 133 (H) 10/31/2022   BUN 23 10/31/2022   CREATININE 1.29 (H) 10/31/2022   BILITOT 1.4 (H) 10/31/2022   ALKPHOS 56 10/31/2022   AST 21 10/31/2022   ALT 22 10/31/2022   PROT 5.8 (L) 10/31/2022   ALBUMIN 3.6 10/31/2022   CALCIUM 8.9 10/31/2022   GFRAA 48 (L) 10/28/2019   QFTBGOLDPLUS Negative 04/30/2022    Speciality Comments: HCQ-SOB, MTX-SOB, Arava-SOB, Humira-SOB, forgetfullness, Enbrel-Painful injection Actemra-03/2014  Procedures:  No procedures performed Allergies: Allopurinol, Asmanex (120 metered doses) [mometasone furoate], Finasteride, Guaifenesin & derivatives, Methotrexate derivatives, Methylprednisolone, Monosodium glutamate, Plaquenil [hydroxychloroquine sulfate], Prednisone, Prevnar 13 [pneumococcal 13-val conj  vacc], Shingrix [zoster vac recomb adjuvanted], Tetanus toxoids, Baclofen, Ciprofloxacin, Epiceram, Humira [adalimumab], Keratin, Leflunomide, Other, Polysporin [bacitracin-polymyxin b], Rosuvastatin, Vitamin e, Neosporin [neomycin-bacitracin zn-polymyx], and Sulfa antibiotics    Assessment / Plan:     Visit Diagnoses: Rheumatoid arthritis with rheumatoid factor of multiple sites without organ or systems involvement (HCC) - - Dx 20 years ago, Followed by Dr. Kathi Ludwig. 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 ESR 18, CRP<1: Patient is not currently experiencing any signs or symptoms of a rheumatoid arthritis flare.  She has noticed some increased soreness and stiffness in both hands and both wrist joints which she attributes to having to use a wheelchair over the past month.  She currently is under the care of wound care for pressure ulcer on the left foot.  She is having to use a wound VAC currently and was prescribed IV invanz 11/12-11/18.  She is scheduled for her next Actemra infusion on 01/28/2023 but was advised to call to cancel her infusion.  She will have to hold Actemra until the ulcer has completely healed and she has had clearance by wound care.  She voiced understanding.  She will remain on low-dose prednisone 5 mg 1 tablet by mouth daily and can take Tylenol as needed for pain relief.  She was advised to notify us if she develops signs or symptoms of a flare.  She will follow-up in the office in 3 months or sooner if needed.  High risk medication use: Prednisone 5 mg 1 tablet by mouth daily. Patient is aware of the risks of long-term prednisone use. Currently holding IV Actemra until pressure ulcer on left foot is healed/cleared by wound care.  Her last infusion was on 11/28/2022.   CBC and CMP updated on 10/31/22.  TB gold negative on 04/30/22.  Currently on hold- Actemra 8 mg/kg IV infusions q4wk (started in 2016).  d/cHCQ-SOB, MTX-SOB, Arava-SOB, Humira-SOB, forgetfullness, Enbrel-Painful inj. Discussed the  importance of holding actemra if she develops signs or symptoms of an infection and to resume once the infection has completley cleared.   Current chronic use of systemic steroids - Prednisone 5 mg 1 tablet by mouth daily.  She is aware of the risks of long term prednisone.   Elevated serum creatinine - Under care of Dr. Signe Colt.Creatinine 1.29-improved and GFR low but improved-44 on 10/31/22.  Positive TB test - TB Gold negative on 04/30/2022.  Status post total hip replacement, right: Doing well.  Using a wheelchair currently since she is non-weight bearing due to a pressure ulcer on the left foot.   Primary osteoarthritis of both knees: She has good ROM of both knees with some discomfort and stiffness bilaterally.  No effusion noted.   Pain in thoracic spine: Thoracic kyphosis noted.   Chronic bilateral low back pain without sciatica: No symptoms of sciatica at this time.   Pressure injury of skin of left foot, unspecified injury stage: Under the care of wound care.  Using wound VAC.  Completed IV invanz treatment 11/12-11/18/24.  She is hoping to receive lymphedema pumps to her home this week or next week. Advised to cancel her upcoming Actemra infusion.  She will require clearance from wound care once the ulcer has completely healed to resume Actemra.  She voiced understanding.  Other medical conditions are listed as follows:   Essential hypertension: BP was 110/72 today in the office.    History of hyperlipidemia  Stage 3a chronic kidney disease (HCC): Creatinine was 1.29 and GFR was ow-55 on 10/31/22.    Family history of rheumatoid arthritis  Former smoker  History of anemia  Pulmonary nodules  History of gastroesophageal reflux (GERD)    Orders: No orders of the defined types were placed in this encounter.  No orders of the defined types were placed in this encounter.   Follow-Up Instructions: Return in about 3 months (around 04/22/2023) for Rheumatoid arthritis,  Osteoarthritis.   Gearldine Bienenstock, PA-C  Note - This record has been created using Dragon software.  Chart creation errors have been sought, but may not always  have been located. Such creation errors do not reflect on  the standard of medical care.

## 2023-01-07 DIAGNOSIS — I872 Venous insufficiency (chronic) (peripheral): Secondary | ICD-10-CM | POA: Diagnosis not present

## 2023-01-07 DIAGNOSIS — L89894 Pressure ulcer of other site, stage 4: Secondary | ICD-10-CM | POA: Diagnosis not present

## 2023-01-07 DIAGNOSIS — I890 Lymphedema, not elsewhere classified: Secondary | ICD-10-CM | POA: Diagnosis not present

## 2023-01-08 DIAGNOSIS — I890 Lymphedema, not elsewhere classified: Secondary | ICD-10-CM | POA: Diagnosis not present

## 2023-01-08 DIAGNOSIS — I872 Venous insufficiency (chronic) (peripheral): Secondary | ICD-10-CM | POA: Diagnosis not present

## 2023-01-08 DIAGNOSIS — L89894 Pressure ulcer of other site, stage 4: Secondary | ICD-10-CM | POA: Diagnosis not present

## 2023-01-09 DIAGNOSIS — I890 Lymphedema, not elsewhere classified: Secondary | ICD-10-CM | POA: Diagnosis not present

## 2023-01-09 DIAGNOSIS — L89894 Pressure ulcer of other site, stage 4: Secondary | ICD-10-CM | POA: Diagnosis not present

## 2023-01-09 DIAGNOSIS — I872 Venous insufficiency (chronic) (peripheral): Secondary | ICD-10-CM | POA: Diagnosis not present

## 2023-01-10 DIAGNOSIS — L89894 Pressure ulcer of other site, stage 4: Secondary | ICD-10-CM | POA: Diagnosis not present

## 2023-01-10 DIAGNOSIS — I890 Lymphedema, not elsewhere classified: Secondary | ICD-10-CM | POA: Diagnosis not present

## 2023-01-10 DIAGNOSIS — I872 Venous insufficiency (chronic) (peripheral): Secondary | ICD-10-CM | POA: Diagnosis not present

## 2023-01-11 DIAGNOSIS — I890 Lymphedema, not elsewhere classified: Secondary | ICD-10-CM | POA: Diagnosis not present

## 2023-01-11 DIAGNOSIS — L89894 Pressure ulcer of other site, stage 4: Secondary | ICD-10-CM | POA: Diagnosis not present

## 2023-01-11 DIAGNOSIS — I872 Venous insufficiency (chronic) (peripheral): Secondary | ICD-10-CM | POA: Diagnosis not present

## 2023-01-12 DIAGNOSIS — I890 Lymphedema, not elsewhere classified: Secondary | ICD-10-CM | POA: Diagnosis not present

## 2023-01-12 DIAGNOSIS — I872 Venous insufficiency (chronic) (peripheral): Secondary | ICD-10-CM | POA: Diagnosis not present

## 2023-01-12 DIAGNOSIS — L89894 Pressure ulcer of other site, stage 4: Secondary | ICD-10-CM | POA: Diagnosis not present

## 2023-01-13 DIAGNOSIS — I890 Lymphedema, not elsewhere classified: Secondary | ICD-10-CM | POA: Diagnosis not present

## 2023-01-13 DIAGNOSIS — I872 Venous insufficiency (chronic) (peripheral): Secondary | ICD-10-CM | POA: Diagnosis not present

## 2023-01-13 DIAGNOSIS — L89894 Pressure ulcer of other site, stage 4: Secondary | ICD-10-CM | POA: Diagnosis not present

## 2023-01-13 DIAGNOSIS — L89890 Pressure ulcer of other site, unstageable: Secondary | ICD-10-CM | POA: Diagnosis not present

## 2023-01-20 ENCOUNTER — Encounter: Payer: Self-pay | Admitting: Physician Assistant

## 2023-01-20 ENCOUNTER — Encounter: Payer: Medicare Other | Attending: Rheumatology | Admitting: Physician Assistant

## 2023-01-20 VITALS — BP 110/72 | HR 68 | Resp 14 | Ht 61.0 in | Wt 241.0 lb

## 2023-01-20 DIAGNOSIS — Z87891 Personal history of nicotine dependence: Secondary | ICD-10-CM | POA: Diagnosis not present

## 2023-01-20 DIAGNOSIS — R7611 Nonspecific reaction to tuberculin skin test without active tuberculosis: Secondary | ICD-10-CM | POA: Insufficient documentation

## 2023-01-20 DIAGNOSIS — Z8261 Family history of arthritis: Secondary | ICD-10-CM | POA: Diagnosis not present

## 2023-01-20 DIAGNOSIS — Z8719 Personal history of other diseases of the digestive system: Secondary | ICD-10-CM | POA: Diagnosis not present

## 2023-01-20 DIAGNOSIS — Z96641 Presence of right artificial hip joint: Secondary | ICD-10-CM | POA: Insufficient documentation

## 2023-01-20 DIAGNOSIS — L89894 Pressure ulcer of other site, stage 4: Secondary | ICD-10-CM | POA: Diagnosis not present

## 2023-01-20 DIAGNOSIS — R7989 Other specified abnormal findings of blood chemistry: Secondary | ICD-10-CM | POA: Diagnosis not present

## 2023-01-20 DIAGNOSIS — L89899 Pressure ulcer of other site, unspecified stage: Secondary | ICD-10-CM | POA: Diagnosis not present

## 2023-01-20 DIAGNOSIS — R918 Other nonspecific abnormal finding of lung field: Secondary | ICD-10-CM | POA: Diagnosis not present

## 2023-01-20 DIAGNOSIS — M546 Pain in thoracic spine: Secondary | ICD-10-CM | POA: Diagnosis not present

## 2023-01-20 DIAGNOSIS — Z7952 Long term (current) use of systemic steroids: Secondary | ICD-10-CM | POA: Diagnosis not present

## 2023-01-20 DIAGNOSIS — M170 Bilateral primary osteoarthritis of knee: Secondary | ICD-10-CM | POA: Insufficient documentation

## 2023-01-20 DIAGNOSIS — M0579 Rheumatoid arthritis with rheumatoid factor of multiple sites without organ or systems involvement: Secondary | ICD-10-CM | POA: Diagnosis not present

## 2023-01-20 DIAGNOSIS — I872 Venous insufficiency (chronic) (peripheral): Secondary | ICD-10-CM | POA: Diagnosis not present

## 2023-01-20 DIAGNOSIS — G8929 Other chronic pain: Secondary | ICD-10-CM | POA: Diagnosis not present

## 2023-01-20 DIAGNOSIS — Z862 Personal history of diseases of the blood and blood-forming organs and certain disorders involving the immune mechanism: Secondary | ICD-10-CM | POA: Insufficient documentation

## 2023-01-20 DIAGNOSIS — N1831 Chronic kidney disease, stage 3a: Secondary | ICD-10-CM | POA: Insufficient documentation

## 2023-01-20 DIAGNOSIS — M545 Low back pain, unspecified: Secondary | ICD-10-CM | POA: Diagnosis not present

## 2023-01-20 DIAGNOSIS — Z79899 Other long term (current) drug therapy: Secondary | ICD-10-CM | POA: Insufficient documentation

## 2023-01-20 DIAGNOSIS — Z8639 Personal history of other endocrine, nutritional and metabolic disease: Secondary | ICD-10-CM | POA: Diagnosis not present

## 2023-01-20 DIAGNOSIS — I10 Essential (primary) hypertension: Secondary | ICD-10-CM | POA: Diagnosis not present

## 2023-01-20 DIAGNOSIS — I890 Lymphedema, not elsewhere classified: Secondary | ICD-10-CM | POA: Diagnosis not present

## 2023-01-20 DIAGNOSIS — L89890 Pressure ulcer of other site, unstageable: Secondary | ICD-10-CM | POA: Diagnosis not present

## 2023-01-27 DIAGNOSIS — L89894 Pressure ulcer of other site, stage 4: Secondary | ICD-10-CM | POA: Diagnosis not present

## 2023-01-27 DIAGNOSIS — I890 Lymphedema, not elsewhere classified: Secondary | ICD-10-CM | POA: Diagnosis not present

## 2023-01-27 DIAGNOSIS — L89890 Pressure ulcer of other site, unstageable: Secondary | ICD-10-CM | POA: Diagnosis not present

## 2023-01-28 ENCOUNTER — Encounter (HOSPITAL_COMMUNITY): Payer: Medicare Other

## 2023-02-02 ENCOUNTER — Other Ambulatory Visit: Payer: Self-pay | Admitting: Rheumatology

## 2023-02-03 DIAGNOSIS — I890 Lymphedema, not elsewhere classified: Secondary | ICD-10-CM | POA: Diagnosis not present

## 2023-02-03 DIAGNOSIS — L89894 Pressure ulcer of other site, stage 4: Secondary | ICD-10-CM | POA: Diagnosis not present

## 2023-02-03 DIAGNOSIS — L89890 Pressure ulcer of other site, unstageable: Secondary | ICD-10-CM | POA: Diagnosis not present

## 2023-02-03 NOTE — Telephone Encounter (Signed)
Last Fill: 11/21/2022  Next Visit: 04/23/2023  Last Visit: 01/20/2023  Dx: Rheumatoid arthritis with rheumatoid factor of multiple sites without organ or systems involvement   Current Dose per office note on 01/20/2023:  Prednisone 5 mg 1 tablet by mouth daily.   Okay to refill Prednisone?

## 2023-02-06 DIAGNOSIS — D510 Vitamin B12 deficiency anemia due to intrinsic factor deficiency: Secondary | ICD-10-CM | POA: Diagnosis not present

## 2023-02-11 DIAGNOSIS — L89890 Pressure ulcer of other site, unstageable: Secondary | ICD-10-CM | POA: Diagnosis not present

## 2023-02-11 DIAGNOSIS — I890 Lymphedema, not elsewhere classified: Secondary | ICD-10-CM | POA: Diagnosis not present

## 2023-02-11 DIAGNOSIS — L89894 Pressure ulcer of other site, stage 4: Secondary | ICD-10-CM | POA: Diagnosis not present

## 2023-02-17 DIAGNOSIS — L89894 Pressure ulcer of other site, stage 4: Secondary | ICD-10-CM | POA: Diagnosis not present

## 2023-02-17 DIAGNOSIS — I890 Lymphedema, not elsewhere classified: Secondary | ICD-10-CM | POA: Diagnosis not present

## 2023-02-17 DIAGNOSIS — L89890 Pressure ulcer of other site, unstageable: Secondary | ICD-10-CM | POA: Diagnosis not present

## 2023-02-21 ENCOUNTER — Telehealth: Payer: Self-pay | Admitting: Pharmacist

## 2023-02-21 DIAGNOSIS — I890 Lymphedema, not elsewhere classified: Secondary | ICD-10-CM | POA: Diagnosis not present

## 2023-02-21 DIAGNOSIS — L89894 Pressure ulcer of other site, stage 4: Secondary | ICD-10-CM | POA: Diagnosis not present

## 2023-02-21 DIAGNOSIS — L89890 Pressure ulcer of other site, unstageable: Secondary | ICD-10-CM | POA: Diagnosis not present

## 2023-02-21 NOTE — Telephone Encounter (Signed)
Noted. Nothing further needed in regards to IV Actemra benefits.

## 2023-02-21 NOTE — Telephone Encounter (Signed)
ATC patient to determine if any insurance changes in 2025. Requested return call if any insurance changes.  Medication: IV Actemra  2024 coverage: Medicare A/B + BCBS supplement in 2024.   Medicare covers 80% of the infusion and no authorization is required, and the Plan G supplement would cover the 20% of the cost that was not paid for by Medicare as long as Medicare covered the medication after patient pays her Medicare B deductible. Supplement plan follows Medicare guidelines. The Plan G supplement covers Part A deductible and coinsurance as well as Part B coinsurance after deductible is met. Active as of 12/27/2014. No annual out-of-pocket max.  Chesley Mires, PharmD, MPH, BCPS, CPP Clinical Pharmacist (Rheumatology and Pulmonology)

## 2023-02-21 NOTE — Telephone Encounter (Signed)
Patient contacted the office and states she has no insurance changes.

## 2023-02-28 DIAGNOSIS — I890 Lymphedema, not elsewhere classified: Secondary | ICD-10-CM | POA: Diagnosis not present

## 2023-02-28 DIAGNOSIS — L89894 Pressure ulcer of other site, stage 4: Secondary | ICD-10-CM | POA: Diagnosis not present

## 2023-02-28 DIAGNOSIS — L89890 Pressure ulcer of other site, unstageable: Secondary | ICD-10-CM | POA: Diagnosis not present

## 2023-03-07 DIAGNOSIS — L89890 Pressure ulcer of other site, unstageable: Secondary | ICD-10-CM | POA: Diagnosis not present

## 2023-03-07 DIAGNOSIS — L89894 Pressure ulcer of other site, stage 4: Secondary | ICD-10-CM | POA: Diagnosis not present

## 2023-03-07 DIAGNOSIS — I890 Lymphedema, not elsewhere classified: Secondary | ICD-10-CM | POA: Diagnosis not present

## 2023-03-12 DIAGNOSIS — D510 Vitamin B12 deficiency anemia due to intrinsic factor deficiency: Secondary | ICD-10-CM | POA: Diagnosis not present

## 2023-03-14 DIAGNOSIS — I890 Lymphedema, not elsewhere classified: Secondary | ICD-10-CM | POA: Diagnosis not present

## 2023-03-14 DIAGNOSIS — L89890 Pressure ulcer of other site, unstageable: Secondary | ICD-10-CM | POA: Diagnosis not present

## 2023-03-14 DIAGNOSIS — L89894 Pressure ulcer of other site, stage 4: Secondary | ICD-10-CM | POA: Diagnosis not present

## 2023-03-21 DIAGNOSIS — L89894 Pressure ulcer of other site, stage 4: Secondary | ICD-10-CM | POA: Diagnosis not present

## 2023-03-21 DIAGNOSIS — L89890 Pressure ulcer of other site, unstageable: Secondary | ICD-10-CM | POA: Diagnosis not present

## 2023-03-21 DIAGNOSIS — I890 Lymphedema, not elsewhere classified: Secondary | ICD-10-CM | POA: Diagnosis not present

## 2023-03-28 DIAGNOSIS — L89890 Pressure ulcer of other site, unstageable: Secondary | ICD-10-CM | POA: Diagnosis not present

## 2023-03-28 DIAGNOSIS — I890 Lymphedema, not elsewhere classified: Secondary | ICD-10-CM | POA: Diagnosis not present

## 2023-03-28 DIAGNOSIS — L89894 Pressure ulcer of other site, stage 4: Secondary | ICD-10-CM | POA: Diagnosis not present

## 2023-04-08 DIAGNOSIS — L89894 Pressure ulcer of other site, stage 4: Secondary | ICD-10-CM | POA: Diagnosis not present

## 2023-04-08 DIAGNOSIS — I890 Lymphedema, not elsewhere classified: Secondary | ICD-10-CM | POA: Diagnosis not present

## 2023-04-08 DIAGNOSIS — L89890 Pressure ulcer of other site, unstageable: Secondary | ICD-10-CM | POA: Diagnosis not present

## 2023-04-09 NOTE — Progress Notes (Unsigned)
 Office Visit Note  Patient: [name] [name]             Date of Birth: August 16, 1950           MRN: [id number]             PCP: Hurshel Party, NP Referring: Hurshel Party, NP Visit Date: 04/23/2023 Occupation: @GUAROCC @  Subjective:  Inflammation in both hands   History of Present Illness: [name] [name] is a 73 y.o. female with history of seropositive rheumatoid arthritis and osteoarthritis.  Patient remains on prednisone 5 mg 1 tablet by mouth daily.  Patient reports that 2 to 3 weeks ago she experienced a flare involving her right hand.  Patient states that during the flight she increased prednisone to 10 mg daily for 5 days but has since resumed to 5 mg daily.  She continues to have some residual inflammation in both hands but she has been trying to ice her hands 3 times a day and using Biofreeze topically for pain relief.  Patient has been wheelchair-bound which she feels has contributed to some of the pain she is experiencing in her hands.  Patient states that the wound on her left foot has healed but she will be undergoing left ankle foot reconstruction once she has established care with Dr. Pernell Dupre at [facility].  She would like to discuss resuming actemra prior to surgery since she does not yet have a consultation scheduled with Dr. Pernell Dupre.   Activities of Daily Living:  Patient reports morning stiffness for 0 minute.   Patient Denies nocturnal pain.  Difficulty dressing/grooming: Denies Difficulty climbing stairs: Reports Difficulty getting out of chair: Denies Difficulty using hands for taps, buttons, cutlery, and/or writing: Denies  Review of Systems  Constitutional:  Negative for fatigue.  HENT:  Positive for mouth dryness. Negative for mouth sores.   Eyes:  Positive for dryness.  Respiratory:  Negative for shortness of breath.   Cardiovascular:  Negative for chest pain and palpitations.  Gastrointestinal:  Negative for blood in stool, constipation and diarrhea.  Endocrine: Negative for  increased urination.  Genitourinary:  Negative for involuntary urination.  Musculoskeletal:  Positive for joint pain, gait problem, joint pain, joint swelling and muscle weakness. Negative for myalgias, morning stiffness, muscle tenderness and myalgias.  Skin:  Negative for color change, rash, hair loss and sensitivity to sunlight.  Allergic/Immunologic: Negative for susceptible to infections.  Neurological:  Negative for dizziness and headaches.  Hematological:  Negative for swollen glands.  Psychiatric/Behavioral:  Negative for depressed mood and sleep disturbance. The patient is not nervous/anxious.     PMFS History:  Patient Active Problem List   Diagnosis Date Noted   Primary osteoarthritis of both knees 04/17/2022   History of hyperlipidemia 04/17/2022   Stage 3a chronic kidney disease (HCC) 04/17/2022   Dyspnea 11/05/2013   Rheumatoid arthritis (HCC)    Hypertension    GERD (gastroesophageal reflux disease)    Morbid obesity (HCC) 03/10/2013   S/P right THA, AA 03/09/2013    Past Medical History:  Diagnosis Date   Deviated nasal septum    right side, cannot breathe out of right septum   Episcleritis periodica fugax of both eyes    GERD (gastroesophageal reflux disease)    Hypertension    Kidney disease    Pinguecula of left eye    Plantar fasciitis    Pressure sore    Left foot   Rheumatoid arthritis (HCC)    oa and ra in arms, shane  anderson   Spondylitis (HCC)    Stasis dermatitis    UTI (lower urinary tract infection) 03/02/2013   treated with keflex for hip surgery on 03/09/13    Family History  Problem Relation Age of Onset   Hypertension Mother    Alzheimer's disease Mother    Heart disease Father    Kidney disease Father    Heart attack Father    Heart disease Brother    Cancer Brother        agent orange    Heart attack Brother    Vascular Disease Brother    Kidney disease Brother        stage 4   Rheum arthritis Maternal Aunt    Past Surgical  History:  Procedure Laterality Date   BIOPSY BREAST Left    [facility]   BREAST BIOPSY Right 11/16/2020   COLON BIOPSY  04/2020   COLONOSCOPY N/A 04/21/2015   DIAGNOSTIC MAMMOGRAM Bilateral 04/05/2016   ingrown toenail removed  5-6 yrs ago   toenail removal Left    TOOTH EXTRACTION  10/08/2022   TOTAL HIP ARTHROPLASTY Right 03/09/2013   Procedure: RIGHT TOTAL HIP ARTHROPLASTY ANTERIOR APPROACH;  Surgeon: Shelda Pal, MD;  Location: [facility];  Service: Orthopedics;  Laterality: Right;   Social History   Social History Narrative   Not on file   Immunization History  Administered Date(s) Administered   Influenza Split 12/26/2012   Influenza-Unspecified 11/29/2013, 11/07/2020, 11/13/2021, 11/14/2022   Moderna Covid-19 Vaccine Bivalent Booster 32yrs & up 06/29/2021, 12/12/2021   Moderna Sars-Covid-2 Vaccination 04/01/2019, 04/27/2019, 10/12/2019, 06/22/2020, 11/07/2020   Pneumococcal Polysaccharide-23 03/11/2013   Rsv, Bivalent, Protein Subunit Rsvpref,pf Verdis Frederickson) 11/13/2021   Zoster Recombinant(Shingrix) 07/08/2017, 12/30/2017     Objective: Vital Signs: BP 99/61 (BP Location: Left Arm, Patient Position: Sitting, Cuff Size: Large)   Pulse 60   Resp 16   Ht 5\' 1"  (1.549 m)   Wt 214 lb (97.1 kg)   BMI 40.43 kg/m    Physical Exam Vitals and nursing note reviewed.  Constitutional:      Appearance: She is well-developed.  HENT:     Head: Normocephalic and atraumatic.  Eyes:     Conjunctiva/sclera: Conjunctivae normal.  Cardiovascular:     Rate and Rhythm: Normal rate and regular rhythm.     Heart sounds: Normal heart sounds.  Pulmonary:     Effort: Pulmonary effort is normal.     Breath sounds: Normal breath sounds.  Abdominal:     General: Bowel sounds are normal.     Palpations: Abdomen is soft.  Musculoskeletal:     Cervical back: Normal range of motion.  Lymphadenopathy:     Cervical: No cervical adenopathy.  Skin:    General: Skin is warm and dry.      Capillary Refill: Capillary refill takes less than 2 seconds.  Neurological:     Mental Status: She is alert and oriented to person, place, and time.  Psychiatric:        Behavior: Behavior normal.      Musculoskeletal Exam: Patient remained on wheelchair during the exam.  C-spine has limited range of motion. Thoracic kyphosis noted.  Shoulder joints have limited abduction to about 90 degrees bilaterally.  Tenderness and inflammation noted in her right wrist.  Today extension of both wrist.  PIP and DIP thickening.  Tenderness and synovitis of the right second and third MCP and PIP joints.  Incomplete fist formation bilaterally. Significant lymphedema noted in bilateral lower extremities.  Chronic left ankle varus deformity.   CDAI Exam: CDAI Score: -- Patient Global: --; Provider Global: -- Swollen: --; Tender: -- Joint Exam 04/23/2023   No joint exam has been documented for this visit   There is currently no information documented on the homunculus. Go to the Rheumatology activity and complete the homunculus joint exam.  Investigation: No additional findings.  Imaging: DG Foot Complete Left Result Date: 04/15/2023 Please see detailed radiograph report in office note.  DG Ankle Complete Left Result Date: 04/15/2023 Please see detailed radiograph report in office note.   Recent Labs: Lab Results  Component Value Date   WBC 9.4 10/31/2022   HGB 12.3 10/31/2022   PLT 291 10/31/2022   NA 133 (L) 10/31/2022   K 4.1 10/31/2022   CL 101 10/31/2022   CO2 25 10/31/2022   GLUCOSE 133 (H) 10/31/2022   BUN 23 10/31/2022   CREATININE 1.29 (H) 10/31/2022   BILITOT 1.4 (H) 10/31/2022   ALKPHOS 56 10/31/2022   AST 21 10/31/2022   ALT 22 10/31/2022   PROT 5.8 (L) 10/31/2022   ALBUMIN 3.6 10/31/2022   CALCIUM 8.9 10/31/2022   GFRAA 48 (L) 10/28/2019   QFTBGOLDPLUS Negative 04/30/2022    Speciality Comments: HCQ-SOB, MTX-SOB, Arava-SOB, Humira-SOB, forgetfullness,  Enbrel-Painful injection Actemra-03/2014  Procedures:  No procedures performed Allergies: Allopurinol, Asmanex (120 metered doses) [mometasone furoate], Finasteride, Guaifenesin & derivatives, Methotrexate derivatives, Methylprednisolone, Monosodium glutamate, Plaquenil [hydroxychloroquine sulfate], Prednisone, Prevnar 13 [pneumococcal 13-val conj vacc], Shingrix [zoster vac recomb adjuvanted], Tetanus toxoids, Baclofen, Ciprofloxacin, Epiceram, Humira [adalimumab], Keratin, Leflunomide, Other, Polysporin [bacitracin-polymyxin b], Rosuvastatin, Vitamin e, Neosporin [neomycin-bacitracin zn-polymyx], and Sulfa antibiotics      Assessment / Plan:     Visit Diagnoses: Rheumatoid arthritis with rheumatoid factor of multiple sites without organ or systems involvement (HCC) - Dx 20 years ago, Followed by Dr. Kathi Ludwig. 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 ESR 18, CRP<1: Patient recently had a flare involving her right hand 2 to 3 weeks ago.  She was experiencing significant pain and swelling in the right hand.  She was unable to make a complete fist and has some residual inflammation and post-inflammatory skin peeling noted.   She increased prednisone to 10 mg daily for 5 days which helped to alleviate her symptoms and has since returned to taking prednisone 5 mg daily.  She has been holding IV Actemra (last dose 11/28/22) while under the care of the wound clinic for a pressure ulcer on her left foot.  Her last office visit with wound care was on 03/07/2023-according to the patient the wound has completely healed.  She would like to resume Actemra if possible.  Previously diagnosed with charcot's joint, left ankle and foot--Evaluated by Dr. Jamse Arn 04/15/23.  Patient has been referred to Dr. Pernell Dupre at [facility] to discuss surgical intervention.  Patient has not yet been scheduled for consultation but is aware that she will need to hold Actemra prior to surgery and will need clearance by Dr. Pernell Dupre during the postoperative period prior to  resuming Actemra. If we have clearance from wound care we discussed that she can resume Actemra with close lab monitoring and with the intention of having to hold it again prior to surgery-she voiced understanding. Plan to call to obtain clearance letter from wound clinic-once received orders for actemra can be placed. She will follow up in 3 months or sooner if needed.  High risk medication use - Prednisone 5 mg 1 tablet by mouth daily.  Holding IV Actemra-pressure ulcer on left foot  is healed per patient. Her last infusion was on 11/28/2022. CBC and CMP updated on 04/10/23--creatinine 1.26 and GFR 45, AST and ALT within normal limits, white blood cell count 13.4, absolute neutrophils 11.3, red blood cell count 3.29, hemoglobin 10.1, hematocrit 30, platelet count 575K.  She will continue to require updated lab work every 3 months. Lipid panel updated 04/10/23.  Hgb A1C 04/10/23. TB gold negative on 04/30/22.  Future due for TB gold will be placed today.  - Plan: QuantiFERON-TB Gold Plus  Screening for tuberculosis -Order for TB gold placed today plan: QuantiFERON-TB Gold Plus  Current chronic use of systemic steroids - Prednisone 5 mg 1 tablet by mouth daily. She is aware of the risks of chronic prednisone use.   Elevated serum creatinine - Under care of Dr. Signe Colt.  Creatinine was 1.26 and GFR was 45 on 04/10/2023.  Positive TB test - TB Gold negative on 04/30/2022. Future order for TB gold placed today.   Status post total hip replacement, right: Doing well.    Primary osteoarthritis of both knees: No effusion noted. Patient is primarily wheelchair bound.   Pain in left ankle and joints of left foot: Charcot's joint, left ankle and foot--Evaluated by Dr. Jamse Arn 04/15/23.  Patient has been referred to Dr. Pernell Dupre at [facility] to discuss surgical intervention.  Patient has not yet been scheduled for consultation but is aware that she will need to hold Actemra prior to surgery and will need clearance by Dr. Pernell Dupre  during the postoperative period prior to resuming Actemra.  Pain in thoracic spine: Thoracic kyphosis noted.  Chronic bilateral low back pain without sciatica: Limited mobility.  Patient is primarily wheelchair-bound at this time.   Other medical conditions are listed as follows:  Essential hypertension: Blood pressure was 99/61 today in the office.  History of hyperlipidemia  Stage 3a chronic kidney disease (HCC)  Family history of rheumatoid arthritis  Former smoker  History of anemia  Pulmonary nodules  History of gastroesophageal reflux (GERD)    Orders: Orders Placed This Encounter  Procedures   QuantiFERON-TB Gold Plus   No orders of the defined types were placed in this encounter.     Follow-Up Instructions: Return in about 3 months (around 07/21/2023) for Rheumatoid arthritis.   Gearldine Bienenstock, PA-C  Note - This record has been created using Dragon software.  Chart creation errors have been sought, but may not always  have been located. Such creation errors do not reflect on  the standard of medical care.

## 2023-04-10 DIAGNOSIS — Z79899 Other long term (current) drug therapy: Secondary | ICD-10-CM | POA: Diagnosis not present

## 2023-04-10 DIAGNOSIS — I872 Venous insufficiency (chronic) (peripheral): Secondary | ICD-10-CM | POA: Diagnosis not present

## 2023-04-10 DIAGNOSIS — I10 Essential (primary) hypertension: Secondary | ICD-10-CM | POA: Diagnosis not present

## 2023-04-10 DIAGNOSIS — D510 Vitamin B12 deficiency anemia due to intrinsic factor deficiency: Secondary | ICD-10-CM | POA: Diagnosis not present

## 2023-04-10 DIAGNOSIS — M069 Rheumatoid arthritis, unspecified: Secondary | ICD-10-CM | POA: Diagnosis not present

## 2023-04-10 DIAGNOSIS — R600 Localized edema: Secondary | ICD-10-CM | POA: Diagnosis not present

## 2023-04-10 DIAGNOSIS — L89890 Pressure ulcer of other site, unstageable: Secondary | ICD-10-CM | POA: Diagnosis not present

## 2023-04-10 DIAGNOSIS — L03119 Cellulitis of unspecified part of limb: Secondary | ICD-10-CM | POA: Diagnosis not present

## 2023-04-10 DIAGNOSIS — M1990 Unspecified osteoarthritis, unspecified site: Secondary | ICD-10-CM | POA: Diagnosis not present

## 2023-04-10 DIAGNOSIS — E785 Hyperlipidemia, unspecified: Secondary | ICD-10-CM | POA: Diagnosis not present

## 2023-04-15 ENCOUNTER — Other Ambulatory Visit: Payer: Self-pay | Admitting: Allergy and Immunology

## 2023-04-15 ENCOUNTER — Other Ambulatory Visit: Payer: Self-pay | Admitting: Rheumatology

## 2023-04-15 ENCOUNTER — Encounter: Payer: Self-pay | Admitting: Podiatry

## 2023-04-15 ENCOUNTER — Ambulatory Visit (INDEPENDENT_AMBULATORY_CARE_PROVIDER_SITE_OTHER): Payer: Medicare Other

## 2023-04-15 ENCOUNTER — Ambulatory Visit (INDEPENDENT_AMBULATORY_CARE_PROVIDER_SITE_OTHER): Payer: Medicare Other | Admitting: Podiatry

## 2023-04-15 DIAGNOSIS — M14672 Charcot's joint, left ankle and foot: Secondary | ICD-10-CM

## 2023-04-15 DIAGNOSIS — M778 Other enthesopathies, not elsewhere classified: Secondary | ICD-10-CM | POA: Diagnosis not present

## 2023-04-15 DIAGNOSIS — G629 Polyneuropathy, unspecified: Secondary | ICD-10-CM

## 2023-04-15 NOTE — Telephone Encounter (Signed)
 Last Fill: 02/03/2023   Next Visit: 04/23/2023   Last Visit: 01/20/2023   Dx: Rheumatoid arthritis with rheumatoid factor of multiple sites without organ or systems involvement    Current Dose per office note on 01/20/2023:  Prednisone 5 mg 1 tablet by mouth daily.    Okay to refill Prednisone?

## 2023-04-15 NOTE — Progress Notes (Unsigned)
 Chief Complaint  Patient presents with   Foot Injury    She injured her ankle 2 years ago. She was told that she had stretched her ligament. She is wanting to get it corredcted as she can now not walk. Her foot is turned vertical. No pain currently. Not diabetic, no anticoag.     HPI: 73 y.o. female presents today with chronic left foot and ankle deformity.  States that she injured it 2 years ago and was told that she had ligament damage however the she developed progressive deformity.  She is unable to ambulate with her leg in this condition. It is painful.  Past medical history notable for stage IIIa kidney disease, high blood pressure, midfoot arthritis, osteoarthritis, morbid obesity, lymphedema.  Due to pressure on the outside of her ankle which is when she weightbears with transfer, she has chronic wound which she sees a wound care specialist for. Nearly healed per patient. She would like to discuss options for surgical treatment.  Past Medical History:  Diagnosis Date   Deviated nasal septum    right side, cannot breathe out of right septum   Episcleritis periodica fugax of both eyes    GERD (gastroesophageal reflux disease)    Hypertension    Kidney disease    Pinguecula of left eye    Plantar fasciitis    Pressure sore    Left foot   Rheumatoid arthritis (HCC)    oa and ra in arms, shane anderson   Spondylitis (HCC)    Stasis dermatitis    UTI (lower urinary tract infection) 03/02/2013   treated with keflex for hip surgery on 03/09/13    Past Surgical History:  Procedure Laterality Date   BIOPSY BREAST Left    [facility]   BREAST BIOPSY Right 11/16/2020   COLON BIOPSY  04/2020   COLONOSCOPY N/A 04/21/2015   DIAGNOSTIC MAMMOGRAM Bilateral 04/05/2016   ingrown toenail removed  5-6 yrs ago   toenail removal Left    TOOTH EXTRACTION  10/08/2022   TOTAL HIP ARTHROPLASTY Right 03/09/2013   Procedure: RIGHT TOTAL HIP ARTHROPLASTY ANTERIOR APPROACH;  Surgeon:  Shelda Pal, MD;  Location: [facility];  Service: Orthopedics;  Laterality: Right;    Allergies  Allergen Reactions   Allopurinol Shortness Of Breath   Asmanex (120 Metered Doses) [Mometasone Furoate] Shortness Of Breath   Finasteride Shortness Of Breath   Guaifenesin & Derivatives Shortness Of Breath    MUCINEX   Methotrexate Derivatives Shortness Of Breath   Methylprednisolone Shortness Of Breath   Monosodium Glutamate Shortness Of Breath   Plaquenil [Hydroxychloroquine Sulfate] Shortness Of Breath   Prednisone Shortness Of Breath   Prevnar 13 [Pneumococcal 13-Val Conj Vacc] Shortness Of Breath   Shingrix [Zoster Vac Recomb Adjuvanted] Shortness Of Breath   Tetanus Toxoids Shortness Of Breath   Baclofen     Confusion, muscle weakness, forgetfulness, hallucinations, loss of appetite, headaches    Ciprofloxacin Other (See Comments)    Breathing and swallowing after two doses of medications   Epiceram    Humira [Adalimumab]     SOB. Tolerates Actemra   Keratin     Keratin oil   Leflunomide     SOB   Other     AQUAPHOR   Polysporin [Bacitracin-Polymyxin B]    Rosuvastatin    Vitamin E     Vitamin e oil   Neosporin [Neomycin-Bacitracin Zn-Polymyx] Rash   Sulfa Antibiotics Itching and Rash    ROS  denies any nausea, vomiting, fever, chills, chest pain, shortness of breath   Physical Exam: There were no vitals filed for this visit.  General: The patient is alert and oriented x3 in no acute distress.  Dermatology: Skin is warm, dry bilateral lower extremities.  Skin changes to pretibial region consistent with lymphedema noted.  Stable superficial eschar present to the left lateral ankle about 1 x 1.2 cm in diameter  Vascular: Unable to palpate pedal pulses due to edema.  Foot appears warm and well-perfused.  +2 to +3 pitting edema present.  Lymphedema changes.  Cap refill intact to the digits.  Pedal hair growth diminished.  Neurological: Vibratory sensation absent.   Protective sensation diminished.  Musculoskeletal Exam: Chronic ankle deformity noted with the foot inverted on the ankle.  Does have some mild mobility here but the deformity is nonreducible.  Foot is not plantigrade.  Hammertoe contractures noted.  Significant limitations to any active range of motion.  Radiographic Exam: Left foot and ankle 3 views Examination limited by patient positioning.  Severe diffuse osteopenia noted to the left foot.  Chronic varus dislocation of the foot and ankle.  Weightbearing surface is essentially the lateral subtalar joint and lateral ankle.  Increased soft tissue envelope diffusely.  Assessment/Plan of Care: 1. Charcot's joint, left ankle and foot   2. Neuropathy      No orders of the defined types were placed in this encounter.  None  Discussed clinical findings with patient today.  Radiographs reviewed with patient.  Lengthy discussion with patient regarding the nature of her deformity and available options for deformity correction and limb salvage.  Left foot and ankle unfortunately is not braceable.  Discussed that she would likely need TTC fusion with other associated procedures to correct her deformity and that this would require extensive time nonweightbearing. Complicated by patients overall health and skin quality. Did discuss that amputation is a viable option as well. Patient is not ready to consider amputation at this time.  Obtaining arterial ultrasound left foot and ankle. Will defer advanced imaging for surgical planning. Will see if any of my partners would be willing to see her for consultation otherwise she may need referral to a lower extremity reconstruction specialist.   Jaqueline Uber L. Marchia Bond, AACFAS [facility]     [address] [place], [place] [address]                Office [phone]  Fax [phone]

## 2023-04-17 ENCOUNTER — Telehealth: Payer: Self-pay

## 2023-04-17 NOTE — Telephone Encounter (Signed)
-----   Message from Barbaraann Share sent at 04/17/2023 12:13 PM EST ----- I am sending a referral to Dr. Alleen Borne of [facility] for this patient for Charcot ankle reconstruction/limb salvage. If this patient could be notified of this that would be great. We spoke about this some during her appointment. If she needs somewhere closer, she may want to try [facility]. I can also see if anyone in Dr. Eliezer Champagne group with [facility] would want to take something like this on if that is significantly more accessible for the patient.

## 2023-04-17 NOTE — Telephone Encounter (Signed)
 Referral, office note and demographics faxed to Dr. Alleen Borne at [facility] [phone], fax [phone]  MyChart message sent to patient to notify

## 2023-04-17 NOTE — Addendum Note (Signed)
 Addended by: Barbaraann Share on: 04/17/2023 12:16 PM   Modules accepted: Orders

## 2023-04-23 ENCOUNTER — Ambulatory Visit: Payer: Medicare Other | Attending: Physician Assistant | Admitting: Physician Assistant

## 2023-04-23 ENCOUNTER — Telehealth: Payer: Self-pay

## 2023-04-23 ENCOUNTER — Encounter: Payer: Self-pay | Admitting: Physician Assistant

## 2023-04-23 VITALS — BP 99/61 | HR 60 | Resp 16 | Ht 61.0 in | Wt 214.0 lb

## 2023-04-23 DIAGNOSIS — Z8639 Personal history of other endocrine, nutritional and metabolic disease: Secondary | ICD-10-CM

## 2023-04-23 DIAGNOSIS — M0579 Rheumatoid arthritis with rheumatoid factor of multiple sites without organ or systems involvement: Secondary | ICD-10-CM | POA: Diagnosis not present

## 2023-04-23 DIAGNOSIS — Z79899 Other long term (current) drug therapy: Secondary | ICD-10-CM | POA: Diagnosis not present

## 2023-04-23 DIAGNOSIS — Z862 Personal history of diseases of the blood and blood-forming organs and certain disorders involving the immune mechanism: Secondary | ICD-10-CM | POA: Diagnosis not present

## 2023-04-23 DIAGNOSIS — Z87891 Personal history of nicotine dependence: Secondary | ICD-10-CM | POA: Diagnosis not present

## 2023-04-23 DIAGNOSIS — Z96641 Presence of right artificial hip joint: Secondary | ICD-10-CM | POA: Diagnosis not present

## 2023-04-23 DIAGNOSIS — M545 Low back pain, unspecified: Secondary | ICD-10-CM | POA: Diagnosis not present

## 2023-04-23 DIAGNOSIS — Z8261 Family history of arthritis: Secondary | ICD-10-CM | POA: Diagnosis not present

## 2023-04-23 DIAGNOSIS — M25572 Pain in left ankle and joints of left foot: Secondary | ICD-10-CM | POA: Diagnosis not present

## 2023-04-23 DIAGNOSIS — R7989 Other specified abnormal findings of blood chemistry: Secondary | ICD-10-CM

## 2023-04-23 DIAGNOSIS — N1831 Chronic kidney disease, stage 3a: Secondary | ICD-10-CM

## 2023-04-23 DIAGNOSIS — Z7952 Long term (current) use of systemic steroids: Secondary | ICD-10-CM | POA: Diagnosis not present

## 2023-04-23 DIAGNOSIS — Z111 Encounter for screening for respiratory tuberculosis: Secondary | ICD-10-CM | POA: Diagnosis not present

## 2023-04-23 DIAGNOSIS — M170 Bilateral primary osteoarthritis of knee: Secondary | ICD-10-CM

## 2023-04-23 DIAGNOSIS — R7611 Nonspecific reaction to tuberculin skin test without active tuberculosis: Secondary | ICD-10-CM

## 2023-04-23 DIAGNOSIS — I10 Essential (primary) hypertension: Secondary | ICD-10-CM | POA: Diagnosis not present

## 2023-04-23 DIAGNOSIS — Z8719 Personal history of other diseases of the digestive system: Secondary | ICD-10-CM | POA: Diagnosis not present

## 2023-04-23 DIAGNOSIS — M546 Pain in thoracic spine: Secondary | ICD-10-CM | POA: Diagnosis not present

## 2023-04-23 DIAGNOSIS — R918 Other nonspecific abnormal finding of lung field: Secondary | ICD-10-CM | POA: Diagnosis not present

## 2023-04-23 DIAGNOSIS — G8929 Other chronic pain: Secondary | ICD-10-CM

## 2023-04-23 NOTE — Telephone Encounter (Signed)
 I called [facility] to obtain clearance for patient to resume Actemra infusions.  I had to leave a message and advised of fax number for them to fax clearance to and advised they call with any questions.   Will need to follow back up on this as patient needs to restart medication.

## 2023-04-24 NOTE — Telephone Encounter (Signed)
 Received return call from the [facility]. Nurse who returned call states they would no release any records or give clearance as we are not the referring office. She advised patient has been discharge from them and we would need to reach out to PCP.  Contacted PCP's office and left message for [name] Alken, NP to obtain clearance for patient to restart Actemra infusions.

## 2023-04-25 NOTE — Telephone Encounter (Signed)
 Spoke with Rolly Salter from [employer], NP's office. Rolly Salter states Amy has cleared patient to restart Actemra infusion. Patient no longer has open wounds.

## 2023-04-25 NOTE — Telephone Encounter (Signed)
 The patient called back to report that this morning she noticed a dime sized open wound on her left foot.  Patient states that this wound is new and the previous pressure ulcer remains healed.  Patient states that she has been wearing diabetic socks and is worried that the pressure and compression caused the skin to peel open.  There is no drainage or sign of infection at this time.  Patient states that the skin is pink.  She has applied Betadine and antibiotic cream.  Patient will be reaching out to her PCP.  Plan to hold off on resuming IV Actemra until the wound has completely healed.  She voiced understanding.

## 2023-04-25 NOTE — Telephone Encounter (Signed)
 Spoke with patient and advised that [facility] will not send clearance or records since we are not the referring office and she has been discharged from their care. Advised patient that we have reached out to her PCP as well. I advised patient to call [facility] and request that they fax [place] a clearance. Provided patient with fax number. Patient will call.

## 2023-05-02 ENCOUNTER — Telehealth: Payer: Self-pay | Admitting: *Deleted

## 2023-05-02 ENCOUNTER — Encounter: Payer: Self-pay | Admitting: *Deleted

## 2023-05-02 NOTE — Telephone Encounter (Signed)
 Ok to increase prednisone temporarily-- she can take prednisone 10 mg daily x4 days, 7.5 mg daily x4 days, and then resume prednisone 5 mg daily.

## 2023-05-02 NOTE — Telephone Encounter (Signed)
 Patient contacted the office stating she is having pain in her hands. Patient states the pain is getting worse. Patient states she is icing them 3 times a day. Patient would like to know if she can use Voltaren Gel or do you have any other recommendations. Patient states the edema blister on her heel has scabbed over. Please advise.

## 2023-05-02 NOTE — Telephone Encounter (Signed)
 Patient advised per Junie [name] to increase prednisone temporarily-- she can take prednisone 10 mg daily x4 days, 7.5 mg daily x4 days, and then resume prednisone 5 mg daily.

## 2023-05-08 ENCOUNTER — Telehealth: Payer: Self-pay | Admitting: Podiatry

## 2023-05-08 NOTE — Telephone Encounter (Signed)
 As per patient referral is needed. Fax: [phone]/Dr. Alleen Borne at [facility] is requesting Images. Patient contact number, [phone]

## 2023-05-14 DIAGNOSIS — N183 Chronic kidney disease, stage 3 unspecified: Secondary | ICD-10-CM | POA: Diagnosis not present

## 2023-05-14 DIAGNOSIS — M059 Rheumatoid arthritis with rheumatoid factor, unspecified: Secondary | ICD-10-CM | POA: Diagnosis not present

## 2023-05-14 DIAGNOSIS — D631 Anemia in chronic kidney disease: Secondary | ICD-10-CM | POA: Diagnosis not present

## 2023-05-14 DIAGNOSIS — I129 Hypertensive chronic kidney disease with stage 1 through stage 4 chronic kidney disease, or unspecified chronic kidney disease: Secondary | ICD-10-CM | POA: Diagnosis not present

## 2023-05-14 DIAGNOSIS — L8990 Pressure ulcer of unspecified site, unspecified stage: Secondary | ICD-10-CM | POA: Diagnosis not present

## 2023-05-15 DIAGNOSIS — N1831 Chronic kidney disease, stage 3a: Secondary | ICD-10-CM | POA: Diagnosis not present

## 2023-05-15 DIAGNOSIS — D509 Iron deficiency anemia, unspecified: Secondary | ICD-10-CM | POA: Diagnosis not present

## 2023-05-15 DIAGNOSIS — D510 Vitamin B12 deficiency anemia due to intrinsic factor deficiency: Secondary | ICD-10-CM | POA: Diagnosis not present

## 2023-05-15 DIAGNOSIS — S91302A Unspecified open wound, left foot, initial encounter: Secondary | ICD-10-CM | POA: Diagnosis not present

## 2023-05-23 DIAGNOSIS — S91302A Unspecified open wound, left foot, initial encounter: Secondary | ICD-10-CM | POA: Diagnosis not present

## 2023-05-23 DIAGNOSIS — L89623 Pressure ulcer of left heel, stage 3: Secondary | ICD-10-CM | POA: Diagnosis not present

## 2023-05-23 DIAGNOSIS — I890 Lymphedema, not elsewhere classified: Secondary | ICD-10-CM | POA: Diagnosis not present

## 2023-05-26 DIAGNOSIS — N189 Chronic kidney disease, unspecified: Secondary | ICD-10-CM | POA: Diagnosis not present

## 2023-05-26 DIAGNOSIS — D631 Anemia in chronic kidney disease: Secondary | ICD-10-CM | POA: Diagnosis not present

## 2023-05-30 DIAGNOSIS — S91302A Unspecified open wound, left foot, initial encounter: Secondary | ICD-10-CM | POA: Diagnosis not present

## 2023-05-30 DIAGNOSIS — L89623 Pressure ulcer of left heel, stage 3: Secondary | ICD-10-CM | POA: Diagnosis not present

## 2023-05-30 DIAGNOSIS — I890 Lymphedema, not elsewhere classified: Secondary | ICD-10-CM | POA: Diagnosis not present

## 2023-05-31 ENCOUNTER — Other Ambulatory Visit: Payer: Self-pay | Admitting: Allergy and Immunology

## 2023-06-02 DIAGNOSIS — D631 Anemia in chronic kidney disease: Secondary | ICD-10-CM | POA: Diagnosis not present

## 2023-06-02 DIAGNOSIS — N189 Chronic kidney disease, unspecified: Secondary | ICD-10-CM | POA: Diagnosis not present

## 2023-06-02 NOTE — Telephone Encounter (Signed)
 Refill for Ventolin HFA denied at [facility]. Refill is too soon.

## 2023-06-06 DIAGNOSIS — I890 Lymphedema, not elsewhere classified: Secondary | ICD-10-CM | POA: Diagnosis not present

## 2023-06-06 DIAGNOSIS — S91302A Unspecified open wound, left foot, initial encounter: Secondary | ICD-10-CM | POA: Diagnosis not present

## 2023-06-06 DIAGNOSIS — L89623 Pressure ulcer of left heel, stage 3: Secondary | ICD-10-CM | POA: Diagnosis not present

## 2023-06-10 DIAGNOSIS — S91302S Unspecified open wound, left foot, sequela: Secondary | ICD-10-CM | POA: Diagnosis not present

## 2023-06-10 DIAGNOSIS — L89623 Pressure ulcer of left heel, stage 3: Secondary | ICD-10-CM | POA: Diagnosis not present

## 2023-06-10 DIAGNOSIS — I890 Lymphedema, not elsewhere classified: Secondary | ICD-10-CM | POA: Diagnosis not present

## 2023-06-17 DIAGNOSIS — S91302A Unspecified open wound, left foot, initial encounter: Secondary | ICD-10-CM | POA: Diagnosis not present

## 2023-06-17 DIAGNOSIS — L89623 Pressure ulcer of left heel, stage 3: Secondary | ICD-10-CM | POA: Diagnosis not present

## 2023-06-17 DIAGNOSIS — I890 Lymphedema, not elsewhere classified: Secondary | ICD-10-CM | POA: Diagnosis not present

## 2023-06-24 DIAGNOSIS — I890 Lymphedema, not elsewhere classified: Secondary | ICD-10-CM | POA: Diagnosis not present

## 2023-06-24 DIAGNOSIS — L89623 Pressure ulcer of left heel, stage 3: Secondary | ICD-10-CM | POA: Diagnosis not present

## 2023-06-24 DIAGNOSIS — S91302A Unspecified open wound, left foot, initial encounter: Secondary | ICD-10-CM | POA: Diagnosis not present

## 2023-06-25 DIAGNOSIS — D510 Vitamin B12 deficiency anemia due to intrinsic factor deficiency: Secondary | ICD-10-CM | POA: Diagnosis not present

## 2023-06-27 ENCOUNTER — Other Ambulatory Visit: Payer: Self-pay | Admitting: Physician Assistant

## 2023-06-27 NOTE — Telephone Encounter (Signed)
 Last Fill: 04/15/2023  Next Visit: 07/23/2023  Last Visit: 04/23/2023  Dx: Rheumatoid arthritis with rheumatoid factor of multiple sites without organ or systems involvement   Current Dose per office note on 04/23/2023: Prednisone  5 mg 1 tablet by mouth daily.   Okay to refill Prednisone ?

## 2023-07-01 DIAGNOSIS — I890 Lymphedema, not elsewhere classified: Secondary | ICD-10-CM | POA: Diagnosis not present

## 2023-07-01 DIAGNOSIS — S91302A Unspecified open wound, left foot, initial encounter: Secondary | ICD-10-CM | POA: Diagnosis not present

## 2023-07-01 DIAGNOSIS — L89623 Pressure ulcer of left heel, stage 3: Secondary | ICD-10-CM | POA: Diagnosis not present

## 2023-07-07 DIAGNOSIS — E785 Hyperlipidemia, unspecified: Secondary | ICD-10-CM | POA: Diagnosis not present

## 2023-07-07 DIAGNOSIS — M069 Rheumatoid arthritis, unspecified: Secondary | ICD-10-CM | POA: Diagnosis not present

## 2023-07-07 DIAGNOSIS — D510 Vitamin B12 deficiency anemia due to intrinsic factor deficiency: Secondary | ICD-10-CM | POA: Diagnosis not present

## 2023-07-07 DIAGNOSIS — B079 Viral wart, unspecified: Secondary | ICD-10-CM | POA: Diagnosis not present

## 2023-07-07 DIAGNOSIS — Z6841 Body Mass Index (BMI) 40.0 and over, adult: Secondary | ICD-10-CM | POA: Diagnosis not present

## 2023-07-07 DIAGNOSIS — R928 Other abnormal and inconclusive findings on diagnostic imaging of breast: Secondary | ICD-10-CM | POA: Diagnosis not present

## 2023-07-07 DIAGNOSIS — M1990 Unspecified osteoarthritis, unspecified site: Secondary | ICD-10-CM | POA: Diagnosis not present

## 2023-07-07 DIAGNOSIS — I872 Venous insufficiency (chronic) (peripheral): Secondary | ICD-10-CM | POA: Diagnosis not present

## 2023-07-07 DIAGNOSIS — E1169 Type 2 diabetes mellitus with other specified complication: Secondary | ICD-10-CM | POA: Diagnosis not present

## 2023-07-07 DIAGNOSIS — I10 Essential (primary) hypertension: Secondary | ICD-10-CM | POA: Diagnosis not present

## 2023-07-07 DIAGNOSIS — L89890 Pressure ulcer of other site, unstageable: Secondary | ICD-10-CM | POA: Diagnosis not present

## 2023-07-07 DIAGNOSIS — R600 Localized edema: Secondary | ICD-10-CM | POA: Diagnosis not present

## 2023-07-08 DIAGNOSIS — S91302A Unspecified open wound, left foot, initial encounter: Secondary | ICD-10-CM | POA: Diagnosis not present

## 2023-07-08 DIAGNOSIS — L89623 Pressure ulcer of left heel, stage 3: Secondary | ICD-10-CM | POA: Diagnosis not present

## 2023-07-08 DIAGNOSIS — I890 Lymphedema, not elsewhere classified: Secondary | ICD-10-CM | POA: Diagnosis not present

## 2023-07-09 NOTE — Progress Notes (Unsigned)
 Office Visit Note  Patient: [name] [name]             Date of Birth: 07-24-1950           MRN: [id number]             PCP: Olga Berthold, NP Referring: Olga Berthold, NP Visit Date: 07/23/2023 Occupation: @GUAROCC @  Subjective:  Pain and swelling in both hands   History of Present Illness: [name] [name] is a 73 y.o. female with history of seropositive rheumatoid arthritis.  Patient remains on prednisone  5 mg daily.   She is tolerating prednisone  without any side effects. She remains wheelchair bound. She remains under the care of [place] for management of an ulcer on the left foot.  According to the patient the pressure ulcer has almost completely healed.  Patient is aware that she will need a clearance letter prior to resuming IV Actemra .  Her last Actemra  dose was administered on 11/28/2022.  Patient continues to have inflammation involving both hands.  She has lost some range of motion and flexibility in her hands due to the joint swelling.  She typically uses a compression glove on the right hand and ices both hands. Patient states her freezer recently broke so she has been unable to ice her hands recently.  She has occasional discomfort in both knee joints and occasional discomfort in the right hip due to right trochanter bursitis.  She has been using Thera works, Therapist, sports, lidocaine  for symptomatic relief.  Patient remains primarily wheelchair-bound. Patient is scheduled to establish care with Dr. Welton Hall on 12/02/23 to discuss proceeding with surgical intervention for the left foot.      Activities of Daily Living:  Patient joint stiffness all day  Patient Reports nocturnal pain.  Difficulty dressing/grooming: Reports Difficulty climbing stairs: Reports Difficulty getting out of chair: Reports Difficulty using hands for taps, buttons, cutlery, and/or writing: Reports  Review of Systems  Constitutional:  Negative for fatigue.  HENT:  Negative for mouth sores and mouth  dryness.   Eyes:  Positive for dryness.  Respiratory:  Negative for shortness of breath.   Cardiovascular:  Negative for chest pain and palpitations.  Gastrointestinal:  Negative for blood in stool, constipation and diarrhea.  Endocrine: Negative for increased urination.  Genitourinary:  Negative for involuntary urination.  Musculoskeletal:  Positive for joint pain, gait problem, joint pain, joint swelling, muscle weakness and muscle tenderness. Negative for myalgias, morning stiffness and myalgias.  Skin:  Positive for color change, rash and hair loss. Negative for sensitivity to sunlight.  Allergic/Immunologic: Negative for susceptible to infections.  Neurological:  Negative for dizziness and headaches.  Hematological:  Negative for swollen glands.  Psychiatric/Behavioral:  Negative for depressed mood and sleep disturbance. The patient is not nervous/anxious.     PMFS History:  Patient Active Problem List   Diagnosis Date Noted   Primary osteoarthritis of both knees 04/17/2022   History of hyperlipidemia 04/17/2022   Stage 3a chronic kidney disease (HCC) 04/17/2022   Dyspnea 11/05/2013   Rheumatoid arthritis (HCC)    Hypertension    GERD (gastroesophageal reflux disease)    Morbid obesity (HCC) 03/10/2013   S/P right THA, AA 03/09/2013    Past Medical History:  Diagnosis Date   Deviated nasal septum    right side, cannot breathe out of right septum   Episcleritis periodica fugax of both eyes    GERD (gastroesophageal reflux disease)    Hypertension    Kidney disease  Pinguecula of left eye    Plantar fasciitis    Pressure sore    Left foot   Rheumatoid arthritis (HCC)    oa and [facility] in [facility], shane [facility]   Spondylitis (HCC)    Stasis dermatitis    UTI (lower urinary tract infection) 03/02/2013   treated with keflex for hip surgery on 03/09/13    Family History  Problem Relation Age of Onset   Hypertension Mother    Alzheimer's disease Mother    Heart disease  Father    Kidney disease Father    Heart attack Father    Heart disease Brother    Cancer Brother        agent orange    Heart attack Brother    Vascular Disease Brother    Kidney disease Brother        stage 4   Rheum arthritis Maternal Aunt    Past Surgical History:  Procedure Laterality Date   BIOPSY BREAST Left    [facility]   BREAST BIOPSY Right 11/16/2020   COLON BIOPSY  04/2020   COLONOSCOPY N/A 04/21/2015   DIAGNOSTIC MAMMOGRAM Bilateral 04/05/2016   ingrown toenail removed  5-6 yrs ago   toenail removal Left    TOOTH EXTRACTION  10/08/2022   TOTAL HIP ARTHROPLASTY Right 03/09/2013   Procedure: RIGHT TOTAL HIP ARTHROPLASTY ANTERIOR APPROACH;  Surgeon: Bevin Bucks, MD;  Location: [facility];  Service: Orthopedics;  Laterality: Right;   Social History   Social History Narrative   Not on file   Immunization History  Administered Date(s) Administered   Influenza Split 12/26/2012   Influenza-Unspecified 11/29/2013, 11/07/2020, 11/13/2021, 11/14/2022   Moderna Covid-19 Vaccine  Bivalent Booster 69yrs & up 06/29/2021, 12/12/2021   Moderna Sars-Covid-2 Vaccination 04/01/2019, 04/27/2019, 10/12/2019, 06/22/2020, 11/07/2020   Pneumococcal Polysaccharide-23 03/11/2013   Rsv, Bivalent, Protein Subunit Rsvpref,pf Pattricia Bores) 11/13/2021   Zoster Recombinant(Shingrix) 07/08/2017, 12/30/2017     Objective: Vital Signs: BP 113/68 (BP Location: Left Arm, Patient Position: Sitting, Cuff Size: Normal)   Pulse (!) 58   Resp 15   Ht 5\' 1"  (1.549 m)   Wt 204 lb 3.2 oz (92.6 kg)   BMI 38.58 kg/m    Physical Exam Vitals and nursing note reviewed.  Constitutional:      Appearance: She is well-developed.  HENT:     Head: Normocephalic and atraumatic.  Eyes:     Conjunctiva/sclera: Conjunctivae normal.  Cardiovascular:     Rate and Rhythm: Normal rate and regular rhythm.     Heart sounds: Normal heart sounds.  Pulmonary:     Effort: Pulmonary effort is normal.      Breath sounds: Normal breath sounds.  Abdominal:     General: Bowel sounds are normal.     Palpations: Abdomen is soft.  Musculoskeletal:     Cervical back: Normal range of motion.  Lymphadenopathy:     Cervical: No cervical adenopathy.  Skin:    General: Skin is warm and dry.     Capillary Refill: Capillary refill takes less than 2 seconds.  Neurological:     Mental Status: She is alert and oriented to person, place, and time.  Psychiatric:        Behavior: Behavior normal.      Musculoskeletal Exam: Patient remained in a wheelchair during the exam. C-spine has good ROM. Postural thoracic kyphosis.  Shoulder joints have good ROM.  Limited extension of both elbows.  Rheumatoid nodules on the extensor surface of both elbows.  Synovitis involving severe PIP joints. Incomplete fist formation.  Swan neck deformities noted bilaterally.  Hip joints difficult to assess.  Discomfort with ROM both knees.  Lymphedema in bilateral LE.  Chronic left ankle varus deformity.   CDAI Exam: CDAI Score: -- Patient Global: --; Provider Global: -- Swollen: --; Tender: -- Joint Exam 07/23/2023   No joint exam has been documented for this visit   There is currently no information documented on the homunculus. Go to the Rheumatology activity and complete the homunculus joint exam.  Investigation: No additional findings.  Imaging: No results found.  Recent Labs: Lab Results  Component Value Date   WBC 9.4 10/31/2022   HGB 12.3 10/31/2022   PLT 291 10/31/2022   NA 133 (L) 10/31/2022   K 4.1 10/31/2022   CL 101 10/31/2022   CO2 25 10/31/2022   GLUCOSE 133 (H) 10/31/2022   BUN 23 10/31/2022   CREATININE 1.29 (H) 10/31/2022   BILITOT 1.4 (H) 10/31/2022   ALKPHOS 56 10/31/2022   AST 21 10/31/2022   ALT 22 10/31/2022   PROT 5.8 (L) 10/31/2022   ALBUMIN 3.6 10/31/2022   CALCIUM 8.9 10/31/2022   GFRAA 48 (L) 10/28/2019   QFTBGOLDPLUS Negative 04/30/2022    Speciality Comments: HCQ-SOB,  MTX-SOB, Arava-SOB, Humira-SOB, forgetfullness, Enbrel-Painful injection Actemra -03/2014  Procedures:  No procedures performed Allergies: Allopurinol, Asmanex  (120 metered doses) [mometasone  furoate], Finasteride, Guaifenesin & derivatives, Methotrexate derivatives, Methylprednisolone, Monosodium glutamate, Plaquenil  [hydroxychloroquine  sulfate], Prednisone , Prevnar 13 [pneumococcal 13-val conj vacc], Shingrix [zoster vac recomb adjuvanted], Tetanus toxoids, Baclofen, Ciprofloxacin, Epiceram, Humira [adalimumab], Keratin, Leflunomide, Other, Polysporin [bacitracin-polymyxin b], Rosuvastatin, Vitamin e, Neosporin [neomycin-bacitracin zn-polymyx], and Sulfa antibiotics   Assessment / Plan:     Visit Diagnoses: Rheumatoid arthritis with rheumatoid factor of multiple sites without organ or systems involvement (HCC) - Dx 20 years ago, Followed by Dr. Henrine Logan. 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 ESR 18, CRP<1: Patient presents today with ongoing inflammation involving both wrists and both hands.  She is unable to make a complete fist due to the inflammation involving her PIP joints today.  She has occasional discomfort in her knee joints and has been using topical agents including Biofreeze, lidocaine , and Thera works for pain relief.  She remains on prednisone  5 mg 1 tablet by mouth daily.  Her last IV Actemra  infusion was administered on 11/28/2023.  She has been under the care of the wound clinic for an ulcer on the left foot.  According to the patient the ulcer has almost completely healed--she is aware that she will require written clearance to resume IV Actemra  once the ulcer has healed.  Patient states that she will also be establishing care with Dr. Welton Hall at [facility] on 12/02/2023 to discuss surgical intervention for reconstruction of the left foot in the future.  She is aware that she will eventually need to hold IV Actemra  again prior to surgery. Patient will notify us  once she has received written clearance to resume IV  Actemra .  Remain on low-dose prednisone  5 mg daily for now.  She will follow-up in the office in 3 months or sooner if needed.  High risk medication use - Prednisone  5 mg 1 tablet by mouth daily. Holding IV actemra ---last infusion was on 11/28/2022.  Awaiting clearance to resume Actemra  once the ulcer on her left foot has completely healed.  She will have a clearance letter since to our office to resume actemra  once the pressure ulcer has completely healed.   CBC and CMP updated on 07/07/23-hemoglobin A1c was  6.1%, lipid panel within normal limits, creatinine 1.1, GFR 53, AST 15, ALT 11, white blood cell count within normal limits, red blood cell count 3.78, hemoglobin 11, platelets 409K. She will hopefully be able to transition back to have lab work with infusions once actemra  is restarted.   TB gold negative 04/30/22.  Order for TB gold released today.   - Plan: QuantiFERON-TB Gold Plus  Screening for tuberculosis - Order for TB gold released today. Plan: QuantiFERON-TB Gold Plus  Current chronic use of systemic steroids: She is currently taking prednisone  5 mg 1 tablet by mouth daily.  She is aware of the risks of long-term prednisone  use. Hemoglobin A1c was obtained on 07/07/2023--6.1%  Elevated serum creatinine - Under care of Dr. Gloris Lars with GFR updated on 07/07/2023--creatinine was 1.1 and GFR was 53.  Positive TB test  Status post total hip replacement, right: Doing well.  No groin pain at this time.  She is occasional discomfort in the lateral aspect of her right hip consistent with trochanteric bursitis.  She has been using Thera works and Biofreeze topically as needed for pain relief.  Pain in left ankle and joints of left foot - Charcot's joint, left ankle and foot--Evaluated by Dr. Marvis Sluder 04/15/23.  Patient has been referred to Dr. Welton Hall at [facility] to discuss surgical intervention--scheduled on 12/02/2023.  Patient is still healing from a pressure ulcer on the left foot--patient has been  holding IV Actemra  and is awaiting clearance to resume therapy once the wound has completely healed.  Primary osteoarthritis of both knees: Patient continues to experience occasional discomfort in both knee joints.  No effusion was noted on examination today.  She is wheelchair-bound.  Pain in thoracic spine: Thoracic kyphosis noted.  Chronic bilateral low back pain without sciatica: Intermittent discomfort.  No symptoms of radiculopathy at this time.  Other medical conditions are listed as follows:  Essential hypertension: Blood pressure was 113/68 today in the office.  History of hyperlipidemia: Lipid panel updated on 07/07/2023  Stage 3a chronic kidney disease (HCC)  Family history of rheumatoid arthritis  Former smoker  History of anemia  Pulmonary nodules  History of gastroesophageal reflux (GERD)    Orders: Orders Placed This Encounter  Procedures   QuantiFERON-TB Gold Plus   No orders of the defined types were placed in this encounter.   Follow-Up Instructions: Return in about 3 months (around 10/23/2023) for Rheumatoid arthritis.   Romayne Clubs, PA-C  Note - This record has been created using Dragon software.  Chart creation errors have been sought, but may not always  have been located. Such creation errors do not reflect on  the standard of medical care.

## 2023-07-15 DIAGNOSIS — S91302A Unspecified open wound, left foot, initial encounter: Secondary | ICD-10-CM | POA: Diagnosis not present

## 2023-07-15 DIAGNOSIS — I890 Lymphedema, not elsewhere classified: Secondary | ICD-10-CM | POA: Diagnosis not present

## 2023-07-15 DIAGNOSIS — L89623 Pressure ulcer of left heel, stage 3: Secondary | ICD-10-CM | POA: Diagnosis not present

## 2023-07-16 ENCOUNTER — Telehealth: Payer: Self-pay | Admitting: Allergy and Immunology

## 2023-07-16 NOTE — Telephone Encounter (Signed)
 FYI- Patient called to cancel yearly OV because she is not able to walk at the moment. She is having foot surgery in October and will not know how long she will be in recovery. She is only able to get around with a wheelchair but does not have a way to put wheelchair into her car to come see us  since she lives alone. She asked if we had a wheelchair in our [place] office and I told her we did not. I spoke with Dr. Kozlow and he was OK with her cancelling her appointment since she is feeling fine. I did inform her that she can give us  a call and we will take care of her refills in the meantime. She will make sure to give us  a call to reschedule as soon as she is able to.

## 2023-07-22 DIAGNOSIS — S91302A Unspecified open wound, left foot, initial encounter: Secondary | ICD-10-CM | POA: Diagnosis not present

## 2023-07-22 DIAGNOSIS — L89623 Pressure ulcer of left heel, stage 3: Secondary | ICD-10-CM | POA: Diagnosis not present

## 2023-07-22 DIAGNOSIS — I890 Lymphedema, not elsewhere classified: Secondary | ICD-10-CM | POA: Diagnosis not present

## 2023-07-23 ENCOUNTER — Encounter: Payer: Self-pay | Admitting: Physician Assistant

## 2023-07-23 ENCOUNTER — Ambulatory Visit: Payer: Medicare Other | Attending: Physician Assistant | Admitting: Physician Assistant

## 2023-07-23 VITALS — BP 113/68 | HR 58 | Resp 15 | Ht 61.0 in | Wt 204.2 lb

## 2023-07-23 DIAGNOSIS — Z87891 Personal history of nicotine dependence: Secondary | ICD-10-CM | POA: Diagnosis not present

## 2023-07-23 DIAGNOSIS — Z96641 Presence of right artificial hip joint: Secondary | ICD-10-CM | POA: Insufficient documentation

## 2023-07-23 DIAGNOSIS — Z862 Personal history of diseases of the blood and blood-forming organs and certain disorders involving the immune mechanism: Secondary | ICD-10-CM | POA: Diagnosis not present

## 2023-07-23 DIAGNOSIS — G8929 Other chronic pain: Secondary | ICD-10-CM | POA: Diagnosis not present

## 2023-07-23 DIAGNOSIS — M546 Pain in thoracic spine: Secondary | ICD-10-CM | POA: Insufficient documentation

## 2023-07-23 DIAGNOSIS — N1831 Chronic kidney disease, stage 3a: Secondary | ICD-10-CM | POA: Insufficient documentation

## 2023-07-23 DIAGNOSIS — R7989 Other specified abnormal findings of blood chemistry: Secondary | ICD-10-CM | POA: Diagnosis not present

## 2023-07-23 DIAGNOSIS — R7611 Nonspecific reaction to tuberculin skin test without active tuberculosis: Secondary | ICD-10-CM | POA: Insufficient documentation

## 2023-07-23 DIAGNOSIS — Z8639 Personal history of other endocrine, nutritional and metabolic disease: Secondary | ICD-10-CM | POA: Diagnosis not present

## 2023-07-23 DIAGNOSIS — M170 Bilateral primary osteoarthritis of knee: Secondary | ICD-10-CM | POA: Insufficient documentation

## 2023-07-23 DIAGNOSIS — Z7952 Long term (current) use of systemic steroids: Secondary | ICD-10-CM | POA: Insufficient documentation

## 2023-07-23 DIAGNOSIS — I10 Essential (primary) hypertension: Secondary | ICD-10-CM | POA: Diagnosis not present

## 2023-07-23 DIAGNOSIS — M25572 Pain in left ankle and joints of left foot: Secondary | ICD-10-CM | POA: Diagnosis not present

## 2023-07-23 DIAGNOSIS — Z8261 Family history of arthritis: Secondary | ICD-10-CM | POA: Diagnosis not present

## 2023-07-23 DIAGNOSIS — M0579 Rheumatoid arthritis with rheumatoid factor of multiple sites without organ or systems involvement: Secondary | ICD-10-CM | POA: Insufficient documentation

## 2023-07-23 DIAGNOSIS — Z111 Encounter for screening for respiratory tuberculosis: Secondary | ICD-10-CM | POA: Insufficient documentation

## 2023-07-23 DIAGNOSIS — R918 Other nonspecific abnormal finding of lung field: Secondary | ICD-10-CM | POA: Insufficient documentation

## 2023-07-23 DIAGNOSIS — Z8719 Personal history of other diseases of the digestive system: Secondary | ICD-10-CM | POA: Diagnosis not present

## 2023-07-23 DIAGNOSIS — M545 Low back pain, unspecified: Secondary | ICD-10-CM | POA: Insufficient documentation

## 2023-07-23 DIAGNOSIS — Z79899 Other long term (current) drug therapy: Secondary | ICD-10-CM | POA: Insufficient documentation

## 2023-07-25 LAB — QUANTIFERON-TB GOLD PLUS
Mitogen-NIL: 0.35 [IU]/mL
NIL: 0.02 [IU]/mL
QuantiFERON-TB Gold Plus: UNDETERMINED — AB
TB1-NIL: 0 [IU]/mL
TB2-NIL: 0 [IU]/mL

## 2023-07-26 ENCOUNTER — Telehealth: Payer: Self-pay | Admitting: Pharmacist

## 2023-07-26 NOTE — Telephone Encounter (Addendum)
 IV Actemra  does not require pre-certification. Patient has Medicare Part B that will cover 80% of cost with 20% coinsurance that is patient's responsibility. This 20% will be covered by her [employer] after Part B deductible is met.  Once clearance is received from wound center, we can place renewed infusions orders at [facility] for patient  Please notify pharmacy team once clearance is received from wound management  Sherry Pennant, PharmD, MPH, BCPS, CPP Clinical Pharmacist (Rheumatology and Pulmonology)  ----- Message from [facility] Marissa G sent at 07/23/2023  4:26 PM EDT ----- Patient is awaiting clearance from the wound center to resume Actemra  infusions. Are the infusions approved or is there anything that needs to be completed for the PA? Thanks!   Patient was also asking if the infusion center is open yet in [place]. I called [address] and was advised the  location is not open yet, they are still working on it.

## 2023-07-28 ENCOUNTER — Ambulatory Visit: Payer: Self-pay | Admitting: Physician Assistant

## 2023-07-28 NOTE — Progress Notes (Signed)
 TB gold is in indeterminate range-likely related to prednisone  use.   Recommend repeating TB gold in 2 weeks or she can have a PPD performed at PCP office.

## 2023-07-29 DIAGNOSIS — S91302A Unspecified open wound, left foot, initial encounter: Secondary | ICD-10-CM | POA: Diagnosis not present

## 2023-07-29 DIAGNOSIS — L89894 Pressure ulcer of other site, stage 4: Secondary | ICD-10-CM | POA: Diagnosis not present

## 2023-08-05 DIAGNOSIS — S91302A Unspecified open wound, left foot, initial encounter: Secondary | ICD-10-CM | POA: Diagnosis not present

## 2023-08-05 DIAGNOSIS — L89894 Pressure ulcer of other site, stage 4: Secondary | ICD-10-CM | POA: Diagnosis not present

## 2023-08-11 ENCOUNTER — Ambulatory Visit: Payer: Medicare Other | Admitting: Allergy and Immunology

## 2023-08-12 DIAGNOSIS — S91302A Unspecified open wound, left foot, initial encounter: Secondary | ICD-10-CM | POA: Diagnosis not present

## 2023-08-12 DIAGNOSIS — L89894 Pressure ulcer of other site, stage 4: Secondary | ICD-10-CM | POA: Diagnosis not present

## 2023-08-13 DIAGNOSIS — Z111 Encounter for screening for respiratory tuberculosis: Secondary | ICD-10-CM | POA: Diagnosis not present

## 2023-08-13 DIAGNOSIS — D510 Vitamin B12 deficiency anemia due to intrinsic factor deficiency: Secondary | ICD-10-CM | POA: Diagnosis not present

## 2023-08-26 DIAGNOSIS — S91302A Unspecified open wound, left foot, initial encounter: Secondary | ICD-10-CM | POA: Diagnosis not present

## 2023-08-26 DIAGNOSIS — L89623 Pressure ulcer of left heel, stage 3: Secondary | ICD-10-CM | POA: Diagnosis not present

## 2023-09-01 ENCOUNTER — Telehealth: Payer: Self-pay

## 2023-09-01 NOTE — Telephone Encounter (Signed)
 Contacted patient to advise likely related to chronic prednisone  use. Patient verbalized understanding.

## 2023-09-01 NOTE — Telephone Encounter (Signed)
Likely related to chronic prednisone use.

## 2023-09-01 NOTE — Telephone Encounter (Signed)
 Patient would like to know if an iron infusion would have caused the TB Gold results to be what they were ? She had one at the end of march and first week of April.

## 2023-09-02 DIAGNOSIS — S91302A Unspecified open wound, left foot, initial encounter: Secondary | ICD-10-CM | POA: Diagnosis not present

## 2023-09-02 DIAGNOSIS — L89623 Pressure ulcer of left heel, stage 3: Secondary | ICD-10-CM | POA: Diagnosis not present

## 2023-09-04 ENCOUNTER — Other Ambulatory Visit: Payer: Self-pay | Admitting: Rheumatology

## 2023-09-04 DIAGNOSIS — R928 Other abnormal and inconclusive findings on diagnostic imaging of breast: Secondary | ICD-10-CM | POA: Diagnosis not present

## 2023-09-04 NOTE — Telephone Encounter (Signed)
 Last Fill: 06/27/2023  Next Visit: 10/23/2023  Last Visit: 07/23/2023  Dx: Rheumatoid arthritis with rheumatoid factor of multiple sites without organ or systems involvement   Current Dose per office note on 07/23/2023: Prednisone  5 mg 1 tablet by mouth daily.   Okay to refill Prednisone ?

## 2023-09-09 DIAGNOSIS — S91302A Unspecified open wound, left foot, initial encounter: Secondary | ICD-10-CM | POA: Diagnosis not present

## 2023-09-09 DIAGNOSIS — L89623 Pressure ulcer of left heel, stage 3: Secondary | ICD-10-CM | POA: Diagnosis not present

## 2023-09-16 DIAGNOSIS — S91302A Unspecified open wound, left foot, initial encounter: Secondary | ICD-10-CM | POA: Diagnosis not present

## 2023-09-16 DIAGNOSIS — L89623 Pressure ulcer of left heel, stage 3: Secondary | ICD-10-CM | POA: Diagnosis not present

## 2023-09-23 DIAGNOSIS — L89623 Pressure ulcer of left heel, stage 3: Secondary | ICD-10-CM | POA: Diagnosis not present

## 2023-09-23 DIAGNOSIS — S91302A Unspecified open wound, left foot, initial encounter: Secondary | ICD-10-CM | POA: Diagnosis not present

## 2023-09-24 DIAGNOSIS — D510 Vitamin B12 deficiency anemia due to intrinsic factor deficiency: Secondary | ICD-10-CM | POA: Diagnosis not present

## 2023-09-30 DIAGNOSIS — S91302A Unspecified open wound, left foot, initial encounter: Secondary | ICD-10-CM | POA: Diagnosis not present

## 2023-09-30 DIAGNOSIS — L89623 Pressure ulcer of left heel, stage 3: Secondary | ICD-10-CM | POA: Diagnosis not present

## 2023-10-07 DIAGNOSIS — L89623 Pressure ulcer of left heel, stage 3: Secondary | ICD-10-CM | POA: Diagnosis not present

## 2023-10-07 DIAGNOSIS — S91302A Unspecified open wound, left foot, initial encounter: Secondary | ICD-10-CM | POA: Diagnosis not present

## 2023-10-09 NOTE — Progress Notes (Signed)
 Office Visit Note  Patient: [name] [name]             Date of Birth: Mar 06, 1950           MRN: [id number]             PCP: Erick Greig LABOR, NP Referring: Erick Greig LABOR, NP Visit Date: 10/23/2023 Occupation: @GUAROCC @  Subjective:  Discuss restarting actemra    History of Present Illness: [name] [name] is a 73 y.o. female with history of seropositive rheumatoid arthritis.  Patient is currently taking prednisone  5 mg daily.  Patient continues to experience intermittent pain and swelling in both hands.  She has been using ice packs on her hands daily which has helped to subside the inflammation. Patient has been cleared by wound care and will no longer be following up since the ulcer on her left foot has healed.  Patient would like to resume Actemra  infusions as soon as possible. Patient states that she is scheduled to establish care with Dr. Clause at [facility] orthopedics on 12/02/2023 for evaluation and discussion of proceeding with left foot reconstructive surgery.  She is aware that she will have to hold Actemra  around the time of surgery once scheduled.    Activities of Daily Living:  Patient reports morning stiffness for 0 minute.   Patient Denies nocturnal pain.  Difficulty dressing/grooming: Reports Difficulty climbing stairs: Reports Difficulty getting out of chair: Denies Difficulty using hands for taps, buttons, cutlery, and/or writing: Reports  Review of Systems  Constitutional:  Negative for fatigue.  HENT:  Negative for mouth sores and mouth dryness.   Eyes:  Positive for dryness.  Respiratory:  Negative for shortness of breath.   Cardiovascular:  Negative for chest pain and palpitations.  Gastrointestinal:  Negative for blood in stool, constipation and diarrhea.  Endocrine: Negative for increased urination.  Genitourinary:  Negative for involuntary urination.  Musculoskeletal:  Positive for joint pain, gait problem, joint pain, joint swelling, myalgias and myalgias. Negative  for muscle weakness, morning stiffness and muscle tenderness.  Skin:  Negative for color change, rash, hair loss and sensitivity to sunlight.  Allergic/Immunologic: Negative for susceptible to infections.  Neurological:  Negative for dizziness and headaches.  Hematological:  Negative for swollen glands.  Psychiatric/Behavioral:  Positive for sleep disturbance. Negative for depressed mood. The patient is not nervous/anxious.     PMFS History:  Patient Active Problem List   Diagnosis Date Noted   Primary osteoarthritis of both knees 04/17/2022   History of hyperlipidemia 04/17/2022   Stage 3a chronic kidney disease (HCC) 04/17/2022   Dyspnea 11/05/2013   Rheumatoid arthritis (HCC)    Hypertension    GERD (gastroesophageal reflux disease)    Morbid obesity (HCC) 03/10/2013   S/P right THA, AA 03/09/2013    Past Medical History:  Diagnosis Date   Deviated nasal septum    right side, cannot breathe out of right septum   Episcleritis periodica fugax of both eyes    GERD (gastroesophageal reflux disease)    Hypertension    Kidney disease    Pinguecula of left eye    Plantar fasciitis    Pressure sore    Left foot   Rheumatoid arthritis (HCC)    oa and ra in [facility], [facility]   Spondylitis (HCC)    Stasis dermatitis    UTI (lower urinary tract infection) 03/02/2013   treated with keflex for hip surgery on 03/09/13    Family History  Problem Relation Age of Onset  Hypertension Mother    Alzheimer's disease Mother    Heart disease Father    Kidney disease Father    Heart attack Father    Heart disease Brother    Cancer Brother        agent orange    Heart attack Brother    Vascular Disease Brother    Kidney disease Brother        stage 4   Rheum arthritis Maternal Aunt    Past Surgical History:  Procedure Laterality Date   BIOPSY BREAST Left    [facility]   BREAST BIOPSY Right 11/16/2020   COLON BIOPSY  04/2020   COLONOSCOPY N/A 04/21/2015   DIAGNOSTIC  MAMMOGRAM Bilateral 04/05/2016   ingrown toenail removed  5-6 yrs ago   toenail removal Left    TOOTH EXTRACTION  10/08/2022   TOTAL HIP ARTHROPLASTY Right 03/09/2013   Procedure: RIGHT TOTAL HIP ARTHROPLASTY ANTERIOR APPROACH;  Surgeon: Donnice JONETTA Car, MD;  Location: [facility];  Service: Orthopedics;  Laterality: Right;   Social History   Social History Narrative   Not on file   Immunization History  Administered Date(s) Administered    sv, Bivalent, Protein Subunit Rsvpref,pf (Abrysvo) 11/13/2021   Influenza Split 12/26/2012   Influenza-Unspecified 11/29/2013, 11/07/2020, 11/13/2021, 11/14/2022   Moderna Covid-19 Vaccine  Bivalent Booster 29yrs & up 06/29/2021, 12/12/2021   Moderna Sars-Covid-2 Vaccination 04/01/2019, 04/27/2019, 10/12/2019, 06/22/2020, 11/07/2020   Pneumococcal Polysaccharide-23 03/11/2013   Zoster Recombinant(Shingrix) 07/08/2017, 12/30/2017     Objective: Vital Signs: BP 116/72 (BP Location: Left Arm, Patient Position: Sitting, Cuff Size: Normal)   Pulse 66   Resp 14   Ht 5' 1 (1.549 m)   Wt 195 lb 6.4 oz (88.6 kg)   BMI 36.92 kg/m    Physical Exam Vitals and nursing note reviewed.  Constitutional:      Appearance: She is well-developed.  HENT:     Head: Normocephalic and atraumatic.  Eyes:     Conjunctiva/sclera: Conjunctivae normal.  Cardiovascular:     Rate and Rhythm: Normal rate and regular rhythm.     Heart sounds: Normal heart sounds.  Pulmonary:     Effort: Pulmonary effort is normal.     Breath sounds: Normal breath sounds.  Abdominal:     General: Bowel sounds are normal.     Palpations: Abdomen is soft.  Musculoskeletal:     Cervical back: Normal range of motion.  Lymphadenopathy:     Cervical: No cervical adenopathy.  Skin:    General: Skin is warm and dry.     Capillary Refill: Capillary refill takes less than 2 seconds.  Neurological:     Mental Status: She is alert and oriented to person, place, and time.  Psychiatric:         Behavior: Behavior normal.      Musculoskeletal Exam: Patient remained in the wheelchair during the examination today.  C-spine has good range of motion.  Posture thoracic kyphosis noted.  Shoulder joints have good range of motion with no discomfort.  Limited extension of both elbow joints.  Rheumatoid nodules noted extensor surface of both elbows.  Inflammation noted in the left 4th and 5th PIP joints.  Swan-neck deformities noted bilateral.  Incomplete fist formation.  Hip joints able to assess in seated position.  Knee joints have good range of motion with no effusion.  Lymphedema in bilateral lower extremities.  Chronic left foot ankle varus deformity.   CDAI Exam: CDAI Score: -- Patient Global: --; Provider Global: --  Swollen: --; Tender: -- Joint Exam 10/23/2023   No joint exam has been documented for this visit   There is currently no information documented on the homunculus. Go to the Rheumatology activity and complete the homunculus joint exam.  Investigation: No additional findings.  Imaging: No results found.  Recent Labs: Lab Results  Component Value Date   WBC 9.4 10/31/2022   HGB 12.3 10/31/2022   PLT 291 10/31/2022   NA 133 (L) 10/31/2022   K 4.1 10/31/2022   CL 101 10/31/2022   CO2 25 10/31/2022   GLUCOSE 133 (H) 10/31/2022   BUN 23 10/31/2022   CREATININE 1.29 (H) 10/31/2022   BILITOT 1.4 (H) 10/31/2022   ALKPHOS 56 10/31/2022   AST 21 10/31/2022   ALT 22 10/31/2022   PROT 5.8 (L) 10/31/2022   ALBUMIN 3.6 10/31/2022   CALCIUM 8.9 10/31/2022   GFRAA 48 (L) 10/28/2019   QFTBGOLDPLUS INDETERMINATE (A) 07/23/2023    Speciality Comments: HCQ-SOB, MTX-SOB, Arava-SOB, Humira-SOB, forgetfullness, Enbrel-Painful injection Actemra -03/2014  Procedures:  No procedures performed Allergies: Allopurinol, Asmanex  (120 metered doses) [mometasone  furoate], Finasteride, Guaifenesin & derivatives, Methotrexate derivatives, Methylprednisolone, Monosodium glutamate,  Plaquenil  [hydroxychloroquine  sulfate], Prednisone , Prevnar 13 [pneumococcal 13-val conj vacc], Shingrix [zoster vac recomb adjuvanted], Tetanus toxoids, Baclofen, Ciprofloxacin, Epiceram, Humira [adalimumab], Keratin, Leflunomide, Other, Polysporin [bacitracin-polymyxin b], Rosuvastatin, Vitamin e, Neosporin [neomycin-bacitracin zn-polymyx], and Sulfa antibiotics     Assessment / Plan:     Visit Diagnoses: Rheumatoid arthritis with rheumatoid factor of multiple sites without organ or systems involvement (HCC) - Dx 20 years ago, Followed by Dr. Leni. 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 ESR 18, CRP<1: Patient continues to experience intermittent pain and swelling in both hands.  She has been using ice packs on her hands on a daily basis to help alleviate the inflammation.  She has been taking prednisone  5 mg 1 tablet daily.  Her last dose of IV Actemra  was administered on 11/28/2023.  She has been under the care of wound care due to a nonhealing ulcer on her left foot.  The wound has healed and she will be no longer following up at the wound center.  She presents today to discuss reinitiating IV Actemra . Plan to reduce the dose of IV Actemra  from 8 mg/kg to 4 mg/kg IV infusions every 4 weeks to reduce the risks of immunosuppression.  The patient was in agreement.  Plan to also reduce prednisone  from 5 mg to 4 mg daily once she has resumed IV Actemra .  The plan will be for her to try to reduce prednisone  by 1 mg every month and then discontinue.  She will follow up in 3 months or sooner if needed.   High risk medication use -Current therapy: Prednisone  5 mg 1 tablet by mouth daily.  Last Actemra  infusion 8 mg/kg administered on 11/28/2023. Plan to reduce the dose of Actemra  to 4 mg/kg infusions to reduce the risks of immunosuppression.  Patient voiced understanding. New orders for actemra  will need to be placed. Plan to try reducing prednisone  by 1 mg every month as tolerated.  A new prescription for 4 mg of prednisone   will be sent to the pharmacy today but she will hold off on trying to reduce the dose until after she receives the IV dose of Actemra . CBC and CMP were updated on 10/10/2023: White blood cell count 12.7, white blood cell count 3.62, hemoglobin 11.1, hematocrit 34.3, platelet count 396K, AST 14, ALT 13, creatinine 1.01 and GFR 59. TB Gold negative on 08/18/2023 Lipid panel  obtained on 10/10/2023: Total cholesterol 120, triglycerides 77, HDL 49, LDL 55 Hemoglobin J8r 5.6% on 10/10/2023. Discussed the importance of holding Actemra  if she develops signs or symptoms of infection or if the ulcer on her left foot reopens.  Patient will notify us  when and if she is scheduled for reconstructive left foot surgery after her consultation with Dr. Myra on 12/02/2023.  She is aware that she will have to stop Actemra  prior to surgical intervention and will require clearance by Dr. Myra after surgery to resume Actemra .  Current chronic use of systemic steroids - Currently taking prednisone  5 mg 1 tablet by mouth daily.  She is aware of the risks of long-term prednisone  use. Hemoglobin A1c was 5.6% on 10/10/2023. Plan to try reducing prednisone  by 1 mg every month as tolerated.  Elevated serum creatinine - Followed by Dr. Freya was 1.01 and GFR was 59 on 10/10/2023.  She is scheduled for an upcoming appointment with Dr. Gearline soon.   Positive TB test: TB Gold negative on 08/18/2023.  Status post total hip replacement, right: Doing well.  No groin pain currently.   Pain in left ankle and joints of left foot - Evaluated by Dr. Lamount 04/15/23.  Patient has been referred to Dr. Myra at [facility] to discuss surgical intervention--scheduled on 12/02/2023--considering reconstruction/fusion.  Patient is aware that she will have to hold Actemra  prior to surgery and will require clearance by Dr. Myra prior to resuming surgery during the postoperative period.   Primary osteoarthritis of both knees: She has good range of  motion of both knee joints on examination today.  No effusion noted.  Lymphedema noted in bilateral lower extremities.  She remained in the wheelchair during examination today.  Pain in thoracic spine: Thoracic kyphosis noted.   Chronic bilateral low back pain without sciatica: No symptoms of sciatica.   Essential hypertension: Blood pressure was 116/72 today in the office.  History of hyperlipidemia: Lipid panel updated on 10/10/2023  Stage 3a chronic kidney disease (HCC)  Family history of rheumatoid arthritis  Former smoker  History of anemia  Pulmonary nodules  History of gastroesophageal reflux (GERD)  Orders: No orders of the defined types were placed in this encounter.  Meds ordered this encounter  Medications   predniSONE  (DELTASONE ) 1 MG tablet    Sig: Take 4 tablets (4 mg total) by mouth daily with breakfast.    Dispense:  120 tablet    Refill:  0     Follow-Up Instructions: Return in about 3 months (around 01/23/2024) for Rheumatoid arthritis.   Waddell CHRISTELLA Craze, PA-C  Note - This record has been created using Dragon software.  Chart creation errors have been sought, but may not always  have been located. Such creation errors do not reflect on  the standard of medical care.

## 2023-10-10 DIAGNOSIS — I10 Essential (primary) hypertension: Secondary | ICD-10-CM | POA: Diagnosis not present

## 2023-10-10 DIAGNOSIS — E785 Hyperlipidemia, unspecified: Secondary | ICD-10-CM | POA: Diagnosis not present

## 2023-10-10 DIAGNOSIS — L89890 Pressure ulcer of other site, unstageable: Secondary | ICD-10-CM | POA: Diagnosis not present

## 2023-10-10 DIAGNOSIS — M1990 Unspecified osteoarthritis, unspecified site: Secondary | ICD-10-CM | POA: Diagnosis not present

## 2023-10-10 DIAGNOSIS — Z6838 Body mass index (BMI) 38.0-38.9, adult: Secondary | ICD-10-CM | POA: Diagnosis not present

## 2023-10-10 DIAGNOSIS — R739 Hyperglycemia, unspecified: Secondary | ICD-10-CM | POA: Diagnosis not present

## 2023-10-10 DIAGNOSIS — I872 Venous insufficiency (chronic) (peripheral): Secondary | ICD-10-CM | POA: Diagnosis not present

## 2023-10-10 DIAGNOSIS — D510 Vitamin B12 deficiency anemia due to intrinsic factor deficiency: Secondary | ICD-10-CM | POA: Diagnosis not present

## 2023-10-10 DIAGNOSIS — E66812 Obesity, class 2: Secondary | ICD-10-CM | POA: Diagnosis not present

## 2023-10-10 DIAGNOSIS — M069 Rheumatoid arthritis, unspecified: Secondary | ICD-10-CM | POA: Diagnosis not present

## 2023-10-10 DIAGNOSIS — R600 Localized edema: Secondary | ICD-10-CM | POA: Diagnosis not present

## 2023-10-11 LAB — COMPREHENSIVE METABOLIC PANEL WITH GFR: EGFR: 59

## 2023-10-11 LAB — HEMOGLOBIN A1C: A1c: 5.6

## 2023-10-14 DIAGNOSIS — S91302A Unspecified open wound, left foot, initial encounter: Secondary | ICD-10-CM | POA: Diagnosis not present

## 2023-10-14 DIAGNOSIS — L89623 Pressure ulcer of left heel, stage 3: Secondary | ICD-10-CM | POA: Diagnosis not present

## 2023-10-15 ENCOUNTER — Telehealth: Payer: Self-pay

## 2023-10-15 NOTE — Telephone Encounter (Signed)
 Labs received from: [facility]  Drawn on: 10/10/2023  Reviewed by: Cheryl Waddell HERO, PA-C  Labs drawn: CBC w/ Diff, CMP, Lipid Panel, and Hemoglobin A1c  Results:  WBC 12.7 RBC 3.62 Neutrophils 10.9 Creatinine 1.01 eGFR 59 Sodium 133 Albumin 3.6  Per Waddell Cheryl, please clarify if she has had any recent infections? Ulcer? WBC is slightly more elevated.   Patient states she had a pressure sore on her left foot, patient states she was advised this Tuesday that the sore has closed up. Patient states she is going to the doctor again Thursday just to be sure and she will get a clearance letter to bring to her next appointment.

## 2023-10-21 DIAGNOSIS — S91302A Unspecified open wound, left foot, initial encounter: Secondary | ICD-10-CM | POA: Diagnosis not present

## 2023-10-21 DIAGNOSIS — L89623 Pressure ulcer of left heel, stage 3: Secondary | ICD-10-CM | POA: Diagnosis not present

## 2023-10-22 DIAGNOSIS — D510 Vitamin B12 deficiency anemia due to intrinsic factor deficiency: Secondary | ICD-10-CM | POA: Diagnosis not present

## 2023-10-23 ENCOUNTER — Ambulatory Visit: Attending: Physician Assistant | Admitting: Physician Assistant

## 2023-10-23 ENCOUNTER — Telehealth: Payer: Self-pay

## 2023-10-23 ENCOUNTER — Encounter: Payer: Self-pay | Admitting: Physician Assistant

## 2023-10-23 VITALS — BP 116/72 | HR 66 | Resp 14 | Ht 61.0 in | Wt 195.4 lb

## 2023-10-23 DIAGNOSIS — Z8639 Personal history of other endocrine, nutritional and metabolic disease: Secondary | ICD-10-CM | POA: Diagnosis not present

## 2023-10-23 DIAGNOSIS — Z8719 Personal history of other diseases of the digestive system: Secondary | ICD-10-CM | POA: Insufficient documentation

## 2023-10-23 DIAGNOSIS — Z8261 Family history of arthritis: Secondary | ICD-10-CM | POA: Insufficient documentation

## 2023-10-23 DIAGNOSIS — N1831 Chronic kidney disease, stage 3a: Secondary | ICD-10-CM | POA: Insufficient documentation

## 2023-10-23 DIAGNOSIS — R918 Other nonspecific abnormal finding of lung field: Secondary | ICD-10-CM | POA: Insufficient documentation

## 2023-10-23 DIAGNOSIS — Z862 Personal history of diseases of the blood and blood-forming organs and certain disorders involving the immune mechanism: Secondary | ICD-10-CM | POA: Insufficient documentation

## 2023-10-23 DIAGNOSIS — M545 Low back pain, unspecified: Secondary | ICD-10-CM | POA: Diagnosis not present

## 2023-10-23 DIAGNOSIS — Z7952 Long term (current) use of systemic steroids: Secondary | ICD-10-CM | POA: Diagnosis not present

## 2023-10-23 DIAGNOSIS — M170 Bilateral primary osteoarthritis of knee: Secondary | ICD-10-CM | POA: Diagnosis not present

## 2023-10-23 DIAGNOSIS — I10 Essential (primary) hypertension: Secondary | ICD-10-CM | POA: Diagnosis not present

## 2023-10-23 DIAGNOSIS — Z87891 Personal history of nicotine dependence: Secondary | ICD-10-CM | POA: Insufficient documentation

## 2023-10-23 DIAGNOSIS — M25572 Pain in left ankle and joints of left foot: Secondary | ICD-10-CM | POA: Diagnosis not present

## 2023-10-23 DIAGNOSIS — M0579 Rheumatoid arthritis with rheumatoid factor of multiple sites without organ or systems involvement: Secondary | ICD-10-CM | POA: Insufficient documentation

## 2023-10-23 DIAGNOSIS — G8929 Other chronic pain: Secondary | ICD-10-CM | POA: Insufficient documentation

## 2023-10-23 DIAGNOSIS — R7989 Other specified abnormal findings of blood chemistry: Secondary | ICD-10-CM | POA: Insufficient documentation

## 2023-10-23 DIAGNOSIS — M546 Pain in thoracic spine: Secondary | ICD-10-CM | POA: Diagnosis not present

## 2023-10-23 DIAGNOSIS — Z79899 Other long term (current) drug therapy: Secondary | ICD-10-CM | POA: Diagnosis not present

## 2023-10-23 DIAGNOSIS — R7611 Nonspecific reaction to tuberculin skin test without active tuberculosis: Secondary | ICD-10-CM | POA: Diagnosis not present

## 2023-10-23 DIAGNOSIS — Z96641 Presence of right artificial hip joint: Secondary | ICD-10-CM | POA: Insufficient documentation

## 2023-10-23 MED ORDER — PREDNISONE 1 MG PO TABS: 4.0000 mg | ORAL_TABLET | Freq: Every day | ORAL | 0 refills | Status: DC

## 2023-10-23 NOTE — Telephone Encounter (Signed)
 Ok to proceed with Covid-19 vaccine and also recommend high-dose influenza vaccine this fall. No interruption in therapy needed.

## 2023-10-23 NOTE — Telephone Encounter (Signed)
 Patient contacted the office and states she was previously told by the pharmacy that she could not get the COVID shot last year because of the open wounds she had with her feet. Patient inquires if she can now get the 2024 COVID shot or if she should just wait until the next one. Patient inquires if the COVID shot will interfere with the Actemra . Please advise.

## 2023-10-23 NOTE — Telephone Encounter (Signed)
 Patient advised Ok to proceed with Covid-19 vaccine and also recommend high-dose influenza vaccine this fall. No interruption in therapy needed. Patient verbalized understanding.

## 2023-10-24 ENCOUNTER — Encounter: Payer: Self-pay | Admitting: Rheumatology

## 2023-10-24 ENCOUNTER — Telehealth: Payer: Self-pay

## 2023-10-24 DIAGNOSIS — Z79899 Other long term (current) drug therapy: Secondary | ICD-10-CM | POA: Insufficient documentation

## 2023-10-24 DIAGNOSIS — Z111 Encounter for screening for respiratory tuberculosis: Secondary | ICD-10-CM | POA: Insufficient documentation

## 2023-10-24 NOTE — Telephone Encounter (Addendum)
 IV Actemra  does not require pre-certification. Patient has Medicare Part B that will cover 80% of cost with 20% coinsurance that is patient's responsibility. This 20% will be covered by her [employer] after Part B deductible is met.  Referral to [facility] placed  Therapy plan placed for Actemra  IV [phone]) to start benefits investigation  Diagnosis: RA  Provider: Dr. Maya Nash and Waddell Craze, PA-C  Dose: 4mg /kg every 4 weeks Premedications: acetaminophen  650mg  p.o. and diphenhydramine  25mg  p.o.  Last infusion: 12/26/2022 Last Clinic Visit: 10/23/2023 Next Clinic Visit: 01/27/2024  Pertinent Labs:  CBC/CMP Due with first infusion; TB gold negative 07/23/2023  Sherry Pennant, PharmD, MPH, BCPS, CPP Clinical Pharmacist (Rheumatology and Pulmonology)  ----- Message from [facility] sent at 10/23/2023  4:11 PM EDT ----- Regarding: Actemra  dose change Per Waddell, patient's dose of Actemra  will be reduced to 4 mg/kg. Per Waddell, please place new orders for the Actemra . Please refer to office visit note for reduced dose if needed.

## 2023-10-28 ENCOUNTER — Other Ambulatory Visit: Payer: Self-pay | Admitting: Pharmacist

## 2023-10-28 ENCOUNTER — Telehealth: Payer: Self-pay | Admitting: Rheumatology

## 2023-10-28 DIAGNOSIS — Z79899 Other long term (current) drug therapy: Secondary | ICD-10-CM

## 2023-10-28 DIAGNOSIS — Z7952 Long term (current) use of systemic steroids: Secondary | ICD-10-CM

## 2023-10-28 DIAGNOSIS — M0579 Rheumatoid arthritis with rheumatoid factor of multiple sites without organ or systems involvement: Secondary | ICD-10-CM

## 2023-10-28 NOTE — Telephone Encounter (Signed)
 Pt needs her referral for her infusions at the hospital and not the location on [address]. Pt stated they do not have wheelchairs for their patients at that location.

## 2023-10-28 NOTE — Progress Notes (Signed)
 IV Actemra  does not require pre-certification. Patient has Medicare Part B that will cover 80% of cost with 20% coinsurance that is patient's responsibility. This 20% will be covered by her [employer] after Part B deductible is met.  Orders placed for [facility]  Therapy plan placed for Actemra  IV [phone]) to start benefits investigation  Diagnosis: RA  Provider: Dr. Maya Nash and Waddell Craze, PA-C  Dose: 4mg /kg every 4 weeks Premedications: acetaminophen  650mg  p.o. and diphenhydramine  25mg  p.o.  Last infusion: 12/26/2022 Last Clinic Visit: 10/23/2023 Next Clinic Visit: 01/27/2024  Pertinent Labs:  CBC/CMP Due with first infusion; TB gold negative 07/23/2023  Called patient and provided with phone number for: [facility] [phone]) - Ruthellen Sherry Pennant, PharmD, MPH, BCPS, CPP Clinical Pharmacist (Rheumatology and Pulmonology)  ----- Message from [facility] sent at 10/23/2023  4:11 PM EDT ----- Regarding: Actemra  dose change Per Waddell, patient's dose of Actemra  will be reduced to 4 mg/kg. Per Waddell, please place new orders for the Actemra . Please refer to office visit note for reduced dose if needed.

## 2023-10-28 NOTE — Telephone Encounter (Signed)
 Called patient and provided with phone number for: [name] [phone]) [facility]  Patient will call to schedule and is interested in switching to [place] once open  [name], PharmD, MPH, BCPS, CPP Clinical Pharmacist (Rheumatology and Pulmonology)

## 2023-10-30 ENCOUNTER — Telehealth (HOSPITAL_COMMUNITY): Payer: Self-pay | Admitting: Pharmacy Technician

## 2023-10-30 NOTE — Telephone Encounter (Addendum)
 Auth Submission: NO AUTH NEEDED Site of care: [facility] Payer: Medicare A/B, BC[employer] Supp   Medication & CPT/J Code(s) submitted: Actemra  (Tocilizumab ) G6737) Diagnosis Code: M05.79 Route of submission (phone, fax, portal):  Phone # Fax # Auth type: Buy/Bill HB Units/visits requested: 4mg /kg q 28 days Reference number:  Approval from: 10/30/23 to 03/27/24   Medicare A/B will cover 80%, BCBS Supp will cover remaining 20%. Med will be covered at 100%.   Patient lives in [place] and will transfer care to [place] [facility] once open.   Lilo Wallington, CPhT [facility] Phone: [phone] 10/30/2023

## 2023-10-31 NOTE — Progress Notes (Signed)
 Actemra  infusion scheduled for 11/04/2023

## 2023-11-04 ENCOUNTER — Ambulatory Visit (HOSPITAL_COMMUNITY)
Admission: RE | Admit: 2023-11-04 | Discharge: 2023-11-04 | Disposition: A | Discharge status: Home / Self Care | Source: Ambulatory Visit | Attending: Rheumatology | Admitting: Rheumatology

## 2023-11-04 ENCOUNTER — Inpatient Hospital Stay (HOSPITAL_COMMUNITY): Admission: RE | Admit: 2023-11-04 | Source: Ambulatory Visit

## 2023-11-04 VITALS — BP 116/48 | HR 56 | Temp 97.9°F | Resp 16 | Wt 195.0 lb

## 2023-11-04 DIAGNOSIS — M069 Rheumatoid arthritis, unspecified: Secondary | ICD-10-CM | POA: Diagnosis present

## 2023-11-04 MED ORDER — DIPHENHYDRAMINE HCL 25 MG PO CAPS: 25.0000 mg | ORAL_CAPSULE | Freq: Once | ORAL | Status: DC

## 2023-11-04 MED ORDER — ACETAMINOPHEN 325 MG PO TABS: 650.0000 mg | ORAL_TABLET | Freq: Once | ORAL | Status: DC

## 2023-11-04 MED ORDER — TOCILIZUMAB 400 MG/20ML IV SOLN
4.0000 mg/kg | Freq: Once | INTRAVENOUS | Status: AC
  Administered 2023-11-04: 354 mg via INTRAVENOUS
  Filled 2023-11-04: qty 17.7

## 2023-11-12 DIAGNOSIS — E669 Obesity, unspecified: Secondary | ICD-10-CM | POA: Diagnosis not present

## 2023-11-12 DIAGNOSIS — N1832 Chronic kidney disease, stage 3b: Secondary | ICD-10-CM | POA: Diagnosis not present

## 2023-11-12 DIAGNOSIS — I129 Hypertensive chronic kidney disease with stage 1 through stage 4 chronic kidney disease, or unspecified chronic kidney disease: Secondary | ICD-10-CM | POA: Diagnosis not present

## 2023-11-12 DIAGNOSIS — E785 Hyperlipidemia, unspecified: Secondary | ICD-10-CM | POA: Diagnosis not present

## 2023-11-12 DIAGNOSIS — L8990 Pressure ulcer of unspecified site, unspecified stage: Secondary | ICD-10-CM | POA: Diagnosis not present

## 2023-11-12 DIAGNOSIS — M059 Rheumatoid arthritis with rheumatoid factor, unspecified: Secondary | ICD-10-CM | POA: Diagnosis not present

## 2023-11-12 DIAGNOSIS — D631 Anemia in chronic kidney disease: Secondary | ICD-10-CM | POA: Diagnosis not present

## 2023-11-18 DIAGNOSIS — Z23 Encounter for immunization: Secondary | ICD-10-CM | POA: Diagnosis not present

## 2023-11-26 DIAGNOSIS — D510 Vitamin B12 deficiency anemia due to intrinsic factor deficiency: Secondary | ICD-10-CM | POA: Diagnosis not present

## 2023-12-01 ENCOUNTER — Other Ambulatory Visit: Payer: Self-pay | Admitting: Physician Assistant

## 2023-12-01 NOTE — Telephone Encounter (Signed)
 Last Fill: 10/23/2023  Next Visit: 01/27/2024  Last Visit: 10/23/2023  DX:  Rheumatoid arthritis with rheumatoid factor of multiple sites without organ or systems involvement   Current Dose per office note on 10/23/2023: Plan to also reduce prednisone  from 5 mg to 4 mg daily once she has resumed IV Actemra . The plan will be for her to try to reduce prednisone  by 1 mg every month and then discontinue.   Spoke with patient and she is currently on prednisone  4mg  po every day and plans to reduce to prednisone  3mg  po every day.   Okay to refill prednisone ?

## 2023-12-02 ENCOUNTER — Encounter (HOSPITAL_COMMUNITY)

## 2023-12-02 ENCOUNTER — Inpatient Hospital Stay (HOSPITAL_COMMUNITY): Admission: RE | Admit: 2023-12-02 | Source: Ambulatory Visit

## 2023-12-02 DIAGNOSIS — M79672 Pain in left foot: Secondary | ICD-10-CM | POA: Diagnosis not present

## 2023-12-02 DIAGNOSIS — M2012 Hallux valgus (acquired), left foot: Secondary | ICD-10-CM | POA: Diagnosis not present

## 2023-12-02 DIAGNOSIS — M14672 Charcot's joint, left ankle and foot: Secondary | ICD-10-CM | POA: Diagnosis not present

## 2023-12-02 DIAGNOSIS — M25572 Pain in left ankle and joints of left foot: Secondary | ICD-10-CM | POA: Diagnosis not present

## 2023-12-02 DIAGNOSIS — I890 Lymphedema, not elsewhere classified: Secondary | ICD-10-CM | POA: Diagnosis not present

## 2023-12-04 ENCOUNTER — Ambulatory Visit (HOSPITAL_COMMUNITY)
Admission: RE | Admit: 2023-12-04 | Discharge: 2023-12-04 | Disposition: A | Discharge status: Home / Self Care | Source: Ambulatory Visit | Attending: Rheumatology | Admitting: Rheumatology

## 2023-12-04 VITALS — BP 111/57 | HR 56 | Temp 98.6°F | Resp 17 | Wt 195.0 lb

## 2023-12-04 DIAGNOSIS — M069 Rheumatoid arthritis, unspecified: Secondary | ICD-10-CM | POA: Diagnosis present

## 2023-12-04 MED ORDER — TOCILIZUMAB 400 MG/20ML IV SOLN
4.0000 mg/kg | Freq: Once | INTRAVENOUS | Status: AC
  Administered 2023-12-04: 354 mg via INTRAVENOUS
  Filled 2023-12-04 (×2): qty 17.7

## 2023-12-04 MED ORDER — DIPHENHYDRAMINE HCL 25 MG PO CAPS: 25.0000 mg | ORAL_CAPSULE | Freq: Once | ORAL | Status: DC

## 2023-12-04 MED ORDER — ACETAMINOPHEN 325 MG PO TABS: 650.0000 mg | ORAL_TABLET | Freq: Once | ORAL | Status: DC

## 2023-12-08 ENCOUNTER — Telehealth: Payer: Self-pay | Admitting: Physician Assistant

## 2023-12-08 NOTE — Telephone Encounter (Signed)
 Patient reports intermittent lightheadedness for approximately 2 weeks. Patient denies any new medication changes, denies symptoms of being sick. Patient believes she is drinking enough fluids and she reports she is sticking to a diet as advised by a nutritionist. Patient is currently taking prednisone  3mg  daily and reports that she has been on prednisone  for over a year. Patient is questioning if the lightheadedness could be related to prednisone ?   Patient states that her PCP advised her to take lisinopril every other day- first adjustment since losing weight, this adjustment was made last Wednesday.  Patient states today is the first day in 2 weeks that she has not experienced the dizziness and now believes it could be the BP meds.

## 2023-12-08 NOTE — Telephone Encounter (Signed)
 Advised patient that per Waddell Craze, PA-C:   Prednisone  can occasionally cause lightheadedness but it would be unlikely since she did not experience lightheadedness at the higher doses of prednisone .  She is currently in the process of titrating the dose of prednisone  by 1 mg every month.  She can try further  reducing the dose of prednisone  to 2 mg daily if she would like to.    Patient verbalized understanding and will think about possibly decreasing to prednisone  2mg  po every day

## 2023-12-08 NOTE — Telephone Encounter (Signed)
 Pt called wanting to know if there is a symptom of prednisone  would cause her to feel light headed. Patient states it does not last long. Pt would like a call back.

## 2023-12-08 NOTE — Telephone Encounter (Signed)
 Prednisone  can occasionally cause lightheadedness but it would be unlikely since she did not experience lightheadedness at the higher doses of prednisone .  She is currently in the process of titrating the dose of prednisone  by 1 mg every month.  She can try further  reducing the dose of prednisone  to 2 mg daily if she would like to.

## 2023-12-16 DIAGNOSIS — Z23 Encounter for immunization: Secondary | ICD-10-CM | POA: Diagnosis not present

## 2023-12-23 DIAGNOSIS — H5203 Hypermetropia, bilateral: Secondary | ICD-10-CM | POA: Diagnosis not present

## 2023-12-23 DIAGNOSIS — H25813 Combined forms of age-related cataract, bilateral: Secondary | ICD-10-CM | POA: Diagnosis not present

## 2023-12-23 DIAGNOSIS — H52223 Regular astigmatism, bilateral: Secondary | ICD-10-CM | POA: Diagnosis not present

## 2023-12-23 DIAGNOSIS — H524 Presbyopia: Secondary | ICD-10-CM | POA: Diagnosis not present

## 2023-12-23 DIAGNOSIS — H43813 Vitreous degeneration, bilateral: Secondary | ICD-10-CM | POA: Diagnosis not present

## 2023-12-30 ENCOUNTER — Other Ambulatory Visit: Payer: Self-pay | Admitting: Physician Assistant

## 2023-12-30 NOTE — Telephone Encounter (Signed)
 Last Fill: 12/01/2023  Next Visit: 01/27/2024  Last Visit: 10/23/2023  Dx: Rheumatoid arthritis with rheumatoid factor of multiple sites without organ or systems involvement   Current Dose per office note on 10/23/2023: Plan to also reduce prednisone  from 5 mg to 4 mg daily once she has resumed IV Actemra . The plan will be for her to try to reduce prednisone  by 1 mg every month and then discontinue.   Spoke with patient and she is currently on Prednisone  3 mg po daily through the end of the week. Patient will taper to Prednisone  2 mg daily.  Okay to refill Prednisone ?

## 2023-12-31 DIAGNOSIS — D510 Vitamin B12 deficiency anemia due to intrinsic factor deficiency: Secondary | ICD-10-CM | POA: Diagnosis not present

## 2024-01-01 ENCOUNTER — Ambulatory Visit (HOSPITAL_COMMUNITY)
Admission: RE | Admit: 2024-01-01 | Discharge: 2024-01-01 | Disposition: A | Discharge status: Home / Self Care | Source: Ambulatory Visit | Attending: Rheumatology | Admitting: Rheumatology

## 2024-01-01 VITALS — BP 109/60 | HR 59 | Temp 97.5°F | Resp 16 | Wt 195.0 lb

## 2024-01-01 DIAGNOSIS — M069 Rheumatoid arthritis, unspecified: Secondary | ICD-10-CM | POA: Insufficient documentation

## 2024-01-01 LAB — COMPREHENSIVE METABOLIC PANEL WITH GFR
ALT: 23 U/L (ref 0–44)
AST: 21 U/L (ref 15–41)
Albumin: 3.3 g/dL — ABNORMAL LOW (ref 3.5–5.0)
Alkaline Phosphatase: 57 U/L (ref 38–126)
Anion gap: 10 (ref 5–15)
BUN: 26 mg/dL — ABNORMAL HIGH (ref 8–23)
CO2: 23 mmol/L (ref 22–32)
Calcium: 9.2 mg/dL (ref 8.9–10.3)
Chloride: 104 mmol/L (ref 98–111)
Creatinine, Ser: 0.97 mg/dL (ref 0.44–1.00)
GFR, Estimated: >60 mL/min (ref >60)
Glucose, Bld: 109 mg/dL — ABNORMAL HIGH (ref 70–99)
Potassium: 4.2 mmol/L (ref 3.5–5.1)
Sodium: 137 mmol/L (ref 135–145)
Total Bilirubin: 1.5 mg/dL — ABNORMAL HIGH (ref 0.0–1.2)
Total Protein: 6.5 g/dL (ref 6.5–8.1)

## 2024-01-01 LAB — CBC WITH DIFFERENTIAL/PLATELET
Abs Immature Granulocytes: 0.04 K/uL (ref 0.00–0.07)
Basophils Absolute: 0 K/uL (ref 0.0–0.1)
Basophils Relative: 0 %
Eosinophils Absolute: 0.1 K/uL (ref 0.0–0.5)
Eosinophils Relative: 1 %
HCT: 38.5 % (ref 36.0–46.0)
Hemoglobin: 12.9 g/dL (ref 12.0–15.0)
Immature Granulocytes: 0 %
Lymphocytes Relative: 8 %
Lymphs Abs: 0.9 K/uL (ref 0.7–4.0)
MCH: 31.3 pg (ref 26.0–34.0)
MCHC: 33.5 g/dL (ref 30.0–36.0)
MCV: 93.4 fL (ref 80.0–100.0)
Monocytes Absolute: 0.8 K/uL (ref 0.1–1.0)
Monocytes Relative: 7 %
Neutro Abs: 9.2 K/uL — ABNORMAL HIGH (ref 1.7–7.7)
Neutrophils Relative %: 84 %
Platelets: 287 K/uL (ref 150–400)
RBC: 4.12 MIL/uL (ref 3.87–5.11)
RDW: 13.7 % (ref 11.5–15.5)
WBC: 11 K/uL — ABNORMAL HIGH (ref 4.0–10.5)
nRBC: 0 % (ref 0.0–0.2)

## 2024-01-01 MED ORDER — TOCILIZUMAB 400 MG/20ML IV SOLN
4.0000 mg/kg | Freq: Once | INTRAVENOUS | Status: AC
  Administered 2024-01-01: 354 mg via INTRAVENOUS
  Filled 2024-01-01: qty 17.7

## 2024-01-01 MED ORDER — ACETAMINOPHEN 325 MG PO TABS: 650.0000 mg | ORAL_TABLET | Freq: Once | ORAL | Status: DC

## 2024-01-01 MED ORDER — DIPHENHYDRAMINE HCL 25 MG PO CAPS: 25.0000 mg | ORAL_CAPSULE | Freq: Once | ORAL | Status: DC

## 2024-01-02 ENCOUNTER — Ambulatory Visit: Payer: Self-pay | Admitting: Rheumatology

## 2024-01-02 NOTE — Progress Notes (Signed)
 CBC and CMP are stable.  No change in treatment advised.  Please forward results to her PCP.

## 2024-01-05 DIAGNOSIS — Z9181 History of falling: Secondary | ICD-10-CM | POA: Diagnosis not present

## 2024-01-05 DIAGNOSIS — Z0000 Encounter for general adult medical examination without abnormal findings: Secondary | ICD-10-CM | POA: Diagnosis not present

## 2024-01-12 DIAGNOSIS — E785 Hyperlipidemia, unspecified: Secondary | ICD-10-CM | POA: Diagnosis not present

## 2024-01-12 DIAGNOSIS — R739 Hyperglycemia, unspecified: Secondary | ICD-10-CM | POA: Diagnosis not present

## 2024-01-13 NOTE — Progress Notes (Unsigned)
 Office Visit Note  Patient: [name] [name]             Date of Birth: 07/04/1950           MRN: [id number]             PCP: Erick Greig LABOR, NP Referring: Erick Greig LABOR, NP Visit Date: 01/27/2024 Occupation: Data Unavailable  Subjective:  Medication monitoring   History of Present Illness: [name] [name] is a 73 y.o. female with history of seropositive rheumatoid arthritis.  Patient has resumed IV actemra  4 mg/kg infusions every 4 weeks.  She is tolerating Actemra  without any side effects.  She has been able to reduce prednisone  to 2 mg daily as of 01/07/2024 and plans on continuing to reduce by 1 mg every month.  Patient states that she has noticed a 90% improvement in her symptoms since reinitiating Actemra .  Patient reports that she has been able to be more functional and has had less difficulty performing ADLs.  Patient states her energy level has also improved.  Patient is scheduled for her next dose of Actemra  on 01/29/2024.  Patient is scheduled to undergo left ankle reconstructive surgery on 05/06/2023 by Dr. Clause.  She is aware that she will need to hold Actemra  prior to the surgery.  She denies any recent or recurrent infections.     Activities of Daily Living:  Patient reports morning stiffness for 0 minute.   Patient Denies nocturnal pain.  Difficulty dressing/grooming: Reports Difficulty climbing stairs: Reports Difficulty getting out of chair: Reports Difficulty using hands for taps, buttons, cutlery, and/or writing: Reports  Review of Systems  Constitutional:  Negative for fatigue.  HENT:  Positive for mouth sores and mouth dryness.   Eyes:  Positive for dryness.  Respiratory:  Negative for shortness of breath.   Cardiovascular:  Negative for chest pain and palpitations.  Gastrointestinal:  Negative for blood in stool, constipation and diarrhea.  Endocrine: Negative for increased urination.  Genitourinary:  Negative for involuntary urination.  Musculoskeletal:  Positive  for joint pain, gait problem, joint pain, myalgias, muscle weakness and myalgias. Negative for joint swelling, morning stiffness and muscle tenderness.  Skin:  Negative for color change, rash, hair loss and sensitivity to sunlight.  Allergic/Immunologic: Negative for susceptible to infections.  Neurological:  Negative for dizziness and headaches.  Hematological:  Negative for swollen glands.  Psychiatric/Behavioral:  Positive for sleep disturbance. Negative for depressed mood. The patient is not nervous/anxious.     PMFS History:  Patient Active Problem List   Diagnosis Date Noted   High risk medication use 10/24/2023   Screening for tuberculosis 10/24/2023   Primary osteoarthritis of both knees 04/17/2022   History of hyperlipidemia 04/17/2022   Stage 3a chronic kidney disease (HCC) 04/17/2022   Dyspnea 11/05/2013   Rheumatoid arthritis (HCC)    Hypertension    GERD (gastroesophageal reflux disease)    Morbid obesity (HCC) 03/10/2013   S/P right THA, AA 03/09/2013    Past Medical History:  Diagnosis Date   Deviated nasal septum    right side, cannot breathe out of right septum   Episcleritis periodica fugax of both eyes    GERD (gastroesophageal reflux disease)    Hypertension    Kidney disease    Pinguecula of left eye    Plantar fasciitis    Pressure sore    Left foot   Rheumatoid arthritis (HCC)    oa and ra in arms, shane anderson   Spondylitis  Stasis dermatitis    UTI (lower urinary tract infection) 03/02/2013   treated with keflex for hip surgery on 03/09/13    Family History  Problem Relation Age of Onset   Hypertension Mother    Alzheimer's disease Mother    Heart disease Father    Kidney disease Father    Heart attack Father    Heart disease Brother    Cancer Brother        agent orange    Heart attack Brother    Vascular Disease Brother    Kidney disease Brother        stage 4   Rheum arthritis Maternal Aunt    Past Surgical History:  Procedure  Laterality Date   BIOPSY BREAST Left    [facility]   BREAST BIOPSY Right 11/16/2020   COLON BIOPSY  04/2020   COLONOSCOPY N/A 04/21/2015   DIAGNOSTIC MAMMOGRAM Bilateral 04/05/2016   ingrown toenail removed  5-6 yrs ago   toenail removal Left    TOOTH EXTRACTION  10/08/2022   TOTAL HIP ARTHROPLASTY Right 03/09/2013   Procedure: RIGHT TOTAL HIP ARTHROPLASTY ANTERIOR APPROACH;  Surgeon: Donnice JONETTA Car, MD;  Location: [facility];  Service: Orthopedics;  Laterality: Right;   Social History   Tobacco Use   Smoking status: Former    Current packs/day: 0.00    Average packs/day: 1.5 packs/day for 20.0 years (30.0 ttl pk-yrs)    Types: Cigarettes    Start date: 02/26/1968    Quit date: 02/26/1988    Years since quitting: 35.9    Passive exposure: Past   Smokeless tobacco: Never  Vaping Use   Vaping status: Never Used  Substance Use Topics   Alcohol use: Not Currently   Drug use: Not Currently    Types: Marijuana    Comment: marijuana none since college   Social History   Social History Narrative   Not on file     Immunization History  Administered Date(s) Administered    sv, Bivalent, Protein Subunit Rsvpref,pf (Abrysvo) 11/13/2021   Influenza Split 12/26/2012   Influenza-Unspecified 11/29/2013, 11/07/2020, 11/13/2021, 11/14/2022   Moderna Covid-19 Vaccine  Bivalent Booster 29yrs & up 06/29/2021, 12/12/2021   Moderna Sars-Covid-2 Vaccination 04/01/2019, 04/27/2019, 10/12/2019, 06/22/2020, 11/07/2020   Pneumococcal Polysaccharide-23 03/11/2013   Zoster Recombinant(Shingrix) 07/08/2017, 12/30/2017     Objective: Vital Signs: BP 103/66   Pulse 61   Temp 97.8 F (36.6 C)   Resp 16   Ht 5' 1 (1.549 m)   Wt 192 lb 3.2 oz (87.2 kg)   BMI 36.32 kg/m    Physical Exam Vitals and nursing note reviewed.  Constitutional:      Appearance: She is well-developed.  HENT:     Head: Normocephalic and atraumatic.  Eyes:     Conjunctiva/sclera: Conjunctivae normal.   Cardiovascular:     Rate and Rhythm: Normal rate and regular rhythm.     Heart sounds: Normal heart sounds.  Pulmonary:     Effort: Pulmonary effort is normal.     Breath sounds: Normal breath sounds.  Abdominal:     General: Bowel sounds are normal.     Palpations: Abdomen is soft.  Musculoskeletal:     Cervical back: Normal range of motion.  Lymphadenopathy:     Cervical: No cervical adenopathy.  Skin:    General: Skin is warm and dry.     Capillary Refill: Capillary refill takes less than 2 seconds.  Neurological:     Mental Status: She is alert and  oriented to person, place, and time.  Psychiatric:        Behavior: Behavior normal.      Musculoskeletal Exam: Patient remained in wheelchair during examination today.  C-spine has good range of motion.  Thoracic kyphosis noted.  Limited extension of both elbows.  Synovial thickening of both wrist joints.  Inflammation noted in the left fifth PIP joint.  No tenderness or synovitis of MCP joints.  Hip joints able to assess in seated position.  Knee joints have good range of motion no warmth or effusion.  Lymphedema noted bilateral lower extremities.  CDAI Exam: CDAI Score: -- Patient Global: --; Provider Global: -- Swollen: --; Tender: -- Joint Exam 01/27/2024   No joint exam has been documented for this visit   There is currently no information documented on the homunculus. Go to the Rheumatology activity and complete the homunculus joint exam.  Investigation: No additional findings.  Imaging: No results found.  Recent Labs: Lab Results  Component Value Date   WBC 11.0 (H) 01/01/2024   HGB 12.9 01/01/2024   PLT 287 01/01/2024   NA 137 01/01/2024   K 4.2 01/01/2024   CL 104 01/01/2024   CO2 23 01/01/2024   GLUCOSE 109 (H) 01/01/2024   BUN 26 (H) 01/01/2024   CREATININE 0.97 01/01/2024   BILITOT 1.5 (H) 01/01/2024   ALKPHOS 57 01/01/2024   AST 21 01/01/2024   ALT 23 01/01/2024   PROT 6.5 01/01/2024   ALBUMIN  3.3 (L) 01/01/2024   CALCIUM 9.2 01/01/2024   GFRAA 48 (L) 10/28/2019   QFTBGOLDPLUS INDETERMINATE (A) 07/23/2023    Speciality Comments: HCQ-SOB, MTX-SOB, Arava-SOB, Humira-SOB, forgetfullness, Enbrel-Painful injection Actemra -03/2014  Procedures:  No procedures performed Allergies: Allopurinol, Asmanex  (120 metered doses) [mometasone  furoate], Finasteride, Guaifenesin & derivatives, Methotrexate and trimetrexate, Methylprednisolone, Monosodium glutamate, Plaquenil  [hydroxychloroquine  sulfate], Prednisone , Prevnar 13 [pneumococcal 13-val conj vacc], Shingrix [zoster vac recomb adjuvanted], Tetanus toxoid-containing vaccines, Adalimumab, Bacitracin-polymyxin b, Baclofen, Ciprofloxacin, Epiceram, Leflunomide, Other, Rosuvastatin, Keratin, Neomycin-bacitracin zn-polymyx, Sulfa antibiotics, and Vitamin e    Assessment / Plan:     Visit Diagnoses: Rheumatoid arthritis with rheumatoid factor of multiple sites without organ or systems involvement (HCC) - Dx 20 years ago, Followed by Dr. Leni. 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 ESR 18, CRP<1: She has noticed over 90% improvement in her symptoms since reinitiating IV Actemra .  She is currently on the reduced dose of IV Actemra  4 mg/kg infusions every 28 days due to history of a nonhealing ulcer on the left foot-healed and cleared by wound care.  She resumed Actemra  on 11/04/2023 and has been tolerating it without any side effects.  No recurrence of an ulcer or any other recurrent infections. She has been able to reduce prednisone  to 2 mg daily as of 01/07/2024-she plans to continue to reduce by 1 mg every month until she has discontinued.  She has not noticed any new or worsening symptoms as she has been reducing the dose of prednisone .  She has been able to be more functional and mobile.  She has had less difficulty performing ADLs since reinitiating Actemra .  No medication changes will be made at this time.  She was advised to notify us  if she develops any signs or  symptoms of a flare.  She will follow up in 5 months or sooner if needed.   Current chronic use of systemic steroids - Current therapy: Prednisone  2 mg 1 tablet by mouth daily.  Tapering by 1 mg every month.  High risk medication use -  Actemra  infusion 4 mg/kg every 28 days-restarted 11/04/23.  Prednisone  2 mg daily-tapering by 1 mg every month.  CBC and CMP updated 01/01/24.  She will continue to have lab work with infusions.  TB gold negative 07/23/23 Hgb A1c updated on 10/11/23 Lipid panel updated on 10/11/23 No recent or recurrent infections. Discussed the importance of holding actemra  if she develops signs or symptoms of an infection and to resume once the infection has completely cleared.   Patient is up to date with flu vaccine 12/16/23 and Covid 11/18/23.   Elevated serum creatinine - Dr. Ruthe was 0.97 and GFR was greater than 60 on 01/01/2024.  Positive TB test - TB Gold negative on 08/18/2023.  Status post total hip replacement, right: Doing well.    Pain in left ankle and joints of left foot - Evaluated by Dr. Lamount 04/15/23.  Undergoing surgical intervention on 05/05/24 by Dr. Myra.  Patient is aware that she will need to hold IV Actemra  prior to surgical intervention.  She will require clearance by Dr. Myra to resume IV Actemra  during the postoperative period.  Primary osteoarthritis of both knees: No warmth or effusion  Pain in thoracic spine: Thoracic kyphosis noted.  Chronic bilateral low back pain without sciatica: No symptoms of sciatica at this time.   Other medical conditions are listed as follows:  Essential hypertension: Blood pressure was 103/66 today in the office.  History of hyperlipidemia  Stage 3a chronic kidney disease (HCC)  Family history of rheumatoid arthritis  Former smoker  History of anemia  Pulmonary nodules  History of gastroesophageal reflux (GERD)  Orders: No orders of the defined types were placed in this encounter.  No  orders of the defined types were placed in this encounter.    Follow-Up Instructions: Return in about 5 months (around 06/26/2024).   Waddell CHRISTELLA Craze, PA-C  Note - This record has been created using Dragon software.  Chart creation errors have been sought, but may not always  have been located. Such creation errors do not reflect on  the standard of medical care.

## 2024-01-26 ENCOUNTER — Encounter: Payer: Self-pay | Admitting: Allergy and Immunology

## 2024-01-26 ENCOUNTER — Ambulatory Visit: Admitting: Allergy and Immunology

## 2024-01-26 VITALS — BP 120/62 | HR 60 | Resp 16

## 2024-01-26 DIAGNOSIS — J4540 Moderate persistent asthma, uncomplicated: Secondary | ICD-10-CM

## 2024-01-26 DIAGNOSIS — J3089 Other allergic rhinitis: Secondary | ICD-10-CM | POA: Diagnosis not present

## 2024-01-26 DIAGNOSIS — M069 Rheumatoid arthritis, unspecified: Secondary | ICD-10-CM

## 2024-01-26 NOTE — Progress Notes (Unsigned)
 [name] [name] - [place] - [place] - [place] - [place]   Follow-up Note  Referring Provider: Erick [name] LABOR, NP Primary Provider: Erick [name] LABOR, NP Date of Office Visit: 01/26/2024  Subjective:   [name] [name] (DOB: 07/27/1950) is a 73 y.o. female who [facility] in re-evaluation of the following:  HPI: [name] [name] returns to this clinic in evaluation of asthma and allergic rhinitis.  I last saw her in this clinic 12 August 2022.  Overall she is doing very well with her upper and lower airway without the need for systemic steroid or an antibiotic for any type of airway issue and rare use of a short acting bronchodilator and no significant problems with her upper airway while using Flonase  and Arnuity approximately 3 times per week.  She did have ligament damage to her left leg and she will be having surgery in March 2026 at [facility] for this issue.  Currently she is using a wheelchair to move around.  She tells me that she has lost 90 pounds of weight in the past year voluntarily.  She still continues on prednisone  but there is a tapering plan and apparently in January 2026 she will be off her prednisone .  Allergies as of 01/26/2024       Reactions   Allopurinol Shortness Of Breath   Asmanex  (120 Metered Doses) [mometasone  Furoate] Shortness Of Breath   Finasteride Shortness Of Breath   Guaifenesin & Derivatives Shortness Of Breath   MUCINEX   Methotrexate And Trimetrexate Shortness Of Breath   Methylprednisolone Shortness Of Breath   Monosodium Glutamate Shortness Of Breath   Plaquenil  [hydroxychloroquine  Sulfate] Shortness Of Breath   Prednisone  Shortness Of Breath   Prevnar 13 [pneumococcal 13-val Conj Vacc] Shortness Of Breath   Shingrix [zoster Vac Recomb Adjuvanted] Shortness Of Breath   Tetanus Toxoid-containing Vaccines Shortness Of Breath   Baclofen    Confusion, muscle weakness, forgetfulness, hallucinations, loss of  appetite, headaches    Ciprofloxacin Other (See Comments)   Breathing and swallowing after two doses of medications   Epiceram    Humira [adalimumab]    SOB. Tolerates Actemra    Keratin    Keratin oil   Leflunomide    SOB   Other    AQUAPHOR   Polysporin [bacitracin-polymyxin B]    Rosuvastatin    Vitamin E    Vitamin e oil   Neosporin [neomycin-bacitracin Zn-polymyx] Rash   Sulfa Antibiotics Itching, Rash        Medication List    acetaminophen  500 MG tablet Commonly known as: TYLENOL  Take 500 mg by mouth as needed.   ACTEMRA  IV Inject 4 mg/kg into the vein every 28 (twenty-eight) days. At [facility]   albuterol  108 (90 Base) MCG/ACT inhaler Commonly known as: VENTOLIN  HFA INHALE 2 PUFFS EVERY 4 TO 6 HOURS AS NEEDED FOR COUGH OR WHEEZE   Arnuity Ellipta  200 MCG/ACT Aepb Generic drug: Fluticasone  Furoate INHALE ONE PUFF ONCE DAILY. RINSE MOUTH AFTER USE.   atenolol  50 MG tablet Commonly known as: TENORMIN  Take 50 mg by mouth every evening.   B-12 COMPLIANCE INJECTION IJ Inject 1 Dose as directed every 30 (thirty) days.   BIOFREEZE EX Apply topically.   cephALEXin 500 MG capsule Commonly known as: KEFLEX Take 500 mg by mouth 4 (four) times daily. 1 hour prior to procedure (dental)   ezetimibe 10 MG tablet Commonly known as: ZETIA Take 10 mg by mouth daily.  fluticasone  50 MCG/ACT nasal spray Commonly known as: FLONASE  Place 1 spray into both nostrils daily.   GINGER PO Take 550 mg by mouth every morning.   L-Lysine 500 MG Caps Take by mouth daily as needed.   levocetirizine 5 MG tablet Commonly known as: XYZAL Take 5 mg by mouth daily as needed for allergies.   lidocaine  4 % external solution Commonly known as: XYLOCAINE  Apply topically as needed.   lisinopril-hydrochlorothiazide 20-12.5 MG tablet Commonly known as: ZESTORETIC Take 1 tablet by mouth every morning.   multivitamin with minerals Tabs tablet Take 1 tablet by  mouth daily.   predniSONE  1 MG tablet Commonly known as: DELTASONE  Take 2 tablets (2 mg total) by mouth daily with breakfast. What changed: Another medication with the same name was removed. Continue taking this medication, and follow the directions you see here.   Red Yeast Rice 600 MG Caps Take 600 mg by mouth 2 (two) times daily.   UYZMJTNMK RELIEF EX Apply topically.   Vascepa 1 g capsule Generic drug: icosapent Ethyl 2 (TWO) CAPSULE TWO TIMES DAILY   white petrolatum Gel Commonly known as: VASELINE Apply 1 application  topically as needed for lip care or dry skin.    Past Medical History:  Diagnosis Date   Deviated nasal septum    right side, cannot breathe out of right septum   Episcleritis periodica fugax of both eyes    GERD (gastroesophageal reflux disease)    Hypertension    Kidney disease    Pinguecula of left eye    Plantar fasciitis    Pressure sore    Left foot   Rheumatoid arthritis (HCC)    oa and ra in arms, [name] [name]   Spondylitis    Stasis dermatitis    UTI (lower urinary tract infection) 03/02/2013   treated with keflex for hip surgery on 03/09/13    Past Surgical History:  Procedure Laterality Date   BIOPSY BREAST Left    [facility]   BREAST BIOPSY Right 11/16/2020   COLON BIOPSY  04/2020   COLONOSCOPY N/A 04/21/2015   DIAGNOSTIC MAMMOGRAM Bilateral 04/05/2016   ingrown toenail removed  5-6 yrs ago   toenail removal Left    TOOTH EXTRACTION  10/08/2022   TOTAL HIP ARTHROPLASTY Right 03/09/2013   Procedure: RIGHT TOTAL HIP ARTHROPLASTY ANTERIOR APPROACH;  Surgeon: [name] JONETTA Car, MD;  Location: [facility];  Service: Orthopedics;  Laterality: Right;    Review of systems negative except as noted in HPI / PMHx or noted below:  Review of Systems  Constitutional: Negative.   HENT: Negative.    Eyes: Negative.   Respiratory: Negative.    Cardiovascular: Negative.   Gastrointestinal: Negative.   Genitourinary: Negative.    Musculoskeletal: Negative.   Skin: Negative.   Neurological: Negative.   Endo/Heme/Allergies: Negative.   Psychiatric/Behavioral: Negative.       Objective:   There were no vitals filed for this visit.        Physical Exam Constitutional:      Appearance: She is not diaphoretic.  HENT:     Head: Normocephalic.     Right Ear: Tympanic membrane, ear canal and external ear normal.     Left Ear: Tympanic membrane, ear canal and external ear normal.     Nose: Nose normal. No mucosal edema or rhinorrhea.     Mouth/Throat:     Pharynx: Uvula midline. No oropharyngeal exudate.  Eyes:     Conjunctiva/sclera: Conjunctivae normal.  Neck:  Thyroid : No thyromegaly.     Trachea: Trachea normal. No tracheal tenderness or tracheal deviation.  Cardiovascular:     Rate and Rhythm: Normal rate and regular rhythm.     Heart sounds: Normal heart sounds, S1 normal and S2 normal. No murmur heard. Pulmonary:     Effort: No respiratory distress.     Breath sounds: Normal breath sounds. No stridor. No wheezing or rales.  Lymphadenopathy:     Head:     Right side of head: No tonsillar adenopathy.     Left side of head: No tonsillar adenopathy.     Cervical: No cervical adenopathy.  Skin:    Findings: No erythema or rash.     Nails: There is no clubbing.  Neurological:     Mental Status: She is alert.     Diagnostics: Spirometry was performed.  Her FEV1 was 1.79 which was 95% of predicted.  Assessment and Plan:   1. Asthma, moderate persistent, well-controlled   2. Perennial allergic rhinitis     1.  Continue to Treat and prevent inflammation:   A.  Flonase  1 spray each nostril 3-7 times per week  B.  Arnuity 200 1 inhalation 3-7 times per week  2.  If needed:   A.  Albuterol  HFA 1-2 puffs every 4-6 hours if needed  B.  OTC antihistamine  3.  Return to clinic in 12 months or earlier if problem   4.  Influenza = Tamiflu. Covid = Paxlovid  Annlee has done very well on  her current plan of utilizing relatively low doses of anti-inflammatory agents for both her upper and lower airway.  She will continue on Flonase  and Arnuity currently at 3 times per week and I will see her back in this clinic in 1 year or earlier if there is a problem.  Camellia Denis, MD Allergy  / Immunology [facility]

## 2024-01-26 NOTE — Patient Instructions (Addendum)
  1.  Continue to Treat and prevent inflammation:   A.  Flonase  1 spray each nostril 3-7 times per week  B.  Arnuity 200 1 inhalation 3-7 times per week  2.  If needed:   A.  Albuterol  HFA 1-2 puffs every 4-6 hours if needed  B.  OTC antihistamine  3.  Return to clinic in 12 months or earlier if problem   4.  Influenza = Tamiflu. Covid = Paxlovid

## 2024-01-27 ENCOUNTER — Encounter: Payer: Self-pay | Admitting: Physician Assistant

## 2024-01-27 ENCOUNTER — Encounter: Payer: Self-pay | Admitting: Allergy and Immunology

## 2024-01-27 ENCOUNTER — Ambulatory Visit: Admitting: Physician Assistant

## 2024-01-27 VITALS — BP 103/66 | HR 61 | Temp 97.8°F | Resp 16 | Ht 61.0 in | Wt 192.2 lb

## 2024-01-27 DIAGNOSIS — Z8719 Personal history of other diseases of the digestive system: Secondary | ICD-10-CM | POA: Insufficient documentation

## 2024-01-27 DIAGNOSIS — Z8639 Personal history of other endocrine, nutritional and metabolic disease: Secondary | ICD-10-CM

## 2024-01-27 DIAGNOSIS — R7611 Nonspecific reaction to tuberculin skin test without active tuberculosis: Secondary | ICD-10-CM | POA: Diagnosis present

## 2024-01-27 DIAGNOSIS — G8929 Other chronic pain: Secondary | ICD-10-CM | POA: Diagnosis present

## 2024-01-27 DIAGNOSIS — Z7952 Long term (current) use of systemic steroids: Secondary | ICD-10-CM | POA: Diagnosis present

## 2024-01-27 DIAGNOSIS — M069 Rheumatoid arthritis, unspecified: Secondary | ICD-10-CM | POA: Insufficient documentation

## 2024-01-27 DIAGNOSIS — M545 Low back pain, unspecified: Secondary | ICD-10-CM | POA: Insufficient documentation

## 2024-01-27 DIAGNOSIS — M170 Bilateral primary osteoarthritis of knee: Secondary | ICD-10-CM

## 2024-01-27 DIAGNOSIS — R7989 Other specified abnormal findings of blood chemistry: Secondary | ICD-10-CM | POA: Diagnosis present

## 2024-01-27 DIAGNOSIS — M546 Pain in thoracic spine: Secondary | ICD-10-CM

## 2024-01-27 DIAGNOSIS — Z87891 Personal history of nicotine dependence: Secondary | ICD-10-CM | POA: Insufficient documentation

## 2024-01-27 DIAGNOSIS — R918 Other nonspecific abnormal finding of lung field: Secondary | ICD-10-CM | POA: Diagnosis present

## 2024-01-27 DIAGNOSIS — Z8261 Family history of arthritis: Secondary | ICD-10-CM | POA: Diagnosis present

## 2024-01-27 DIAGNOSIS — I10 Essential (primary) hypertension: Secondary | ICD-10-CM

## 2024-01-27 DIAGNOSIS — Z862 Personal history of diseases of the blood and blood-forming organs and certain disorders involving the immune mechanism: Secondary | ICD-10-CM

## 2024-01-27 DIAGNOSIS — Z79899 Other long term (current) drug therapy: Secondary | ICD-10-CM

## 2024-01-27 DIAGNOSIS — Z96641 Presence of right artificial hip joint: Secondary | ICD-10-CM | POA: Diagnosis present

## 2024-01-27 DIAGNOSIS — M25572 Pain in left ankle and joints of left foot: Secondary | ICD-10-CM | POA: Insufficient documentation

## 2024-01-27 DIAGNOSIS — M0579 Rheumatoid arthritis with rheumatoid factor of multiple sites without organ or systems involvement: Secondary | ICD-10-CM | POA: Diagnosis present

## 2024-01-27 DIAGNOSIS — N1831 Chronic kidney disease, stage 3a: Secondary | ICD-10-CM

## 2024-01-29 ENCOUNTER — Ambulatory Visit
Admission: RE | Admit: 2024-01-29 | Discharge: 2024-01-29 | Disposition: A | Discharge status: Home / Self Care | Source: Ambulatory Visit | Attending: Rheumatology | Admitting: Rheumatology

## 2024-01-29 VITALS — BP 114/58 | HR 63 | Temp 97.6°F | Resp 16 | Wt 192.0 lb

## 2024-01-29 DIAGNOSIS — M069 Rheumatoid arthritis, unspecified: Secondary | ICD-10-CM | POA: Diagnosis not present

## 2024-01-29 MED ORDER — ACETAMINOPHEN 325 MG PO TABS: 650.0000 mg | ORAL_TABLET | Freq: Once | ORAL | Status: DC

## 2024-01-29 MED ORDER — TOCILIZUMAB 400 MG/20ML IV SOLN
4.0000 mg/kg | Freq: Once | INTRAVENOUS | Status: AC
  Administered 2024-01-29: 348 mg via INTRAVENOUS
  Filled 2024-01-29: qty 17.4

## 2024-01-29 MED ORDER — DIPHENHYDRAMINE HCL 25 MG PO CAPS: 25.0000 mg | ORAL_CAPSULE | Freq: Once | ORAL | Status: DC

## 2024-02-02 ENCOUNTER — Other Ambulatory Visit: Payer: Self-pay | Admitting: Physician Assistant

## 2024-02-02 NOTE — Telephone Encounter (Addendum)
 Last Fill: 12/30/2023  Next Visit: 06/29/2024  Last Visit: 01/27/2024  Dx: : Rheumatoid arthritis with rheumatoid factor of multiple sites without organ or systems involvement (HCC)   Current Dose per office note on 01/27/2024: Prednisone  2 mg 1 tablet by mouth daily.  Tapering by 1 mg every month.   Prednisone  prescription changed to 1 mg 1 tablet by mouth daily.   Okay to refill Prednisone ?

## 2024-02-04 DIAGNOSIS — M8589 Other specified disorders of bone density and structure, multiple sites: Secondary | ICD-10-CM | POA: Diagnosis not present

## 2024-02-04 DIAGNOSIS — N959 Unspecified menopausal and perimenopausal disorder: Secondary | ICD-10-CM | POA: Diagnosis not present

## 2024-02-04 DIAGNOSIS — D510 Vitamin B12 deficiency anemia due to intrinsic factor deficiency: Secondary | ICD-10-CM | POA: Diagnosis not present

## 2024-02-04 LAB — HM DEXA SCAN

## 2024-02-17 ENCOUNTER — Telehealth: Payer: Self-pay

## 2024-02-17 NOTE — Telephone Encounter (Signed)
 Please clarify what type of infection the patient is currently on antibiotics for?  The infection will need to be completely resolved and she will need to have completed the course of antibiotics prior to resuming Actemra .

## 2024-02-17 NOTE — Telephone Encounter (Signed)
 Patient advised Please clarify what type of infection the patient is currently on antibiotics for? Patient states she has a sinus infection. The infection will need to be completely resolved and she will need to have completed the course of antibiotics prior to resuming Actemra . Patient verbalized understanding. Patient states she will call at the beginning of next week to let us  know how she is doing.

## 2024-02-17 NOTE — Telephone Encounter (Signed)
 Patient contacted the office and states she is on Cephalexin. Patient states she is taking Cephalexin 500 mg 3 times a day from 02/04/2024-02/20/2024. Patient states she is scheduled for her Actemra  infusion on 02/27/2024. Patient inquired if she is still okay to get that infusion on that day or if she needs to reschedule. Please advise.

## 2024-02-23 ENCOUNTER — Other Ambulatory Visit: Payer: Self-pay | Admitting: *Deleted

## 2024-02-23 MED ORDER — FLUTICASONE PROPIONATE 50 MCG/ACT NA SUSP: 1.0000 | Freq: Every day | NASAL | 1 refills | Status: AC

## 2024-02-23 MED ORDER — ARNUITY ELLIPTA 200 MCG/ACT IN AEPB: INHALATION_SPRAY | RESPIRATORY_TRACT | 1 refills | Status: AC

## 2024-02-27 ENCOUNTER — Ambulatory Visit (HOSPITAL_COMMUNITY)
Admission: RE | Admit: 2024-02-27 | Discharge: 2024-02-27 | Disposition: A | Discharge status: Home / Self Care | Source: Ambulatory Visit | Attending: Rheumatology | Admitting: Rheumatology

## 2024-02-27 VITALS — BP 113/49 | HR 57 | Temp 98.0°F | Resp 17 | Wt 192.0 lb

## 2024-02-27 DIAGNOSIS — M069 Rheumatoid arthritis, unspecified: Secondary | ICD-10-CM | POA: Diagnosis present

## 2024-02-27 MED ORDER — DIPHENHYDRAMINE HCL 25 MG PO CAPS: 25.0000 mg | ORAL_CAPSULE | Freq: Once | ORAL | Status: DC

## 2024-02-27 MED ORDER — TOCILIZUMAB 400 MG/20ML IV SOLN
4.0000 mg/kg | Freq: Once | INTRAVENOUS | Status: AC
  Administered 2024-02-27: 348 mg via INTRAVENOUS
  Filled 2024-02-27 (×2): qty 17.4

## 2024-02-27 MED ORDER — ACETAMINOPHEN 325 MG PO TABS: 650.0000 mg | ORAL_TABLET | Freq: Once | ORAL | Status: DC

## 2024-03-09 ENCOUNTER — Telehealth: Payer: Self-pay | Admitting: Pharmacist

## 2024-03-09 ENCOUNTER — Telehealth: Payer: Self-pay

## 2024-03-09 NOTE — Telephone Encounter (Signed)
 Reached out to patient regarding Actemra  infusions. She lives in [place] and is interested in transferring infusions there. Site of care changed. She is scheduled for 03/27/2023 but does not require auth with Medicare + supplement plan.  She is willing to delay infusions to first of Feb if needed for credentialing reasons. Scheduling team at infusion center notified.  Sherry Pennant, PharmD, MPH, BCPS, CPP Clinical Pharmacist

## 2024-03-09 NOTE — Telephone Encounter (Signed)
 Auth Submission: NO AUTH NEEDED Site of care: [facility] Payer: Medicare A/B with BCBS supplement Medication & CPT/J Code(s) submitted: Actemra  (Tocilizumab ) G6737) Diagnosis Code:  Route of submission (phone, fax, portal):  Phone # Fax # Auth type: Buy/Bill PB Units/visits requested: 4mg /kg x 12 doses Reference number:  Approval from: 03/09/24 to 03/27/25

## 2024-03-15 ENCOUNTER — Telehealth: Payer: Self-pay

## 2024-03-15 NOTE — Telephone Encounter (Signed)
 Received DEXA results from [facility].  Date of Scan: 02/04/2024  Lowest T-score: -1.0  Lowest site measured: left 1/3 radius  DX: osteopenia  Current Regimen: n/a  Recommendation: calcium, vitamin D  and resistive exercise.   Reviewed by: Dr. Dolphus  Next Appointment:  06/29/2024  Advised patient of recommendations and she verbalized understanding. Copy of DEXA sent to the scan center.

## 2024-03-25 MED ORDER — TOCILIZUMAB 400 MG/20ML IV SOLN
350.0000 mg | Freq: Once | INTRAVENOUS | Status: AC
  Administered 2024-03-26: 350 mg via INTRAVENOUS
  Filled 2024-03-25: qty 10

## 2024-03-26 ENCOUNTER — Telehealth: Payer: Self-pay | Admitting: Podiatry

## 2024-03-26 ENCOUNTER — Encounter (HOSPITAL_COMMUNITY)

## 2024-03-26 ENCOUNTER — Other Ambulatory Visit: Payer: Self-pay

## 2024-03-26 ENCOUNTER — Encounter: Attending: Internal Medicine | Admitting: *Deleted

## 2024-03-26 VITALS — BP 135/75 | HR 47 | Temp 97.5°F | Resp 18 | Ht 61.0 in | Wt 192.0 lb

## 2024-03-26 DIAGNOSIS — M069 Rheumatoid arthritis, unspecified: Secondary | ICD-10-CM | POA: Diagnosis present

## 2024-03-26 DIAGNOSIS — R799 Abnormal finding of blood chemistry, unspecified: Secondary | ICD-10-CM

## 2024-03-26 MED ORDER — ACETAMINOPHEN 325 MG PO TABS: 650.0000 mg | ORAL_TABLET | Freq: Once | ORAL | Status: DC

## 2024-03-26 MED ORDER — DIPHENHYDRAMINE HCL 25 MG PO CAPS: 25.0000 mg | ORAL_CAPSULE | Freq: Once | ORAL | Status: DC

## 2024-03-26 NOTE — Progress Notes (Signed)
 Diagnosis: Rheumatoid Arthritis  Provider:  Sherre Clapper MD  Procedure: IV Infusion  IV Type: Peripheral, IV Location: L Antecubital  Actemra  (Tocilizumab ), Dose: 350 mg  Infusion Start Time: 1307  Infusion Stop Time: 1415  Post Infusion IV Care: Patient declined observation and Peripheral IV Discontinued  Discharge: Condition: Good, Destination: Home . AVS Declined  Performed by:  Lakeya Mulka E, RN

## 2024-03-26 NOTE — Telephone Encounter (Signed)
 Patient called stating that she received a message via MyChart about labs that needs to be completed and she would like to know if the message was sent in error or if it's something that needs to be completed.

## 2024-03-27 LAB — CBC WITH DIFFERENTIAL/PLATELET
Basophils Absolute: 0 10*3/uL (ref 0.0–0.2)
Basos: 0 %
EOS (ABSOLUTE): 0.1 10*3/uL (ref 0.0–0.4)
Eos: 2 %
Hematocrit: 36.3 % (ref 34.0–46.6)
Hemoglobin: 12.1 g/dL (ref 11.1–15.9)
Immature Grans (Abs): 0.1 10*3/uL (ref 0.0–0.1)
Immature Granulocytes: 1 %
Lymphocytes Absolute: 1.3 10*3/uL (ref 0.7–3.1)
Lymphs: 15 %
MCH: 33.2 pg — ABNORMAL HIGH (ref 26.6–33.0)
MCHC: 33.3 g/dL (ref 31.5–35.7)
MCV: 100 fL — ABNORMAL HIGH (ref 79–97)
Monocytes Absolute: 0.7 10*3/uL (ref 0.1–0.9)
Monocytes: 8 %
Neutrophils Absolute: 6 10*3/uL (ref 1.4–7.0)
Neutrophils: 74 %
Platelets: 249 10*3/uL (ref 150–450)
RBC: 3.64 x10E6/uL — ABNORMAL LOW (ref 3.77–5.28)
RDW: 12.1 % (ref 11.7–15.4)
WBC: 8.2 10*3/uL (ref 3.4–10.8)

## 2024-03-27 LAB — COMPREHENSIVE METABOLIC PANEL WITH GFR
ALT: 613 [IU]/L (ref 0–32)
AST: 684 [IU]/L (ref 0–40)
Albumin: 4 g/dL (ref 3.8–4.8)
Alkaline Phosphatase: 133 [IU]/L (ref 49–135)
BUN/Creatinine Ratio: 22 (ref 12–28)
BUN: 23 mg/dL (ref 8–27)
Bilirubin Total: 1.7 mg/dL — ABNORMAL HIGH (ref 0.0–1.2)
CO2: 23 mmol/L (ref 20–29)
Calcium: 9.4 mg/dL (ref 8.7–10.3)
Chloride: 103 mmol/L (ref 96–106)
Creatinine, Ser: 1.03 mg/dL — ABNORMAL HIGH (ref 0.57–1.00)
Globulin, Total: 2.4 g/dL (ref 1.5–4.5)
Glucose: 88 mg/dL (ref 70–99)
Sodium: 139 mmol/L (ref 134–144)
Total Protein: 6.4 g/dL (ref 6.0–8.5)
eGFR: 57 mL/min/{1.73_m2} — ABNORMAL LOW

## 2024-03-27 LAB — LIPID PANEL
Chol/HDL Ratio: 2.2 ratio (ref 0.0–4.4)
Cholesterol, Total: 145 mg/dL (ref 100–199)
HDL: 67 mg/dL
LDL Chol Calc (NIH): 65 mg/dL (ref 0–99)
Triglycerides: 61 mg/dL (ref 0–149)
VLDL Cholesterol Cal: 13 mg/dL (ref 5–40)

## 2024-03-30 ENCOUNTER — Ambulatory Visit: Payer: Self-pay | Admitting: Physician Assistant

## 2024-03-30 DIAGNOSIS — R7989 Other specified abnormal findings of blood chemistry: Secondary | ICD-10-CM

## 2024-03-31 NOTE — Progress Notes (Signed)
 Ok to recheck hepatic function panel this week--If LFTs remain elevated she will require further evaluation with additional lab work and a GI workup.

## 2024-03-31 NOTE — Telephone Encounter (Signed)
 Patient left a voicemail stating she has an appointment with PCP on 04/15/2024 and would like to know if she could wait until then to recheck labs. I discussed with [place] and called the patient back and advised to recheck labs this week. Patient verbalized understanding and will let us  know where to send the order.

## 2024-04-01 LAB — B12 AND FOLATE PANEL
Folate: >20 ng/mL
Vitamin B-12: >2000 pg/mL — ABNORMAL HIGH (ref 232–1245)

## 2024-04-01 LAB — SPECIMEN STATUS REPORT

## 2024-04-01 NOTE — Progress Notes (Signed)
 Folate WNL Vitamin B12 >2000.

## 2024-04-02 ENCOUNTER — Other Ambulatory Visit: Payer: Self-pay

## 2024-04-02 ENCOUNTER — Other Ambulatory Visit: Attending: Internal Medicine

## 2024-04-02 DIAGNOSIS — R799 Abnormal finding of blood chemistry, unspecified: Secondary | ICD-10-CM

## 2024-04-02 NOTE — Progress Notes (Cosign Needed)
 Patient in for hepatic function lab testing. Blood from previous lab work with high abnormal values, provider ordered lab work rechecked. Butterfly needle used to acquire blood necessary, with bandage placed after. Patient discharged via wheelchair to car with no issues.

## 2024-06-29 ENCOUNTER — Ambulatory Visit: Admitting: Physician Assistant

## 2025-01-26 ENCOUNTER — Ambulatory Visit: Admitting: Allergy and Immunology
